# Patient Record
Sex: Male | Born: 1965 | Race: Black or African American | Hispanic: No | Smoking: Current every day smoker
Health system: Southern US, Community
[De-identification: ages and names within clinical notes are randomized; demographics above are authoritative.]

## PROBLEM LIST (undated history)

## (undated) DIAGNOSIS — R079 Chest pain, unspecified: Secondary | ICD-10-CM

## (undated) DIAGNOSIS — R51 Headache: Secondary | ICD-10-CM

## (undated) DIAGNOSIS — J449 Chronic obstructive pulmonary disease, unspecified: Secondary | ICD-10-CM

## (undated) DIAGNOSIS — E7800 Pure hypercholesterolemia, unspecified: Secondary | ICD-10-CM

## (undated) DIAGNOSIS — I10 Essential (primary) hypertension: Secondary | ICD-10-CM

## (undated) DIAGNOSIS — G43909 Migraine, unspecified, not intractable, without status migrainosus: Secondary | ICD-10-CM

## (undated) DIAGNOSIS — R519 Headache, unspecified: Secondary | ICD-10-CM

## (undated) DIAGNOSIS — F1010 Alcohol abuse, uncomplicated: Secondary | ICD-10-CM

## (undated) HISTORY — PX: NO PAST SURGERIES: SHX2092

## (undated) HISTORY — DX: Chest pain, unspecified: R07.9

---

## 2001-06-29 ENCOUNTER — Emergency Department (HOSPITAL_COMMUNITY): Admission: EM | Admit: 2001-06-29 | Discharge: 2001-06-29 | Payer: Self-pay | Admitting: *Deleted

## 2002-06-01 ENCOUNTER — Emergency Department (HOSPITAL_COMMUNITY): Admission: EM | Admit: 2002-06-01 | Discharge: 2002-06-01 | Payer: Self-pay | Admitting: Emergency Medicine

## 2002-11-25 ENCOUNTER — Emergency Department (HOSPITAL_COMMUNITY): Admission: EM | Admit: 2002-11-25 | Discharge: 2002-11-25 | Payer: Self-pay | Admitting: Emergency Medicine

## 2006-01-28 ENCOUNTER — Emergency Department (HOSPITAL_COMMUNITY): Admission: EM | Admit: 2006-01-28 | Discharge: 2006-01-29 | Payer: Self-pay | Admitting: Emergency Medicine

## 2007-01-22 ENCOUNTER — Ambulatory Visit: Payer: Self-pay | Admitting: Internal Medicine

## 2007-01-26 ENCOUNTER — Ambulatory Visit: Payer: Self-pay | Admitting: *Deleted

## 2008-09-25 ENCOUNTER — Emergency Department (HOSPITAL_COMMUNITY): Admission: EM | Admit: 2008-09-25 | Discharge: 2008-09-25 | Payer: Self-pay | Admitting: Family Medicine

## 2014-07-07 ENCOUNTER — Encounter (HOSPITAL_COMMUNITY): Payer: Self-pay | Admitting: Emergency Medicine

## 2014-07-07 ENCOUNTER — Emergency Department (HOSPITAL_COMMUNITY)
Admission: EM | Admit: 2014-07-07 | Discharge: 2014-07-07 | Disposition: A | Discharge status: Home / Self Care | Payer: Self-pay | Attending: Emergency Medicine | Admitting: Emergency Medicine

## 2014-07-07 ENCOUNTER — Emergency Department (HOSPITAL_COMMUNITY): Payer: Self-pay

## 2014-07-07 DIAGNOSIS — Z720 Tobacco use: Secondary | ICD-10-CM | POA: Insufficient documentation

## 2014-07-07 DIAGNOSIS — R0789 Other chest pain: Secondary | ICD-10-CM | POA: Insufficient documentation

## 2014-07-07 DIAGNOSIS — R6883 Chills (without fever): Secondary | ICD-10-CM | POA: Insufficient documentation

## 2014-07-07 DIAGNOSIS — K529 Noninfective gastroenteritis and colitis, unspecified: Secondary | ICD-10-CM | POA: Insufficient documentation

## 2014-07-07 LAB — URINALYSIS, ROUTINE W REFLEX MICROSCOPIC
BILIRUBIN URINE: NEGATIVE
Glucose, UA: NEGATIVE mg/dL
HGB URINE DIPSTICK: NEGATIVE
Ketones, ur: NEGATIVE mg/dL
Leukocytes, UA: NEGATIVE
Nitrite: NEGATIVE
PH: 5.5 (ref 5.0–8.0)
Protein, ur: NEGATIVE mg/dL
SPECIFIC GRAVITY, URINE: 1.011 (ref 1.005–1.030)
Urobilinogen, UA: 0.2 mg/dL (ref 0.0–1.0)

## 2014-07-07 LAB — COMPREHENSIVE METABOLIC PANEL
ALBUMIN: 3.6 g/dL (ref 3.5–5.2)
ALK PHOS: 75 U/L (ref 39–117)
ALT: 17 U/L (ref 0–53)
AST: 30 U/L (ref 0–37)
Anion gap: 11 (ref 5–15)
BILIRUBIN TOTAL: 0.3 mg/dL (ref 0.3–1.2)
BUN: 14 mg/dL (ref 6–23)
CHLORIDE: 103 meq/L (ref 96–112)
CO2: 27 mEq/L (ref 19–32)
Calcium: 9.2 mg/dL (ref 8.4–10.5)
Creatinine, Ser: 0.78 mg/dL (ref 0.50–1.35)
GFR calc Af Amer: >90 mL/min (ref >90)
GFR calc non Af Amer: >90 mL/min (ref >90)
Glucose, Bld: 95 mg/dL (ref 70–99)
POTASSIUM: 5.1 meq/L (ref 3.7–5.3)
SODIUM: 141 meq/L (ref 137–147)
Total Protein: 7 g/dL (ref 6.0–8.3)

## 2014-07-07 LAB — CBC WITH DIFFERENTIAL/PLATELET
BASOS ABS: 0.1 10*3/uL (ref 0.0–0.1)
BASOS PCT: 1 % (ref 0–1)
Eosinophils Absolute: 0.4 10*3/uL (ref 0.0–0.7)
Eosinophils Relative: 4 % (ref 0–5)
HCT: 38.9 % — ABNORMAL LOW (ref 39.0–52.0)
HEMOGLOBIN: 13.5 g/dL (ref 13.0–17.0)
Lymphocytes Relative: 29 % (ref 12–46)
Lymphs Abs: 2.6 10*3/uL (ref 0.7–4.0)
MCH: 31.9 pg (ref 26.0–34.0)
MCHC: 34.7 g/dL (ref 30.0–36.0)
MCV: 92 fL (ref 78.0–100.0)
MONOS PCT: 10 % (ref 3–12)
Monocytes Absolute: 0.9 10*3/uL (ref 0.1–1.0)
NEUTROS ABS: 5.1 10*3/uL (ref 1.7–7.7)
NEUTROS PCT: 56 % (ref 43–77)
PLATELETS: 273 10*3/uL (ref 150–400)
RBC: 4.23 MIL/uL (ref 4.22–5.81)
RDW: 13.1 % (ref 11.5–15.5)
WBC: 9 10*3/uL (ref 4.0–10.5)

## 2014-07-07 LAB — LIPASE, BLOOD: Lipase: 32 U/L (ref 11–59)

## 2014-07-07 MED ORDER — HYDROMORPHONE HCL 1 MG/ML IJ SOLN
1.0000 mg | Freq: Once | INTRAMUSCULAR | Status: AC
  Administered 2014-07-07: 1 mg via INTRAVENOUS
  Filled 2014-07-07: qty 1

## 2014-07-07 MED ORDER — HYDROCODONE-ACETAMINOPHEN 5-325 MG PO TABS: 1.0000 | ORAL_TABLET | Freq: Four times a day (QID) | ORAL | Status: DC | PRN

## 2014-07-07 MED ORDER — IOHEXOL 300 MG/ML  SOLN
25.0000 mL | Freq: Once | INTRAMUSCULAR | Status: AC | PRN
Start: 2014-07-07 — End: 2014-07-07
  Administered 2014-07-07: 25 mL via ORAL

## 2014-07-07 MED ORDER — CIPROFLOXACIN HCL 500 MG PO TABS: 500.0000 mg | ORAL_TABLET | Freq: Two times a day (BID) | ORAL | Status: DC

## 2014-07-07 MED ORDER — SODIUM CHLORIDE 0.9 % IV BOLUS (SEPSIS)
1000.0000 mL | Freq: Once | INTRAVENOUS | Status: AC
  Administered 2014-07-07: 1000 mL via INTRAVENOUS

## 2014-07-07 MED ORDER — ONDANSETRON HCL 4 MG/2ML IJ SOLN
4.0000 mg | Freq: Once | INTRAMUSCULAR | Status: AC
  Administered 2014-07-07: 4 mg via INTRAVENOUS
  Filled 2014-07-07: qty 2

## 2014-07-07 MED ORDER — METRONIDAZOLE 500 MG PO TABS: 500.0000 mg | ORAL_TABLET | Freq: Two times a day (BID) | ORAL | Status: DC

## 2014-07-07 MED ORDER — IOHEXOL 300 MG/ML  SOLN
80.0000 mL | Freq: Once | INTRAMUSCULAR | Status: AC | PRN
  Administered 2014-07-07: 80 mL via INTRAVENOUS

## 2014-07-07 MED ORDER — METRONIDAZOLE 500 MG PO TABS
500.0000 mg | ORAL_TABLET | Freq: Once | ORAL | Status: AC
  Administered 2014-07-07: 500 mg via ORAL
  Filled 2014-07-07: qty 1

## 2014-07-07 MED ORDER — CIPROFLOXACIN HCL 500 MG PO TABS
500.0000 mg | ORAL_TABLET | Freq: Once | ORAL | Status: AC
  Administered 2014-07-07: 500 mg via ORAL
  Filled 2014-07-07: qty 1

## 2014-07-07 NOTE — Discharge Planning (Signed)
[place] Specialist  Spoke to patient regarding primary care resources and establishing care with a provider. Resource guide and my contact information provided for any future questions or concerns. No other [place] Specialist needs identified at this time.

## 2014-07-07 NOTE — Discharge Instructions (Signed)

## 2014-07-07 NOTE — ED Notes (Signed)
Pt. reports mid/low abdominal pain with emesis and diarrhea onset Wednesday this week , denies fever or chills. No dysuria .

## 2014-07-07 NOTE — ED Provider Notes (Signed)
CSN: [id number]     Arrival date & time 07/07/14  0606 History   First MD Initiated Contact with Patient 07/07/14 [phone]     Chief Complaint  Patient presents with  . Abdominal Pain     (Consider location/radiation/quality/duration/timing/severity/associated sxs/prior Treatment) HPI Patient presents with new abdominal pain.  Onset was 2 days ago, without clear precipitant. Since onset patient has a severe pain, worse with exacerbations.  The pain is focally in the lower abdomen, though it radiates diffusely. No relief with Pepto-Bismol or other OTC medication. There is associated chills, one episode of emesis on the day of symptoms onset, though none since. There is associated diarrhea, yellow. No new dyspnea. Occasionally patient also generalized discomfort, including 2 chest discomfort, though not consistently, nor with exertion or deep inspiration.  Patient works as a Training and development officer in [employer].  He smokes, drinks.  I discussed smoking cessation with the patient.   History reviewed. No pertinent past medical history. History reviewed. No pertinent past surgical history. No family history on file. History  Substance Use Topics  . Smoking status: Current Every Day Smoker  . Smokeless tobacco: Not on file  . Alcohol Use: Yes    Review of Systems  Constitutional:       Per HPI, otherwise negative  HENT:       Per HPI, otherwise negative  Respiratory:       Per HPI, otherwise negative  Cardiovascular:       Per HPI, otherwise negative  Gastrointestinal: Positive for nausea, vomiting, abdominal pain and diarrhea. Negative for blood in stool.  Endocrine:       Negative aside from HPI  Genitourinary:       Neg aside from HPI   Musculoskeletal:       Per HPI, otherwise negative  Skin: Negative.   Neurological: Negative for syncope.      Allergies  Review of patient's allergies indicates no known allergies.  Home Medications   Prior to Admission medications   Not on  File   BP 182/87  Pulse 71  Temp(Src) 97.7 F (36.5 C) (Oral)  Resp 14  Ht 5\' 9"  (1.753 m)  Wt 160 lb (72.576 kg)  BMI 23.62 kg/m2  SpO2 100% Physical Exam  Nursing note and vitals reviewed. Constitutional: He is oriented to person, place, and time. He appears well-developed. No distress.  HENT:  Head: Normocephalic and atraumatic.  Eyes: Conjunctivae and EOM are normal.  Cardiovascular: Normal rate and regular rhythm.   Pulmonary/Chest: Effort normal. No stridor. No respiratory distress.  Abdominal: He exhibits no distension. There is generalized tenderness. There is guarding. There is no rigidity and no rebound.  Musculoskeletal: He exhibits no edema.  Neurological: He is alert and oriented to person, place, and time.  Skin: Skin is warm and dry.  Psychiatric: He has a normal mood and affect.    ED Course  Procedures (including critical care time) Labs Review Labs Reviewed  CBC WITH DIFFERENTIAL - Abnormal; Notable for the following:    HCT 38.9 (*)    All other components within normal limits  COMPREHENSIVE METABOLIC PANEL  LIPASE, BLOOD  URINALYSIS, ROUTINE W REFLEX MICROSCOPIC    Imaging Review Ct Abdomen Pelvis W Contrast  07/07/2014   CLINICAL DATA:  Two day history of bilateral lower abdominal pain and diarrhea  EXAM: CT ABDOMEN AND PELVIS WITH CONTRAST  TECHNIQUE: Multidetector CT imaging of the abdomen and pelvis was performed using the standard protocol following bolus administration of intravenous  contrast. Oral contrast was also administered.  CONTRAST:  63mL OMNIPAQUE IOHEXOL 300 MG/ML  SOLN  COMPARISON:  None.  FINDINGS: Lung bases are clear.  There is a small hiatal hernia.  Liver is prominent measuring 17.8 cm in length. There is a 4 mm presumed cyst in the anterior segment right lobe of the liver. No other focal liver lesion is identified. Gallbladder wall is not thickened. There is no biliary duct dilatation.  Spleen, pancreas, and adrenals appear normal.   Kidneys bilaterally show no mass, calculus, or hydronephrosis on either side. There is no ureteral calculus or ureterectasis on either side.  The urinary bladder is distended. The urinary bladder wall thickness is normal. There is a small amount of free fluid in the dependent portion of the pelvis. There is no pelvic mass. The appendix appears normal.  There is no bowel obstruction.  No free air or portal venous air.  There is mild fold thickening at several sites in the proximal transverse colon and proximal descending colon consistent with nonspecific colitis. There is no appreciable small bowel thickening.  There is no adenopathy or abscess in the abdomen or pelvis. There is atherosclerotic change in the aorta and iliac arteries. There is no appreciable abdominal aortic aneurysm. There are no blastic or lytic bone lesions.  IMPRESSION: Areas of relatively mild colonic wall thickening, probably representing nonspecific colitis. There is no surrounding mesenteric thickening or abscess. No fistulae are seen. No small bowel dilatation. No bowel obstruction. Appendix appears normal.  Small amount of ascites in the dependent portion of the pelvis. No other ascites.  Liver prominent. No focal liver lesions beyond a tiny presumed cyst in the right lobe.  Small hiatal hernia.  Urinary bladder is distended. Clinical significance of this finding is uncertain. Prostate does not appear enlarged on this study.   Electronically Signed   By: Lowella Grip M.D.   On: 07/07/2014 10:43     EKG Interpretation   Date/Time:  Thursday July 07 2014 18:84:16 EDT Ventricular Rate:  52 PR Interval:  149 QRS Duration: 94 QT Interval:  467 QTC Calculation: 434 R Axis:   79 Text Interpretation:  Sinus rhythm Left ventricular hypertrophy Inferior  infarct, acute (LCx) Lateral leads are also involved Sinus rhythm Left  ventricular hypertrophy diffuse ST changes consistent with prior study  Abnormal ekg Confirmed by  Carmin Muskrat  MD ([id number]) on 07/07/2014 7:31:06  AM     12:07 PM Patient appears calm on repeat exam.  He is aware of all results. MDM   Patient presents with new lower abdominal pain, diarrhea.  The patient has an abnormal ECG, but consistent with prior studies.  No CP throughout the patient's ED course. Pain improved. CT c/w colitis, which is consistent with the patient's Sx. Absent f/c, distress, the patient was d/c w ABX, analgesics, GI f/u.    Carmin Muskrat, MD 07/07/14 1209

## 2014-07-07 NOTE — ED Notes (Signed)
No symptoms of diarrhea since pt.s arrival

## 2014-07-25 ENCOUNTER — Emergency Department (HOSPITAL_COMMUNITY)
Admission: EM | Admit: 2014-07-25 | Discharge: 2014-07-25 | Disposition: A | Discharge status: Home / Self Care | Payer: Self-pay | Attending: Emergency Medicine | Admitting: Emergency Medicine

## 2014-07-25 ENCOUNTER — Emergency Department (HOSPITAL_COMMUNITY): Payer: Self-pay

## 2014-07-25 ENCOUNTER — Encounter (HOSPITAL_COMMUNITY): Payer: Self-pay | Admitting: Emergency Medicine

## 2014-07-25 DIAGNOSIS — Z720 Tobacco use: Secondary | ICD-10-CM | POA: Insufficient documentation

## 2014-07-25 DIAGNOSIS — Z8719 Personal history of other diseases of the digestive system: Secondary | ICD-10-CM | POA: Insufficient documentation

## 2014-07-25 DIAGNOSIS — R61 Generalized hyperhidrosis: Secondary | ICD-10-CM | POA: Insufficient documentation

## 2014-07-25 DIAGNOSIS — R112 Nausea with vomiting, unspecified: Secondary | ICD-10-CM | POA: Insufficient documentation

## 2014-07-25 DIAGNOSIS — M79605 Pain in left leg: Secondary | ICD-10-CM | POA: Insufficient documentation

## 2014-07-25 DIAGNOSIS — Z7982 Long term (current) use of aspirin: Secondary | ICD-10-CM | POA: Insufficient documentation

## 2014-07-25 DIAGNOSIS — R0789 Other chest pain: Secondary | ICD-10-CM | POA: Insufficient documentation

## 2014-07-25 DIAGNOSIS — R1012 Left upper quadrant pain: Secondary | ICD-10-CM | POA: Insufficient documentation

## 2014-07-25 LAB — CBC
HCT: 37.8 % — ABNORMAL LOW (ref 39.0–52.0)
HEMOGLOBIN: 13.2 g/dL (ref 13.0–17.0)
MCH: 32 pg (ref 26.0–34.0)
MCHC: 34.9 g/dL (ref 30.0–36.0)
MCV: 91.7 fL (ref 78.0–100.0)
Platelets: 259 10*3/uL (ref 150–400)
RBC: 4.12 MIL/uL — AB (ref 4.22–5.81)
RDW: 12.7 % (ref 11.5–15.5)
WBC: 6.7 10*3/uL (ref 4.0–10.5)

## 2014-07-25 LAB — LIPASE, BLOOD: Lipase: 42 U/L (ref 11–59)

## 2014-07-25 LAB — HEPATIC FUNCTION PANEL
ALT: 21 U/L (ref 0–53)
AST: 41 U/L — ABNORMAL HIGH (ref 0–37)
Albumin: 3.8 g/dL (ref 3.5–5.2)
Alkaline Phosphatase: 67 U/L (ref 39–117)
Bilirubin, Direct: <0.2 mg/dL (ref 0.0–0.3)
Total Bilirubin: 0.4 mg/dL (ref 0.3–1.2)
Total Protein: 7.2 g/dL (ref 6.0–8.3)

## 2014-07-25 LAB — I-STAT TROPONIN, ED: Troponin i, poc: 0.01 ng/mL (ref 0.00–0.08)

## 2014-07-25 LAB — POC OCCULT BLOOD, ED: FECAL OCCULT BLD: NEGATIVE

## 2014-07-25 LAB — PRO B NATRIURETIC PEPTIDE: PRO B NATRI PEPTIDE: 43.1 pg/mL (ref 0–125)

## 2014-07-25 LAB — BASIC METABOLIC PANEL
ANION GAP: 14 (ref 5–15)
BUN: 13 mg/dL (ref 6–23)
CALCIUM: 9.3 mg/dL (ref 8.4–10.5)
CHLORIDE: 97 meq/L (ref 96–112)
CO2: 25 meq/L (ref 19–32)
CREATININE: 0.87 mg/dL (ref 0.50–1.35)
GFR calc Af Amer: >90 mL/min (ref >90)
GFR calc non Af Amer: >90 mL/min (ref >90)
GLUCOSE: 102 mg/dL — AB (ref 70–99)
Potassium: 4.2 mEq/L (ref 3.7–5.3)
SODIUM: 136 meq/L — AB (ref 137–147)

## 2014-07-25 LAB — TROPONIN I

## 2014-07-25 MED ORDER — GI COCKTAIL ~~LOC~~
30.0000 mL | Freq: Once | ORAL | Status: AC
  Administered 2014-07-25: 30 mL via ORAL
  Filled 2014-07-25: qty 30

## 2014-07-25 MED ORDER — LANSOPRAZOLE 30 MG PO CPDR: 30.0000 mg | DELAYED_RELEASE_CAPSULE | Freq: Every day | ORAL | Status: DC

## 2014-07-25 NOTE — ED Notes (Signed)
Pending lab results, NAD, calm, resting, denies change, "feels the same".

## 2014-07-25 NOTE — ED Notes (Signed)
C/o L axilla pain, radiates across L chest and down L side down to foot, also states, "L hand and foot/toes cramping". Admits to some sob (denies: nvd, fever, cough, congestion, cold sx or dizziness).  Alert, NAD, calm, interactive, no dyspnea noted.

## 2014-07-25 NOTE — ED Provider Notes (Signed)
CSN: [id number]     Arrival date & time 07/25/14  0217 History   First MD Initiated Contact with Patient 07/25/14 [phone]     Chief Complaint  Patient presents with  . Chest Pain  . Abdominal Pain  . Leg Pain     (Consider location/radiation/quality/duration/timing/severity/associated sxs/prior Treatment) HPI Patient was seen and treated for colitis roughly 3 weeks ago. He states that all medication as prescribed. Roughly one week of diagnosis began having left-sided abdominal pain that radiated up into the left chest and down the left leg. He's had mild nausea and episodic vomiting. Vomiting is nonbloody and nonbilious. Patient does admit to dark stools with occasional gross blood. He denies any fevers or chills. Patient with a urinary symptoms including hematuria, dysuria, frequency or urgency. Patient admits to heavy alcohol consumption as well as Ibuprofen and Goody powders. No lower extremity swelling or pain. No recent extended travel or surgeries. History reviewed. No pertinent past medical history. History reviewed. No pertinent past surgical history. No family history on file. History  Substance Use Topics  . Smoking status: Current Every Day Smoker  . Smokeless tobacco: Not on file  . Alcohol Use: Yes    Review of Systems  Constitutional: Positive for diaphoresis. Negative for fever and chills.  Respiratory: Negative for cough and shortness of breath.   Cardiovascular: Positive for chest pain. Negative for palpitations and leg swelling.  Gastrointestinal: Positive for nausea, vomiting, abdominal pain and anal bleeding. Negative for diarrhea, constipation and blood in stool.  Genitourinary: Negative for dysuria and frequency.  Musculoskeletal: Negative for back pain, myalgias, neck pain and neck stiffness.  Skin: Negative for rash and wound.  Neurological: Negative for dizziness, seizures, weakness, numbness and headaches.  All other systems reviewed and are  negative.     Allergies  Review of patient's allergies indicates no known allergies.  Home Medications   Prior to Admission medications   Medication Sig Start Date End Date Taking? Authorizing Provider  aspirin EC 81 MG tablet Take 162 mg by mouth every 6 (six) hours as needed for mild pain.   Yes Historical Provider, MD  lansoprazole (PREVACID) 30 MG capsule Take 1 capsule (30 mg total) by mouth daily at 12 noon. 07/25/14   Julianne Rice, MD   BP 105/80  Pulse 64  Temp(Src) 99 F (37.2 C) (Oral)  Resp 22  Ht 5\' 9"  (1.753 m)  Wt 145 lb (65.772 kg)  BMI 21.40 kg/m2  SpO2 90% Physical Exam  Nursing note and vitals reviewed. Constitutional: He is oriented to person, place, and time. He appears well-developed and well-nourished. No distress.  HENT:  Head: Normocephalic and atraumatic.  Mouth/Throat: Oropharynx is clear and moist. No oropharyngeal exudate.  Eyes: EOM are normal. Pupils are equal, round, and reactive to light.  Neck: Normal range of motion. Neck supple.  Cardiovascular: Normal rate and regular rhythm.   Pulmonary/Chest: Effort normal and breath sounds normal. No respiratory distress. He has no wheezes. He has no rales. He exhibits tenderness (chest tenderness is completely reproduced with palpation over the left chest wall. There is no crepitance or deformity.).  Abdominal: Soft. Bowel sounds are normal. There is tenderness (mild left upper abdomen tenderness with palpation. No rebound or guarding.).  Musculoskeletal: Normal range of motion. He exhibits no edema and no tenderness.  Neurological: He is alert and oriented to person, place, and time.  Skin: Skin is warm and dry. No rash noted. No erythema.  Psychiatric: He has a normal mood  and affect. His behavior is normal.    ED Course  Procedures (including critical care time) Labs Review Labs Reviewed  CBC - Abnormal; Notable for the following:    RBC 4.12 (*)    HCT 37.8 (*)    All other components  within normal limits  BASIC METABOLIC PANEL - Abnormal; Notable for the following:    Sodium 136 (*)    Glucose, Bld 102 (*)    All other components within normal limits  HEPATIC FUNCTION PANEL - Abnormal; Notable for the following:    AST 41 (*)    All other components within normal limits  PRO B NATRIURETIC PEPTIDE  LIPASE, BLOOD  TROPONIN I  I-STAT TROPOININ, ED  POC OCCULT BLOOD, ED    Imaging Review Dg Chest 2 View  07/25/2014   CLINICAL DATA:  LEFT lateral chest pain, and painful swallowing beginning July 18, 2014.  EXAM: CHEST  2 VIEW  COMPARISON:  Chest radiograph September 25, 2008  FINDINGS: Cardiomediastinal silhouette is unremarkable. The lungs are clear without pleural effusions or focal consolidations. Trachea projects midline and there is no pneumothorax. Soft tissue planes and included osseous structures are non-suspicious.  IMPRESSION: No acute cardiopulmonary process ; normal chest radiograph.   Electronically Signed   By: Elon Alas   On: 07/25/2014 02:56     EKG Interpretation None      MDM   Final diagnoses:  Chest wall pain  Left upper quadrant pain    Patient presents with atypical left-sided chest pain and left abdominal pain. States he's had a drinker and uses multiple NSAIDs. Suspect this is gastritis related. Chest pain is reproduced with palpation. EKG without acute changes. Troponin x2 is normal. Patient's workup is otherwise normal. Guaiac-negative. Advised to avoid NSAIDs and decrease alcohol consumption. Will also start on PPI. Patient will need to followup with gastroenterology. He can give her return precautions and is voiced understanding.    Julianne Rice, MD 07/26/14 [place]

## 2014-07-25 NOTE — Discharge Instructions (Signed)
Abdominal Pain Many things can cause abdominal pain. Usually, abdominal pain is not caused by a disease and will improve without treatment. It can often be observed and treated at home. Your health care provider will do a physical exam and possibly order blood tests and X-rays to help determine the seriousness of your pain. However, in many cases, more time must pass before a clear cause of the pain can be found. Before that point, your health care provider may not know if you need more testing or further treatment. HOME CARE INSTRUCTIONS  Monitor your abdominal pain for any changes. The following actions may help to alleviate any discomfort you are experiencing:  Only take over-the-counter or prescription medicines as directed by your health care provider.  Do not take laxatives unless directed to do so by your health care provider.  Try a clear liquid diet (broth, tea, or water) as directed by your health care provider. Slowly move to a bland diet as tolerated. SEEK MEDICAL CARE IF:  You have unexplained abdominal pain.  You have abdominal pain associated with nausea or diarrhea.  You have pain when you urinate or have a bowel movement.  You experience abdominal pain that wakes you in the night.  You have abdominal pain that is worsened or improved by eating food.  You have abdominal pain that is worsened with eating fatty foods.  You have a fever. SEEK IMMEDIATE MEDICAL CARE IF:   Your pain does not go away within 2 hours.  You keep throwing up (vomiting).  Your pain is felt only in portions of the abdomen, such as the right side or the left lower portion of the abdomen.  You pass bloody or black tarry stools. MAKE SURE YOU:  Understand these instructions.   Will watch your condition.   Will get help right away if you are not doing well or get worse.  Document Released: 07/03/2005 Document Revised: 09/28/2013 Document Reviewed: 06/02/2013 [facility] Patient Information  2015 [place], [place]. This information is not intended to replace advice given to you by your health care provider. Make sure you discuss any questions you have with your health care provider.  Chest Wall Pain Chest wall pain is pain in or around the bones and muscles of your chest. It may take up to 6 weeks to get better. It may take longer if you must stay physically active in your work and activities.  CAUSES  Chest wall pain may happen on its own. However, it may be caused by:  A viral illness like the flu.  Injury.  Coughing.  Exercise.  Arthritis.  Fibromyalgia.  Shingles. HOME CARE INSTRUCTIONS   Avoid overtiring physical activity. Try not to strain or perform activities that cause pain. This includes any activities using your chest or your abdominal and side muscles, especially if heavy weights are used.  Put ice on the sore area.  Put ice in a plastic bag.  Place a towel between your skin and the bag.  Leave the ice on for 15-20 minutes per hour while awake for the first 2 days.  Only take over-the-counter or prescription medicines for pain, discomfort, or fever as directed by your caregiver. SEEK IMMEDIATE MEDICAL CARE IF:   Your pain increases, or you are very uncomfortable.  You have a fever.  Your chest pain becomes worse.  You have new, unexplained symptoms.  You have nausea or vomiting.  You feel sweaty or lightheaded.  You have a cough with phlegm (sputum), or you  cough up blood. MAKE SURE YOU:   Understand these instructions.  Will watch your condition.  Will get help right away if you are not doing well or get worse. Document Released: 09/23/2005 Document Revised: 12/16/2011 Document Reviewed: 05/20/2011 [facility] Patient Information 2015 [place], [place]. This information is not intended to replace advice given to you by your health care provider. Make sure you discuss any questions you have with your health care provider.

## 2014-07-25 NOTE — ED Notes (Signed)
Dr. Yelverton at [facility] 

## 2014-07-25 NOTE — ED Notes (Signed)
Reports pain that radiates from L shoulder to L toes.  States pain radiates into L side of chest, L side of abd, and down L leg.  Reports nausea, vomiting (in the mornings), sob, diaphoresis, night sweats, and dark tarry stools since 10/12.  Also reports feeling bloated after eating a small amount.

## 2014-07-25 NOTE — ED Notes (Signed)
pt alert, NAD, calm, interactive, resps e/u, speaking in clear complete sentences, steady gait, no dyspnea noted. Back to room from w/r.

## 2014-08-25 ENCOUNTER — Inpatient Hospital Stay: Payer: Self-pay | Admitting: Internal Medicine

## 2015-11-09 ENCOUNTER — Emergency Department (HOSPITAL_COMMUNITY)
Admission: EM | Admit: 2015-11-09 | Discharge: 2015-11-09 | Disposition: A | Discharge status: Home / Self Care | Payer: Self-pay | Attending: Emergency Medicine | Admitting: Emergency Medicine

## 2015-11-09 ENCOUNTER — Encounter (HOSPITAL_COMMUNITY): Payer: Self-pay

## 2015-11-09 ENCOUNTER — Emergency Department (HOSPITAL_COMMUNITY): Payer: Self-pay

## 2015-11-09 DIAGNOSIS — F17200 Nicotine dependence, unspecified, uncomplicated: Secondary | ICD-10-CM | POA: Insufficient documentation

## 2015-11-09 DIAGNOSIS — K529 Noninfective gastroenteritis and colitis, unspecified: Secondary | ICD-10-CM | POA: Insufficient documentation

## 2015-11-09 DIAGNOSIS — R1084 Generalized abdominal pain: Secondary | ICD-10-CM | POA: Insufficient documentation

## 2015-11-09 DIAGNOSIS — Z7982 Long term (current) use of aspirin: Secondary | ICD-10-CM | POA: Insufficient documentation

## 2015-11-09 DIAGNOSIS — Z79899 Other long term (current) drug therapy: Secondary | ICD-10-CM | POA: Insufficient documentation

## 2015-11-09 LAB — COMPREHENSIVE METABOLIC PANEL
ALT: 110 U/L — ABNORMAL HIGH (ref 17–63)
ANION GAP: 15 (ref 5–15)
AST: 166 U/L — ABNORMAL HIGH (ref 15–41)
Albumin: 3.7 g/dL (ref 3.5–5.0)
Alkaline Phosphatase: 85 U/L (ref 38–126)
BUN: 16 mg/dL (ref 6–20)
CO2: 25 mmol/L (ref 22–32)
Calcium: 9.4 mg/dL (ref 8.9–10.3)
Chloride: 99 mmol/L — ABNORMAL LOW (ref 101–111)
Creatinine, Ser: 1.46 mg/dL — ABNORMAL HIGH (ref 0.61–1.24)
GFR calc non Af Amer: 54 mL/min — ABNORMAL LOW (ref >60)
Glucose, Bld: 118 mg/dL — ABNORMAL HIGH (ref 65–99)
Potassium: 4.4 mmol/L (ref 3.5–5.1)
SODIUM: 139 mmol/L (ref 135–145)
TOTAL PROTEIN: 6.6 g/dL (ref 6.5–8.1)
Total Bilirubin: 1.6 mg/dL — ABNORMAL HIGH (ref 0.3–1.2)

## 2015-11-09 LAB — URINALYSIS, ROUTINE W REFLEX MICROSCOPIC
Bilirubin Urine: NEGATIVE
Glucose, UA: NEGATIVE mg/dL
HGB URINE DIPSTICK: NEGATIVE
Ketones, ur: NEGATIVE mg/dL
LEUKOCYTES UA: NEGATIVE
NITRITE: NEGATIVE
PROTEIN: 30 mg/dL — AB
SPECIFIC GRAVITY, URINE: 1.013 (ref 1.005–1.030)
pH: 6 (ref 5.0–8.0)

## 2015-11-09 LAB — URINE MICROSCOPIC-ADD ON

## 2015-11-09 LAB — CBC
HCT: 39.6 % (ref 39.0–52.0)
HEMOGLOBIN: 14 g/dL (ref 13.0–17.0)
MCH: 32.8 pg (ref 26.0–34.0)
MCHC: 35.4 g/dL (ref 30.0–36.0)
MCV: 92.7 fL (ref 78.0–100.0)
Platelets: 208 10*3/uL (ref 150–400)
RBC: 4.27 MIL/uL (ref 4.22–5.81)
RDW: 12.7 % (ref 11.5–15.5)
WBC: 7.9 10*3/uL (ref 4.0–10.5)

## 2015-11-09 LAB — LIPASE, BLOOD: Lipase: 25 U/L (ref 11–51)

## 2015-11-09 MED ORDER — SODIUM CHLORIDE 0.9 % IV BOLUS (SEPSIS)
2000.0000 mL | Freq: Once | INTRAVENOUS | Status: AC
  Administered 2015-11-09: 2000 mL via INTRAVENOUS

## 2015-11-09 MED ORDER — FENTANYL CITRATE (PF) 100 MCG/2ML IJ SOLN
100.0000 ug | Freq: Once | INTRAMUSCULAR | Status: AC
  Administered 2015-11-09: 100 ug via INTRAVENOUS
  Filled 2015-11-09: qty 2

## 2015-11-09 MED ORDER — PROMETHAZINE HCL 25 MG/ML IJ SOLN
25.0000 mg | Freq: Once | INTRAMUSCULAR | Status: AC
  Administered 2015-11-09: 25 mg via INTRAVENOUS
  Filled 2015-11-09: qty 1

## 2015-11-09 MED ORDER — PROMETHAZINE HCL 25 MG PO TABS: 25.0000 mg | ORAL_TABLET | Freq: Three times a day (TID) | ORAL | Status: DC | PRN

## 2015-11-09 MED ORDER — IOHEXOL 300 MG/ML  SOLN
100.0000 mL | Freq: Once | INTRAMUSCULAR | Status: AC | PRN
  Administered 2015-11-09: 100 mL via INTRAVENOUS

## 2015-11-09 NOTE — ED Provider Notes (Signed)
CSN: [id number]     Arrival date & time 11/09/15  1334 History   First MD Initiated Contact with Patient 11/09/15 1548     Chief Complaint  Patient presents with  . Emesis     (Consider location/radiation/quality/duration/timing/severity/associated sxs/prior Treatment) HPI Patient presents to the emergency department with nausea, vomiting, diarrhea and abdominal pain that started yesterday morning.  The patient states that he is a 24 hours worth of nausea, vomiting, diarrhea, also with abdominal pain this morning.  Patient states that he did not take any medications prior to arrival.  Patient states that palpation makes the abdominal pain worse.  The patient denies chest pain, shortness of breath, fever, weakness, dizziness, dysuria, incontinence, bloody stool, hematemesis, back pain, neck pain, edema, rash, or syncope.  The patient states that he is a fairly heavy drinker History reviewed. No pertinent past medical history. History reviewed. No pertinent past surgical history. No family history on file. Social History  Substance Use Topics  . Smoking status: Current Every Day Smoker  . Smokeless tobacco: None  . Alcohol Use: Yes    Review of Systems All other systems negative except as documented in the HPI. All pertinent positives and negatives as reviewed in the HPI.   Allergies  Review of patient's allergies indicates no known allergies.  Home Medications   Prior to Admission medications   Medication Sig Start Date End Date Taking? Authorizing Provider  aspirin EC 81 MG tablet Take 162 mg by mouth every 6 (six) hours as needed for mild pain.    Historical Provider, MD  lansoprazole (PREVACID) 30 MG capsule Take 1 capsule (30 mg total) by mouth daily at 12 noon. 07/25/14   Julianne Rice, MD   BP 177/91 mmHg  Pulse 48  Temp(Src) 97.8 F (36.6 C) (Oral)  Resp 16  Ht 5\' 8"  (1.727 m)  Wt 58.968 kg  BMI 19.77 kg/m2  SpO2 100% Physical Exam  Constitutional: He is oriented  to person, place, and time. He appears well-developed and well-nourished. No distress.  HENT:  Head: Normocephalic and atraumatic.  Mouth/Throat: Oropharynx is clear and moist.  Eyes: Pupils are equal, round, and reactive to light.  Neck: Normal range of motion. Neck supple.  Cardiovascular: Normal rate, regular rhythm and normal heart sounds.  Exam reveals no gallop and no friction rub.   No murmur heard. Pulmonary/Chest: Effort normal and breath sounds normal. No respiratory distress. He has no wheezes.  Abdominal: Soft. Normal appearance and bowel sounds are normal. He exhibits no distension. There is generalized tenderness. There is no rebound and no guarding.  Neurological: He is alert and oriented to person, place, and time. He exhibits normal muscle tone. Coordination normal.  Skin: Skin is warm and dry. No rash noted. No erythema.  Psychiatric: He has a normal mood and affect. His behavior is normal.  Nursing note and vitals reviewed.   ED Course  Procedures (including critical care time) Labs Review Labs Reviewed  COMPREHENSIVE METABOLIC PANEL - Abnormal; Notable for the following:    Chloride 99 (*)    Glucose, Bld 118 (*)    Creatinine, Ser 1.46 (*)    AST 166 (*)    ALT 110 (*)    Total Bilirubin 1.6 (*)    GFR calc non Af Amer 54 (*)    All other components within normal limits  URINALYSIS, ROUTINE W REFLEX MICROSCOPIC (NOT AT [facility]) - Abnormal; Notable for the following:    Color, Urine AMBER (*)  APPearance CLOUDY (*)    Protein, ur 30 (*)    All other components within normal limits  URINE MICROSCOPIC-ADD ON - Abnormal; Notable for the following:    Squamous Epithelial / LPF 0-5 (*)    Bacteria, UA RARE (*)    Casts HYALINE CASTS (*)    All other components within normal limits  LIPASE, BLOOD  CBC    Imaging Review Ct Abdomen Pelvis W Contrast  11/09/2015  CLINICAL DATA:  Nausea and vomiting for 1 day.  Initial encounter. EXAM: CT ABDOMEN AND PELVIS WITH  CONTRAST TECHNIQUE: Multidetector CT imaging of the abdomen and pelvis was performed using the standard protocol following bolus administration of intravenous contrast. CONTRAST:  100 mL OMNIPAQUE IOHEXOL 300 MG/ML  SOLN COMPARISON:  CT abdomen and pelvis 07/07/2014. FINDINGS: The lung bases are clear.  No pleural or pericardial effusion. The liver is diffusely low attenuating consistent with fatty infiltration. No focal liver lesion is identified. Small left renal cyst is noted. The right kidney is unremarkable. The spleen, adrenal glands and pancreas appear normal. The biliary tree is unremarkable. There is incomplete visualization of air in the area of the perineum. A small fluid collection is seen in the axial plane only to the right of midline and measures 1.6 x 0.9 cm on image 87. There is advanced for age aortoiliac atherosclerosis. No lymphadenopathy is identified. The stomach, small and large bowel and appendix appear normal. No bony abnormality is identified. IMPRESSION: Possible soft tissue gas in the perineum with a small fluid collection just to the right of midline. This area is incompletely imaged. Recommend clinical correlation with physical examination. No acute abnormality within the abdomen or pelvis. Fatty infiltration of the liver. Advanced for age atherosclerosis. Electronically Signed   By: Inge Rise M.D.   On: 11/09/2015 18:51   I have personally reviewed and evaluated these images and lab results as part of my medical decision-making.  Patient will be treated for gastroenteritis.  Patient is advised to slowly increase his fluid intake, rest as much possible.  Patient agrees the plan and all questions were answered.  He is feeling considerably better following IV fluids and medications.  Told to return here as needed  Dalia Heading, PA-C 11/09/15 [place] Liu, MD 11/10/15 [phone]

## 2015-11-09 NOTE — ED Notes (Signed)
Patient here with general aching and lower abdominal pain x 1 day, vomiting and diarrhea with same

## 2015-11-09 NOTE — Discharge Instructions (Signed)
Return here as needed.  Slowly increase her fluid intake, rest as much as possible    Food Choices to Help Relieve Diarrhea, Adult When you have diarrhea, the foods you eat and your eating habits are very important. Choosing the right foods and drinks can help relieve diarrhea. Also, because diarrhea can last up to 7 days, you need to replace lost fluids and electrolytes (such as sodium, potassium, and chloride) in order to help prevent dehydration.  WHAT GENERAL GUIDELINES DO I NEED TO FOLLOW?  Slowly drink 1 cup (8 oz) of fluid for each episode of diarrhea. If you are getting enough fluid, your urine will be clear or pale yellow.  Eat starchy foods. Some good choices include white rice, white toast, pasta, low-fiber cereal, baked potatoes (without the skin), saltine crackers, and bagels.  Avoid large servings of any cooked vegetables.  Limit fruit to two servings per day. A serving is  cup or 1 small piece.  Choose foods with less than 2 g of fiber per serving.  Limit fats to less than 8 tsp (38 g) per day.  Avoid fried foods.  Eat foods that have probiotics in them. Probiotics can be found in certain dairy products.  Avoid foods and beverages that may increase the speed at which food moves through the stomach and intestines (gastrointestinal tract). Things to avoid include:  High-fiber foods, such as dried fruit, raw fruits and vegetables, nuts, seeds, and whole grain foods.  Spicy foods and high-fat foods.  Foods and beverages sweetened with high-fructose corn syrup, honey, or sugar alcohols such as xylitol, sorbitol, and mannitol. WHAT FOODS ARE RECOMMENDED? Grains White rice. White, [place], or pita breads (fresh or toasted), including plain rolls, buns, or bagels. White pasta. Saltine, soda, or graham crackers. Pretzels. Low-fiber cereal. Cooked cereals made with water (such as cornmeal, farina, or cream cereals). Plain muffins. Matzo. Melba toast. Zwieback.   Vegetables Potatoes (without the skin). Strained tomato and vegetable juices. Most well-cooked and canned vegetables without seeds. Tender lettuce. Fruits Cooked or canned applesauce, apricots, cherries, fruit cocktail, grapefruit, peaches, pears, or plums. Fresh bananas, apples without skin, cherries, grapes, cantaloupe, grapefruit, peaches, oranges, or plums.  Meat and Other Protein Products Baked or boiled chicken. Eggs. Tofu. Fish. Seafood. Smooth peanut butter. Ground or well-cooked tender beef, ham, veal, lamb, pork, or poultry.  Dairy Plain yogurt, kefir, and unsweetened liquid yogurt. Lactose-free milk, buttermilk, or soy milk. Plain hard cheese. Beverages Sport drinks. Clear broths. Diluted fruit juices (except prune). Regular, caffeine-free sodas such as ginger ale. Water. Decaffeinated teas. Oral rehydration solutions. Sugar-free beverages not sweetened with sugar alcohols. Other Bouillon, broth, or soups made from recommended foods.  The items listed above may not be a complete list of recommended foods or beverages. Contact your dietitian for more options. WHAT FOODS ARE NOT RECOMMENDED? Grains Whole grain, whole wheat, bran, or rye breads, rolls, pastas, crackers, and cereals. Wild or brown rice. Cereals that contain more than 2 g of fiber per serving. Corn tortillas or taco shells. Cooked or dry oatmeal. Granola. Popcorn. Vegetables Raw vegetables. Cabbage, broccoli, Brussels sprouts, artichokes, baked beans, beet greens, corn, kale, legumes, peas, sweet potatoes, and yams. Potato skins. Cooked spinach and cabbage. Fruits Dried fruit, including raisins and dates. Raw fruits. Stewed or dried prunes. Fresh apples with skin, apricots, mangoes, pears, raspberries, and strawberries.  Meat and Other Protein Products Chunky peanut butter. Nuts and seeds. Beans and lentils. Berniece Salines.  Dairy High-fat cheeses. Milk, chocolate milk, and beverages made  with milk, such as milk shakes. Cream.  Ice cream. Sweets and Desserts Sweet rolls, doughnuts, and sweet breads. Pancakes and waffles. Fats and Oils Butter. Cream sauces. Margarine. Salad oils. Plain salad dressings. Olives. Avocados.  Beverages Caffeinated beverages (such as coffee, tea, soda, or energy drinks). Alcoholic beverages. Fruit juices with pulp. Prune juice. Soft drinks sweetened with high-fructose corn syrup or sugar alcohols. Other Coconut. Hot sauce. Chili powder. Mayonnaise. Gravy. Cream-based or milk-based soups.  The items listed above may not be a complete list of foods and beverages to avoid. Contact your dietitian for more information. WHAT SHOULD I DO IF I BECOME DEHYDRATED? Diarrhea can sometimes lead to dehydration. Signs of dehydration include dark urine and dry mouth and skin. If you think you are dehydrated, you should rehydrate with an oral rehydration solution. These solutions can be purchased at pharmacies, retail stores, or online.  Drink -1 cup (120-240 mL) of oral rehydration solution each time you have an episode of diarrhea. If drinking this amount makes your diarrhea worse, try drinking smaller amounts more often. For example, drink 1-3 tsp (5-15 mL) every 5-10 minutes.  A general rule for staying hydrated is to drink 1-2 L of fluid per day. Talk to your health care provider about the specific amount you should be drinking each day. Drink enough fluids to keep your urine clear or pale yellow.   This information is not intended to replace advice given to you by your health care provider. Make sure you discuss any questions you have with your health care provider.   Document Released: 12/14/2003 Document Revised: 10/14/2014 Document Reviewed: 08/16/2013 Elsevier Interactive Patient Education [employer].

## 2015-11-09 NOTE — ED Notes (Signed)
Pt left with all his belongings and ambulated out of the treatment area.  

## 2016-07-02 ENCOUNTER — Encounter (HOSPITAL_COMMUNITY): Payer: Self-pay

## 2016-07-02 ENCOUNTER — Emergency Department (HOSPITAL_COMMUNITY)
Admission: EM | Admit: 2016-07-02 | Discharge: 2016-07-02 | Disposition: A | Discharge status: Home / Self Care | Payer: BLUE CROSS/BLUE SHIELD | Attending: Emergency Medicine | Admitting: Emergency Medicine

## 2016-07-02 ENCOUNTER — Emergency Department (HOSPITAL_COMMUNITY): Payer: BLUE CROSS/BLUE SHIELD

## 2016-07-02 DIAGNOSIS — F17210 Nicotine dependence, cigarettes, uncomplicated: Secondary | ICD-10-CM | POA: Insufficient documentation

## 2016-07-02 DIAGNOSIS — Z7982 Long term (current) use of aspirin: Secondary | ICD-10-CM | POA: Insufficient documentation

## 2016-07-02 DIAGNOSIS — R6889 Other general symptoms and signs: Secondary | ICD-10-CM

## 2016-07-02 DIAGNOSIS — R0981 Nasal congestion: Secondary | ICD-10-CM | POA: Diagnosis not present

## 2016-07-02 DIAGNOSIS — R52 Pain, unspecified: Secondary | ICD-10-CM | POA: Insufficient documentation

## 2016-07-02 DIAGNOSIS — R05 Cough: Secondary | ICD-10-CM | POA: Diagnosis present

## 2016-07-02 LAB — URINE MICROSCOPIC-ADD ON

## 2016-07-02 LAB — COMPREHENSIVE METABOLIC PANEL
ALK PHOS: 85 U/L (ref 38–126)
ALT: 62 U/L (ref 17–63)
AST: 102 U/L — ABNORMAL HIGH (ref 15–41)
Albumin: 4 g/dL (ref 3.5–5.0)
Anion gap: 15 (ref 5–15)
BUN: 14 mg/dL (ref 6–20)
CALCIUM: 9.5 mg/dL (ref 8.9–10.3)
CO2: 24 mmol/L (ref 22–32)
Chloride: 98 mmol/L — ABNORMAL LOW (ref 101–111)
Creatinine, Ser: 0.92 mg/dL (ref 0.61–1.24)
Glucose, Bld: 124 mg/dL — ABNORMAL HIGH (ref 65–99)
Potassium: 5.5 mmol/L — ABNORMAL HIGH (ref 3.5–5.1)
SODIUM: 137 mmol/L (ref 135–145)
Total Bilirubin: 0.9 mg/dL (ref 0.3–1.2)
Total Protein: 7 g/dL (ref 6.5–8.1)

## 2016-07-02 LAB — CBC
HCT: 38.5 % — ABNORMAL LOW (ref 39.0–52.0)
HEMOGLOBIN: 13.2 g/dL (ref 13.0–17.0)
MCH: 32 pg (ref 26.0–34.0)
MCHC: 34.3 g/dL (ref 30.0–36.0)
MCV: 93.4 fL (ref 78.0–100.0)
PLATELETS: 246 10*3/uL (ref 150–400)
RBC: 4.12 MIL/uL — AB (ref 4.22–5.81)
RDW: 12.8 % (ref 11.5–15.5)
WBC: 5.8 10*3/uL (ref 4.0–10.5)

## 2016-07-02 LAB — URINALYSIS, ROUTINE W REFLEX MICROSCOPIC
Bilirubin Urine: NEGATIVE
Glucose, UA: NEGATIVE mg/dL
HGB URINE DIPSTICK: NEGATIVE
KETONES UR: NEGATIVE mg/dL
Leukocytes, UA: NEGATIVE
Nitrite: NEGATIVE
PROTEIN: 30 mg/dL — AB
Specific Gravity, Urine: 1.02 (ref 1.005–1.030)
pH: 5 (ref 5.0–8.0)

## 2016-07-02 LAB — INFLUENZA PANEL BY PCR (TYPE A & B)
H1N1FLUPCR: NOT DETECTED
INFLBPCR: NEGATIVE
Influenza A By PCR: NEGATIVE

## 2016-07-02 LAB — LIPASE, BLOOD: Lipase: 26 U/L (ref 11–51)

## 2016-07-02 MED ORDER — ALBUTEROL SULFATE HFA 108 (90 BASE) MCG/ACT IN AERS: 2.0000 | INHALATION_SPRAY | Freq: Once | RESPIRATORY_TRACT | Status: DC

## 2016-07-02 MED ORDER — ONDANSETRON HCL 4 MG/2ML IJ SOLN
4.0000 mg | Freq: Once | INTRAMUSCULAR | Status: AC
  Administered 2016-07-02: 4 mg via INTRAVENOUS
  Filled 2016-07-02: qty 2

## 2016-07-02 MED ORDER — IBUPROFEN 400 MG PO TABS: 400.0000 mg | ORAL_TABLET | Freq: Three times a day (TID) | ORAL | 0 refills | Status: AC

## 2016-07-02 MED ORDER — IPRATROPIUM-ALBUTEROL 0.5-2.5 (3) MG/3ML IN SOLN
3.0000 mL | RESPIRATORY_TRACT | Status: DC
  Administered 2016-07-02: 3 mL via RESPIRATORY_TRACT
  Filled 2016-07-02: qty 3

## 2016-07-02 MED ORDER — SODIUM CHLORIDE 0.9 % IV BOLUS (SEPSIS)
1000.0000 mL | Freq: Once | INTRAVENOUS | Status: AC
  Administered 2016-07-02: 1000 mL via INTRAVENOUS

## 2016-07-02 NOTE — ED Provider Notes (Signed)
[facility] DEPT Provider Note   CSN: [id number] Arrival date & time: 07/02/16  0846     History   Chief Complaint Chief Complaint  Patient presents with  . Generalized Body Aches  . Emesis    HPI Steven Li is a 50 y.o. male.   Influenza  Presenting symptoms: cough, fatigue, myalgias, nausea, rhinorrhea and sore throat   Severity:  Mild Onset quality:  Gradual Duration:  3 days Progression:  Worsening Chronicity:  New Relieved by:  None tried Worsened by:  Nothing Ineffective treatments:  None tried Associated symptoms: chills, decreased appetite, decreased physical activity and nasal congestion     History reviewed. No pertinent past medical history.  There are no active problems to display for this patient.   History reviewed. No pertinent surgical history.     Home Medications    Prior to Admission medications   Medication Sig Start Date End Date Taking? Authorizing Provider  aspirin EC 81 MG tablet Take 162 mg by mouth every 6 (six) hours as needed for mild pain.    Historical Provider, MD  ibuprofen (ADVIL,MOTRIN) 400 MG tablet Take 1 tablet (400 mg total) by mouth 3 (three) times daily. 07/02/16 07/12/16  Merrily Pew, MD  lansoprazole (PREVACID) 30 MG capsule Take 1 capsule (30 mg total) by mouth daily at 12 noon. 07/25/14   Julianne Rice, MD  promethazine (PHENERGAN) 25 MG tablet Take 1 tablet (25 mg total) by mouth every 8 (eight) hours as needed for nausea or vomiting. 11/09/15   Dalia Heading, PA-C    Family History History reviewed. No pertinent family history.  Social History Social History  Substance Use Topics  . Smoking status: Current Every Day Smoker    Packs/day: 1.00    Types: Cigarettes  . Smokeless tobacco: Never Used  . Alcohol use 3.0 oz/week    5 Cans of beer per week     Allergies   Review of patient's allergies indicates no known allergies.   Review of Systems Review of Systems  Constitutional: Positive for  chills, decreased appetite and fatigue.  HENT: Positive for congestion, rhinorrhea and sore throat.   Respiratory: Positive for cough.   Gastrointestinal: Positive for nausea.  Musculoskeletal: Positive for myalgias.  All other systems reviewed and are negative.    Physical Exam Updated Vital Signs BP 138/75 (BP Location: Right Arm)   Pulse 75   Temp 98.8 F (37.1 C) (Oral)   Resp 18   Ht 5\' 8"  (1.727 m)   Wt 140 lb (63.5 kg)   SpO2 99%   BMI 21.29 kg/m   Physical Exam  Constitutional: He appears well-developed and well-nourished.  HENT:  Head: Normocephalic and atraumatic.  Eyes: Conjunctivae are normal.  Neck: Neck supple.  Cardiovascular: Normal rate and regular rhythm.   No murmur heard. Pulmonary/Chest: Effort normal. No respiratory distress. He has rhonchi. He has rales.  Abdominal: Soft. There is no tenderness.  Musculoskeletal: He exhibits deformity. He exhibits no edema.  Neurological: He is alert.  Skin: Skin is warm and dry.  Psychiatric: He has a normal mood and affect.  Nursing note and vitals reviewed.    ED Treatments / Results  Labs (all labs ordered are listed, but only abnormal results are displayed) Labs Reviewed  COMPREHENSIVE METABOLIC PANEL - Abnormal; Notable for the following:       Result Value   Potassium 5.5 (*)    Chloride 98 (*)    Glucose, Bld 124 (*)  AST 102 (*)    All other components within normal limits  CBC - Abnormal; Notable for the following:    RBC 4.12 (*)    HCT 38.5 (*)    All other components within normal limits  URINALYSIS, ROUTINE W REFLEX MICROSCOPIC (NOT AT [facility]) - Abnormal; Notable for the following:    Protein, ur 30 (*)    All other components within normal limits  URINE MICROSCOPIC-ADD ON - Abnormal; Notable for the following:    Squamous Epithelial / LPF 0-5 (*)    Bacteria, UA RARE (*)    Casts HYALINE CASTS (*)    All other components within normal limits  LIPASE, BLOOD  INFLUENZA PANEL BY PCR  (TYPE A & B, H1N1)    EKG  EKG Interpretation  Date/Time:  Tuesday July 02 2016 14:05:01 EDT Ventricular Rate:  77 PR Interval:    QRS Duration: 94 QT Interval:  426 QTC Calculation: 483 R Axis:   78 Text Interpretation:  Sinus arrhythmia Consider left ventricular hypertrophy Borderline prolonged QT interval diffuse ST elevation similar to 07/25/2014 Confirmed by [facility] MD, Corene Cornea [phone]) on 07/02/2016 2:11:46 PM       Radiology Dg Chest 2 View  Result Date: 07/02/2016 CLINICAL DATA:  Cough, congestion and body aches. EXAM: CHEST  2 VIEW COMPARISON:  07/25/2014 FINDINGS: The cardiac silhouette, mediastinal and hilar contours are normal and stable. The lungs are clear. No pleural effusion. No pulmonary lesions. The bony thorax is intact. IMPRESSION: Normal chest x-ray. Electronically Signed   By: Marijo Sanes M.D.   On: 07/02/2016 11:49    Procedures Procedures (including critical care time)  Medications Ordered in ED Medications  ondansetron (ZOFRAN) injection 4 mg (4 mg Intravenous Given 07/02/16 1204)  sodium chloride 0.9 % bolus 1,000 mL (0 mLs Intravenous Stopped 07/02/16 1357)     Initial Impression / Assessment and Plan / ED Course  I have reviewed the triage vital signs and the nursing notes.  Pertinent labs & imaging results that were available during my care of the patient were reviewed by me and considered in my medical decision making (see chart for details).  Clinical Course    Likely viral illness. Influenza negative. Will do supportive care otherwise. No e/o pneumonia or other indciation for antibiotics.   Final Clinical Impressions(s) / ED Diagnoses   Final diagnoses:  Flu-like symptoms    New Prescriptions Discharge Medication List as of 07/02/2016  2:33 PM    START taking these medications   Details  ibuprofen (ADVIL,MOTRIN) 400 MG tablet Take 1 tablet (400 mg total) by mouth 3 (three) times daily., Starting Tue 07/02/2016, Until Fri 07/12/2016,  Print         Merrily Pew, MD 07/03/16 [id number]

## 2016-07-02 NOTE — ED Triage Notes (Signed)
Pt reports he has generalized body aches, n/v/d X2 days.

## 2016-09-30 ENCOUNTER — Emergency Department (HOSPITAL_COMMUNITY): Payer: BLUE CROSS/BLUE SHIELD

## 2016-09-30 ENCOUNTER — Inpatient Hospital Stay (HOSPITAL_COMMUNITY)
Admission: EM | Admit: 2016-09-30 | Discharge: 2016-10-01 | DRG: 433 | Discharge status: Against Medical Advice | Payer: BLUE CROSS/BLUE SHIELD | Attending: Internal Medicine | Admitting: Internal Medicine

## 2016-09-30 ENCOUNTER — Encounter (HOSPITAL_COMMUNITY): Payer: Self-pay | Admitting: Emergency Medicine

## 2016-09-30 DIAGNOSIS — K922 Gastrointestinal hemorrhage, unspecified: Secondary | ICD-10-CM | POA: Diagnosis not present

## 2016-09-30 DIAGNOSIS — F1010 Alcohol abuse, uncomplicated: Secondary | ICD-10-CM | POA: Diagnosis not present

## 2016-09-30 DIAGNOSIS — D62 Acute posthemorrhagic anemia: Secondary | ICD-10-CM | POA: Diagnosis not present

## 2016-09-30 DIAGNOSIS — K7290 Hepatic failure, unspecified without coma: Secondary | ICD-10-CM | POA: Diagnosis not present

## 2016-09-30 DIAGNOSIS — R109 Unspecified abdominal pain: Secondary | ICD-10-CM | POA: Diagnosis not present

## 2016-09-30 DIAGNOSIS — R740 Nonspecific elevation of levels of transaminase and lactic acid dehydrogenase [LDH]: Secondary | ICD-10-CM | POA: Diagnosis present

## 2016-09-30 DIAGNOSIS — F1020 Alcohol dependence, uncomplicated: Secondary | ICD-10-CM

## 2016-09-30 DIAGNOSIS — R000 Tachycardia, unspecified: Secondary | ICD-10-CM | POA: Diagnosis present

## 2016-09-30 DIAGNOSIS — B199 Unspecified viral hepatitis without hepatic coma: Secondary | ICD-10-CM | POA: Diagnosis not present

## 2016-09-30 DIAGNOSIS — R42 Dizziness and giddiness: Secondary | ICD-10-CM | POA: Diagnosis present

## 2016-09-30 DIAGNOSIS — F17210 Nicotine dependence, cigarettes, uncomplicated: Secondary | ICD-10-CM | POA: Diagnosis present

## 2016-09-30 DIAGNOSIS — F1210 Cannabis abuse, uncomplicated: Secondary | ICD-10-CM | POA: Diagnosis present

## 2016-09-30 DIAGNOSIS — Z7982 Long term (current) use of aspirin: Secondary | ICD-10-CM

## 2016-09-30 DIAGNOSIS — I700 Atherosclerosis of aorta: Secondary | ICD-10-CM | POA: Diagnosis present

## 2016-09-30 DIAGNOSIS — R7401 Elevation of levels of liver transaminase levels: Secondary | ICD-10-CM | POA: Diagnosis present

## 2016-09-30 DIAGNOSIS — Z681 Body mass index (BMI) 19 or less, adult: Secondary | ICD-10-CM | POA: Diagnosis not present

## 2016-09-30 DIAGNOSIS — K7010 Alcoholic hepatitis without ascites: Principal | ICD-10-CM | POA: Diagnosis present

## 2016-09-30 DIAGNOSIS — R64 Cachexia: Secondary | ICD-10-CM | POA: Diagnosis present

## 2016-09-30 DIAGNOSIS — R1110 Vomiting, unspecified: Secondary | ICD-10-CM

## 2016-09-30 DIAGNOSIS — D649 Anemia, unspecified: Secondary | ICD-10-CM | POA: Diagnosis not present

## 2016-09-30 DIAGNOSIS — K759 Inflammatory liver disease, unspecified: Secondary | ICD-10-CM | POA: Diagnosis not present

## 2016-09-30 DIAGNOSIS — R748 Abnormal levels of other serum enzymes: Secondary | ICD-10-CM | POA: Diagnosis present

## 2016-09-30 DIAGNOSIS — Z8379 Family history of other diseases of the digestive system: Secondary | ICD-10-CM

## 2016-09-30 DIAGNOSIS — D689 Coagulation defect, unspecified: Secondary | ICD-10-CM | POA: Diagnosis present

## 2016-09-30 DIAGNOSIS — E875 Hyperkalemia: Secondary | ICD-10-CM | POA: Diagnosis present

## 2016-09-30 DIAGNOSIS — R7989 Other specified abnormal findings of blood chemistry: Secondary | ICD-10-CM | POA: Diagnosis not present

## 2016-09-30 DIAGNOSIS — K219 Gastro-esophageal reflux disease without esophagitis: Secondary | ICD-10-CM | POA: Diagnosis present

## 2016-09-30 DIAGNOSIS — R531 Weakness: Secondary | ICD-10-CM | POA: Diagnosis present

## 2016-09-30 DIAGNOSIS — K529 Noninfective gastroenteritis and colitis, unspecified: Secondary | ICD-10-CM | POA: Diagnosis present

## 2016-09-30 HISTORY — DX: Alcohol abuse, uncomplicated: F10.10

## 2016-09-30 LAB — CBC
HEMATOCRIT: 37.7 % — AB (ref 39.0–52.0)
HEMOGLOBIN: 13.7 g/dL (ref 13.0–17.0)
MCH: 32.6 pg (ref 26.0–34.0)
MCHC: 36.3 g/dL — AB (ref 30.0–36.0)
MCV: 89.8 fL (ref 78.0–100.0)
Platelets: 218 10*3/uL (ref 150–400)
RBC: 4.2 MIL/uL — AB (ref 4.22–5.81)
RDW: 13.6 % (ref 11.5–15.5)
WBC: 6.3 10*3/uL (ref 4.0–10.5)

## 2016-09-30 LAB — COMPREHENSIVE METABOLIC PANEL
ALBUMIN: 3.6 g/dL (ref 3.5–5.0)
ALT: 728 U/L — ABNORMAL HIGH (ref 17–63)
ANION GAP: 12 (ref 5–15)
AST: 1657 U/L — ABNORMAL HIGH (ref 15–41)
Alkaline Phosphatase: 124 U/L (ref 38–126)
BUN: 6 mg/dL (ref 6–20)
CHLORIDE: 97 mmol/L — AB (ref 101–111)
CO2: 26 mmol/L (ref 22–32)
Calcium: 9 mg/dL (ref 8.9–10.3)
Creatinine, Ser: 1.05 mg/dL (ref 0.61–1.24)
GFR calc non Af Amer: >60 mL/min (ref >60)
GLUCOSE: 158 mg/dL — AB (ref 65–99)
POTASSIUM: 4.5 mmol/L (ref 3.5–5.1)
SODIUM: 135 mmol/L (ref 135–145)
Total Bilirubin: 3.9 mg/dL — ABNORMAL HIGH (ref 0.3–1.2)
Total Protein: 6.6 g/dL (ref 6.5–8.1)

## 2016-09-30 LAB — LIPASE, BLOOD: LIPASE: 30 U/L (ref 11–51)

## 2016-09-30 LAB — URINALYSIS, ROUTINE W REFLEX MICROSCOPIC
BILIRUBIN URINE: NEGATIVE
Glucose, UA: NEGATIVE mg/dL
Hgb urine dipstick: NEGATIVE
KETONES UR: NEGATIVE mg/dL
Leukocytes, UA: NEGATIVE
Nitrite: NEGATIVE
PH: 5 (ref 5.0–8.0)
PROTEIN: 30 mg/dL — AB
Specific Gravity, Urine: 1.016 (ref 1.005–1.030)

## 2016-09-30 LAB — PROTIME-INR
INR: 1.43
PROTHROMBIN TIME: 17.6 s — AB (ref 11.4–15.2)

## 2016-09-30 LAB — ACETAMINOPHEN LEVEL: Acetaminophen (Tylenol), Serum: <10 ug/mL — ABNORMAL LOW (ref 10–30)

## 2016-09-30 LAB — POC OCCULT BLOOD, ED: Fecal Occult Bld: NEGATIVE

## 2016-09-30 MED ORDER — MORPHINE SULFATE (PF) 4 MG/ML IV SOLN
4.0000 mg | Freq: Once | INTRAVENOUS | Status: AC
  Administered 2016-09-30: 4 mg via INTRAVENOUS
  Filled 2016-09-30: qty 1

## 2016-09-30 MED ORDER — ADULT MULTIVITAMIN W/MINERALS CH
1.0000 | ORAL_TABLET | Freq: Every day | ORAL | Status: DC
  Administered 2016-09-30 – 2016-10-01 (×2): 1 via ORAL
  Filled 2016-09-30: qty 1

## 2016-09-30 MED ORDER — LORAZEPAM 2 MG/ML IJ SOLN
0.0000 mg | Freq: Four times a day (QID) | INTRAMUSCULAR | Status: DC
  Administered 2016-09-30 – 2016-10-01 (×2): 2 mg via INTRAVENOUS
  Filled 2016-09-30 (×2): qty 1

## 2016-09-30 MED ORDER — DEXTROSE 5 % IV SOLN
15.0000 mg/kg/h | INTRAVENOUS | Status: DC
  Filled 2016-09-30: qty 150

## 2016-09-30 MED ORDER — SODIUM CHLORIDE 0.9 % IV SOLN: INTRAVENOUS | Status: DC

## 2016-09-30 MED ORDER — FOLIC ACID 1 MG PO TABS
1.0000 mg | ORAL_TABLET | Freq: Every day | ORAL | Status: DC
  Administered 2016-09-30 – 2016-10-01 (×2): 1 mg via ORAL
  Filled 2016-09-30 (×2): qty 1

## 2016-09-30 MED ORDER — IBUPROFEN 400 MG PO TABS
400.0000 mg | ORAL_TABLET | Freq: Three times a day (TID) | ORAL | Status: DC | PRN
  Administered 2016-09-30 – 2016-10-01 (×2): 400 mg via ORAL
  Filled 2016-09-30 (×2): qty 1

## 2016-09-30 MED ORDER — LORAZEPAM 1 MG PO TABS: 1.0000 mg | ORAL_TABLET | Freq: Four times a day (QID) | ORAL | Status: DC | PRN

## 2016-09-30 MED ORDER — DEXTROSE-NACL 5-0.45 % IV SOLN
INTRAVENOUS | Status: DC
  Administered 2016-09-30 (×2): via INTRAVENOUS

## 2016-09-30 MED ORDER — ENOXAPARIN SODIUM 40 MG/0.4ML ~~LOC~~ SOLN
40.0000 mg | SUBCUTANEOUS | Status: DC
  Filled 2016-09-30: qty 0.4

## 2016-09-30 MED ORDER — SODIUM CHLORIDE 0.9 % IV BOLUS (SEPSIS)
1000.0000 mL | Freq: Once | INTRAVENOUS | Status: AC
Start: 2016-09-30 — End: 2016-09-30
  Administered 2016-09-30: 1000 mL via INTRAVENOUS

## 2016-09-30 MED ORDER — ONDANSETRON HCL 4 MG/2ML IJ SOLN
4.0000 mg | Freq: Four times a day (QID) | INTRAMUSCULAR | Status: DC | PRN
  Administered 2016-09-30: 4 mg via INTRAVENOUS
  Filled 2016-09-30: qty 2

## 2016-09-30 MED ORDER — ONDANSETRON HCL 4 MG/2ML IJ SOLN
4.0000 mg | Freq: Once | INTRAMUSCULAR | Status: AC
  Administered 2016-09-30: 4 mg via INTRAVENOUS
  Filled 2016-09-30: qty 2

## 2016-09-30 MED ORDER — CIPROFLOXACIN IN D5W 400 MG/200ML IV SOLN
400.0000 mg | Freq: Once | INTRAVENOUS | Status: AC
  Administered 2016-09-30: 400 mg via INTRAVENOUS
  Filled 2016-09-30: qty 200

## 2016-09-30 MED ORDER — LORAZEPAM 2 MG/ML IJ SOLN: 0.0000 mg | Freq: Two times a day (BID) | INTRAMUSCULAR | Status: DC

## 2016-09-30 MED ORDER — LORAZEPAM 2 MG/ML IJ SOLN
1.0000 mg | Freq: Four times a day (QID) | INTRAMUSCULAR | Status: DC | PRN
  Administered 2016-10-01 (×2): 1 mg via INTRAVENOUS
  Filled 2016-09-30 (×2): qty 1

## 2016-09-30 MED ORDER — NICOTINE 21 MG/24HR TD PT24
21.0000 mg | MEDICATED_PATCH | TRANSDERMAL | Status: DC
  Administered 2016-09-30: 21 mg via TRANSDERMAL
  Filled 2016-09-30 (×2): qty 1

## 2016-09-30 MED ORDER — IOPAMIDOL (ISOVUE-300) INJECTION 61%
INTRAVENOUS | Status: AC
  Administered 2016-09-30: 100 mL
  Filled 2016-09-30: qty 100

## 2016-09-30 MED ORDER — SODIUM CHLORIDE 0.9% FLUSH
3.0000 mL | Freq: Two times a day (BID) | INTRAVENOUS | Status: DC
  Administered 2016-10-01: 3 mL via INTRAVENOUS

## 2016-09-30 MED ORDER — ACETYLCYSTEINE LOAD VIA INFUSION
150.0000 mg/kg | Freq: Once | INTRAVENOUS | Status: DC
  Administered 2016-09-30: 8850 mg via INTRAVENOUS
  Filled 2016-09-30: qty 222

## 2016-09-30 MED ORDER — VITAMIN B-1 100 MG PO TABS
100.0000 mg | ORAL_TABLET | Freq: Every day | ORAL | Status: DC
  Administered 2016-09-30 – 2016-10-01 (×2): 100 mg via ORAL
  Filled 2016-09-30 (×2): qty 1

## 2016-09-30 MED ORDER — PROMETHAZINE HCL 25 MG/ML IJ SOLN
25.0000 mg | Freq: Four times a day (QID) | INTRAMUSCULAR | Status: DC | PRN
  Administered 2016-10-01: 25 mg via INTRAVENOUS
  Filled 2016-09-30: qty 1

## 2016-09-30 MED ORDER — GI COCKTAIL ~~LOC~~
30.0000 mL | Freq: Once | ORAL | Status: AC
  Administered 2016-09-30: 30 mL via ORAL
  Filled 2016-09-30: qty 30

## 2016-09-30 MED ORDER — METRONIDAZOLE 500 MG PO TABS
500.0000 mg | ORAL_TABLET | Freq: Once | ORAL | Status: AC
  Administered 2016-09-30: 500 mg via ORAL
  Filled 2016-09-30: qty 1

## 2016-09-30 MED ORDER — MORPHINE SULFATE (PF) 2 MG/ML IV SOLN: 1.0000 mg | INTRAVENOUS | Status: DC | PRN

## 2016-09-30 MED ORDER — THIAMINE HCL 100 MG/ML IJ SOLN: 100.0000 mg | Freq: Every day | INTRAMUSCULAR | Status: DC

## 2016-09-30 NOTE — ED Triage Notes (Signed)
Pt c/o abdominal pain onset Friday with N/V/D. Pt reports black colored stools. Pt reports co-workers recently sick with flu symptoms. Pt unable to tolerate PO food or fluids.

## 2016-09-30 NOTE — Progress Notes (Signed)
Paged MD about Lorazepam order scheduled for 2000 not having parameters. Order only says 0-4mg . MD stated it needs to be based on CIWA score. PT is currently at a 0, so MD stated not to give Ativan for 2000.   Eleanora Neighbor, RN

## 2016-09-30 NOTE — ED Notes (Signed)
Benson Norway, MD aware of CIWA score and does not want Ativan administered at this time

## 2016-09-30 NOTE — ED Provider Notes (Signed)
Complains of diffuse abdominal pain onset 3 days ago accompanied by 5 episodes of vomiting. He reports slight amount of blood mixed with emesis. Last bowel movement this morning was black. Unknown if he's had a fever. He is not nauseated at present. He's been unable to eat for the past 2 days. He drinks a sixpack of beer each day. Last drink alcohol yesterday. On exam appears in no distress. Lungs clear auscultation heart regular rate and rhythm abdomen normal active bowel sounds, nondistended, diffuse tenderness, no guarding rigidity or rebound. Genitalia normal male   Steven Dakin, MD 09/30/16 [phone]

## 2016-09-30 NOTE — H&P (Signed)
Date: 09/30/2016               Patient Name:  Steven Li MRN: [id number]  DOB: [birth year] Age / Sex: 50 y.o., male   PCP: No Pcp Per Patient         Medical Service: Internal Medicine Teaching Service         Attending Physician: Dr. Lucious Groves, DO    First Contact: Dr. Reesa Chew Pager: [phone]  Second Contact: Dr. Marlowe Sax Pager: [phone]       After Hours (After 5p/  First Contact Pager: [phone]  weekends / holidays): Second Contact Pager: [phone]   Chief Complaint: Abdominal pain, emesis, and diarrhea.  History of Present Illness: Steven Li, 50 year old AA man with past medical history significant for alcohol abuse came to ED with worsening abdominal pain associated with nausea and vomiting since Friday. He also had 2 episodes of black colored diarrhea this morning. He was treating his abdominal pain with Pepto-Bismol with minimum relief. He states that he noticed small amount of blood in his emesis since yesterday. He also complain of generalized aches  and pains. Do endorse some chills but denies fever. He was taking some ibuprofen for aches and pains. He denies any dysuria, burning micturition, urinary urgency or hesitancy. He states that his urine pain dark in color over last few days.  He has an history of alcohol abuse, states that he drinks at least 6 packs of 16 ounce beer every day.  Meds:  Current Meds  Medication Sig  . bismuth subsalicylate (PEPTO BISMOL) 262 MG/15ML suspension Take 30 mLs by mouth every 6 (six) hours as needed for indigestion.  Marland Kitchen ibuprofen (ADVIL,MOTRIN) 200 MG tablet Take 200 mg by mouth every 6 (six) hours as needed for headache or moderate pain.  . Tetrahydrozoline HCl (EYE DROPS OP) Place 2 drops into both eyes as needed (for red eyes).     Allergies: Allergies as of 09/30/2016  . (No Known Allergies)   Past Medical History:  Diagnosis Date  . ETOH abuse     Family History: Mother died of alcoholic cirrhosis.  Social  History: Smoker since the age of 18, 1 pack per day. Drink at least 6 packs of beer daily. Last intake was  night before. Uses marijuana on a regular basis, last use 2 days ago.  Review of Systems: A complete ROS was negative except as per HPI.   Physical Exam: Blood pressure 152/99, pulse 64, temperature 98 F (36.7 C), temperature source Oral, resp. rate 18, height 5\' 9"  (1.753 m), weight 59 kg (130 lb), SpO2 100 %.  Vitals:   09/30/16 0903 09/30/16 0930 09/30/16 1054 09/30/16 1254  BP: 142/90 152/99  (!) 156/103  Pulse:  64  (!) 109  Resp:    18  Temp:   98.8 F (37.1 C) 98 F (36.7 C)  TempSrc:   Oral Oral  SpO2:  100%  98%  Weight:      Height:       General: Vital signs reviewed. Little emaciated patient, in no acute distress and cooperative with exam.  Head: Normocephalic and atraumatic. Eyes: EOMI, conjunctivae normal,  scleral icterus.  Neck: Supple, trachea midline, normal ROM, no JVD, masses, thyromegaly, or carotid bruit present.  Cardiovascular: Tachycardia, S1 normal, S2 normal, no murmurs, gallops, or rubs. Pulmonary/Chest: Clear to auscultation bilaterally, no wheezes, rales, or rhonchi. Abdominal: Soft, diffusely tender, non-distended, BS +, no masses, organomegaly, or guarding present.  Musculoskeletal:  No joint deformities, erythema, or stiffness, ROM full and nontender. Extremities: No lower extremity edema bilaterally,  pulses symmetric and intact bilaterally. No cyanosis or clubbing. Neurological: A&O x3, Strength is normal and symmetric bilaterally, cranial nerve II-XII are grossly intact, no focal motor deficit, sensory intact to light touch bilaterally.  Skin: Warm, dry and intact. No rashes or erythema.  Labs. CBC    Component Value Date/Time   WBC 6.3 09/30/2016 0556   RBC 4.20 (L) 09/30/2016 0556   HGB 13.7 09/30/2016 0556   HCT 37.7 (L) 09/30/2016 0556   PLT 218 09/30/2016 0556   MCV 89.8 09/30/2016 0556   MCH 32.6 09/30/2016 0556   MCHC  36.3 (H) 09/30/2016 0556   RDW 13.6 09/30/2016 0556   LYMPHSABS 2.6 07/07/2014 0618   MONOABS 0.9 07/07/2014 0618   EOSABS 0.4 07/07/2014 0618   BASOSABS 0.1 07/07/2014 0618   CMP     Component Value Date/Time   NA 135 09/30/2016 0556   K 4.5 09/30/2016 0556   CL 97 (L) 09/30/2016 0556   CO2 26 09/30/2016 0556   GLUCOSE 158 (H) 09/30/2016 0556   BUN 6 09/30/2016 0556   CREATININE 1.05 09/30/2016 0556   CALCIUM 9.0 09/30/2016 0556   PROT 6.6 09/30/2016 0556   ALBUMIN 3.6 09/30/2016 0556   AST 1,657 (H) 09/30/2016 0556   ALT 728 (H) 09/30/2016 0556   ALKPHOS 124 09/30/2016 0556   BILITOT 3.9 (H) 09/30/2016 0556   GFRNONAA >60 09/30/2016 0556   GFRAA >60 09/30/2016 0556   Urinalysis    Component Value Date/Time   COLORURINE AMBER (A) 09/30/2016 0555   APPEARANCEUR HAZY (A) 09/30/2016 0555   LABSPEC 1.016 09/30/2016 0555   PHURINE 5.0 09/30/2016 0555   GLUCOSEU NEGATIVE 09/30/2016 0555   HGBUR NEGATIVE 09/30/2016 0555   BILIRUBINUR NEGATIVE 09/30/2016 0555   KETONESUR NEGATIVE 09/30/2016 0555   PROTEINUR 30 (A) 09/30/2016 0555   UROBILINOGEN 0.2 07/07/2014 0755   NITRITE NEGATIVE 09/30/2016 0555   LEUKOCYTESUR NEGATIVE 09/30/2016 0555    Acetaminophen level. <10  PT.  17.6 INR. 1.43  FOBT. Negative Lipase. 30  DG Abdomen. FINDINGS: Lungs are adequately inflated and otherwise clear. Cardiomediastinal silhouette and remainder of the chest is within normal.  Abdominopelvic images demonstrate a nonobstructive bowel gas pattern. No evidence of free peritoneal air. No mass or mass effect. Remaining bones and soft tissues are notable for minimal degenerate change of the hips.  IMPRESSION: Nonobstructive bowel gas pattern.  No acute cardiopulmonary disease.  CT abdomen and pelvis. COMPARISON:  11/09/2015 and 07/07/2014  FINDINGS: Lower chest: Within normal.  Hepatobiliary: The suggestive a subcentimeter cyst over the dome of the liver. Liver and biliary  tree are otherwise within normal. Gallbladder is normal.  Pancreas: Within normal.  Spleen: Within normal.  Adrenals/Urinary Tract: Adrenal glands are normal. Kidneys are normal size without hydronephrosis or nephrolithiasis. There is a 1.3 cm hypodensity over the mid pole left kidney without significant change likely a slightly hyperdense cyst. Ureters and bladder are normal.  Stomach/Bowel: Eccentric wall thickening at the gastroesophageal junction. Stomach and small bowel are otherwise within normal. Appendix is normal. Subtle wall thickening of the transverse colon without significant adjacent inflammatory change or free fluid.  Vascular/Lymphatic: Calcified plaque over the abdominal aorta and iliac arteries. Remaining vascular structures are within normal. No adenopathy.  Reproductive: Within normal.  Other: None.  Musculoskeletal: Minimal degenerative change of the hips.  IMPRESSION: Minimal wall thickening of the transverse colon which may be  due to mild acute colitis.  The mild eccentric wall thickening of the gastroesophageal junction.Consider further evaluation with barium swallow or endoscopic correlation.  1.3 cm hypodensity over the mid pole left kidney unchanged likely a slightly hyperdense cyst. Recommend followup CT 1 year.  Subcentimeter hypodensity over the dome of the liver unchanged likely a cyst.  Aortic atherosclerosis.   Assessment & Plan by Problem:   Mr. Gongwer, 50 year old AA man with past medical history significant for alcohol abuse came to ED with worsening abdominal pain associated with nausea and vomiting since Friday. He also had 2 episodes of black colored diarrhea this morning.   Transaminitis. He has mildly elevated AST and ALT for the last 2 years. bilirubin was normal at baseline. Now he has markedly elevated AST at 1657 and ALT at 728 with hyperbilirubinemia at 3.9. His differential can be  Liver toxin. According to ED note  he took 2 Tylenol for his aches and pain, when we asked him he said he took ibuprofen. His acetaminophen  Levels is below 10. There was some concern in ED as smaller amount of Tylenol in the setting of heavy alcohol abuse can cause acute liver damage. N Acetylcystine was started in ED, which was stopped after acetaminophen level results.  Viral hepatitis. HAV, HBV, HCV, and HSV panels were ordered.  Ischemic damage. Less likely, a his viral panels came back normal and hepatic enzymes keep worsening, he might need Doppler ultrasound.  -Zofran and Phenergan when necessary for vomiting. -Dextrose, half saline until he started tolerating by mouth intake.  Alcohol abuse. -CIWA protocol. -Daily thiamine -Folic acid  Dispo: Admit patient to Inpatient with expected length of stay greater than 2 midnights.  Signed: Lorella Nimrod, MD 09/30/2016, 10:47 AM  Pager: [phone]

## 2016-09-30 NOTE — ED Notes (Signed)
Admitting MD at bedside.

## 2016-09-30 NOTE — Consult Note (Signed)
Consult for [facility]  Reason for Consult: Elevated liver enzymes Referring Physician: [facility]  [name] HPI: This is a 50 year old male with a PMH of ETOH abuse and marijuana abuse with complaints of diffuse abdominal pain associated with nausea and vomiting for the past 4 days, however, this has been a chronic issue for the past 5 years.  He tried to self-medicate with Peptobismol, Tylenol, and ibuprofen without any benefit.  In the ER he was identified to have an AST 1657 and ALT 728.  His TB was at 3.9 and his prior AST/ALT were normal to mildly elevated.  A CT scan of the abdomen was negative for any hepatic abnormalities.  He states that he only took a total of 4 Tylenol and that he does not use that medication on a routine basis.  His INR was at 1.4.  Past Medical History:  Diagnosis Date  . ETOH abuse     History reviewed. No pertinent surgical history.  No family history on file.  Social History:  reports that he has been smoking Cigarettes.  He has been smoking about 1.00 pack per day. He has never used smokeless tobacco. He reports that he drinks about 3.0 oz of alcohol per week . He reports that he uses drugs, including Marijuana.  Allergies: No Known Allergies  Medications:  Scheduled: . acetylcysteine  150 mg/kg Intravenous Once   Continuous: . acetylcysteine    . ciprofloxacin 400 mg (09/30/16 1006)    Results for orders placed or performed during the hospital encounter of 09/30/16 (from the past 24 hour(s))  Urinalysis, Routine w reflex microscopic     Status: Abnormal   Collection Time: 09/30/16  5:55 AM  Result Value Ref Range   Color, Urine AMBER (A) YELLOW   APPearance HAZY (A) CLEAR   Specific Gravity, Urine 1.016 1.005 - 1.030   pH 5.0 5.0 - 8.0   Glucose, UA NEGATIVE NEGATIVE mg/dL   Hgb urine dipstick NEGATIVE NEGATIVE   Bilirubin Urine NEGATIVE NEGATIVE   Ketones, ur NEGATIVE NEGATIVE mg/dL   Protein, ur 30 (A) NEGATIVE mg/dL   Nitrite NEGATIVE NEGATIVE   Leukocytes, UA NEGATIVE NEGATIVE   RBC / HPF 0-5 0 - 5 RBC/hpf   WBC, UA 0-5 0 - 5 WBC/hpf   Bacteria, UA RARE (A) NONE SEEN   Squamous Epithelial / LPF 0-5 (A) NONE SEEN   Mucous PRESENT    Hyaline Casts, UA PRESENT   Lipase, blood     Status: None   Collection Time: 09/30/16  5:56 AM  Result Value Ref Range   Lipase 30 11 - 51 U/L  Comprehensive metabolic panel     Status: Abnormal   Collection Time: 09/30/16  5:56 AM  Result Value Ref Range   Sodium 135 135 - 145 mmol/L   Potassium 4.5 3.5 - 5.1 mmol/L   Chloride 97 (L) 101 - 111 mmol/L   CO2 26 22 - 32 mmol/L   Glucose, Bld 158 (H) 65 - 99 mg/dL   BUN 6 6 - 20 mg/dL   Creatinine, Ser 1.05 0.61 - 1.24 mg/dL   Calcium 9.0 8.9 - 10.3 mg/dL   Total Protein 6.6 6.5 - 8.1 g/dL   Albumin 3.6 3.5 - 5.0 g/dL   AST 1,657 (H) 15 - 41 U/L   ALT 728 (H) 17 - 63 U/L   Alkaline Phosphatase 124 38 - 126 U/L   Total Bilirubin 3.9 (H) 0.3 - 1.2 mg/dL  GFR calc non Af Amer >60 >60 mL/min   GFR calc Af Amer >60 >60 mL/min   Anion gap 12 5 - 15  CBC     Status: Abnormal   Collection Time: 09/30/16  5:56 AM  Result Value Ref Range   WBC 6.3 4.0 - 10.5 K/uL   RBC 4.20 (L) 4.22 - 5.81 MIL/uL   Hemoglobin 13.7 13.0 - 17.0 g/dL   HCT 37.7 (L) 39.0 - 52.0 %   MCV 89.8 78.0 - 100.0 fL   MCH 32.6 26.0 - 34.0 pg   MCHC 36.3 (H) 30.0 - 36.0 g/dL   RDW 13.6 11.5 - 15.5 %   Platelets 218 150 - 400 K/uL  POC occult blood, ED Provider will collect     Status: None   Collection Time: 09/30/16  7:34 AM  Result Value Ref Range   Fecal Occult Bld NEGATIVE NEGATIVE  Protime-INR     Status: Abnormal   Collection Time: 09/30/16  8:49 AM  Result Value Ref Range   Prothrombin Time 17.6 (H) 11.4 - 15.2 seconds   INR 1.43      Ct Abdomen Pelvis W Contrast  Result Date: 09/30/2016 CLINICAL DATA:  Abdominal pain and transaminitis and alcohol abuse. EXAM: CT ABDOMEN AND PELVIS WITH CONTRAST TECHNIQUE: Multidetector CT  imaging of the abdomen and pelvis was performed using the standard protocol following bolus administration of intravenous contrast. CONTRAST:  157mL ISOVUE-300 IOPAMIDOL (ISOVUE-300) INJECTION 61% COMPARISON:  11/09/2015 and 07/07/2014 FINDINGS: Lower chest: Within normal. Hepatobiliary: The suggestive a subcentimeter cyst over the dome of the liver. Liver and biliary tree are otherwise within normal. Gallbladder is normal. Pancreas: Within normal. Spleen: Within normal. Adrenals/Urinary Tract: Adrenal glands are normal. Kidneys are normal size without hydronephrosis or nephrolithiasis. There is a 1.3 cm hypodensity over the mid pole left kidney without significant change likely a slightly hyperdense cyst. Ureters and bladder are normal. Stomach/Bowel: Eccentric wall thickening at the gastroesophageal junction. Stomach and small bowel are otherwise within normal. Appendix is normal. Subtle wall thickening of the transverse colon without significant adjacent inflammatory change or free fluid. Vascular/Lymphatic: Calcified plaque over the abdominal aorta and iliac arteries. Remaining vascular structures are within normal. No adenopathy. Reproductive: Within normal. Other: None. Musculoskeletal: Minimal degenerative change of the hips. IMPRESSION: Minimal wall thickening of the transverse colon which may be due to mild acute colitis. The mild eccentric wall thickening of the gastroesophageal junction. Consider further evaluation with barium swallow or endoscopic correlation. 1.3 cm hypodensity over the mid pole left kidney unchanged likely a slightly hyperdense cyst. Recommend followup CT 1 year. Subcentimeter hypodensity over the dome of the liver unchanged likely a cyst. Aortic atherosclerosis. Electronically Signed   By: Marin Olp M.D.   On: 09/30/2016 08:38   Dg Abdomen Acute W/chest  Result Date: 09/30/2016 CLINICAL DATA:  Mid abdominal pain 3 days with nausea, vomiting and diarrhea. Black colored stools.  EXAM: DG ABDOMEN ACUTE W/ 1V CHEST COMPARISON:  Chest x-ray 07/02/2016 an abdominopelvic CT 07/07/2014 FINDINGS: Lungs are adequately inflated and otherwise clear. Cardiomediastinal silhouette and remainder of the chest is within normal. Abdominopelvic images demonstrate a nonobstructive bowel gas pattern. No evidence of free peritoneal air. No mass or mass effect. Remaining bones and soft tissues are notable for minimal degenerate change of the hips. IMPRESSION: Nonobstructive bowel gas pattern. No acute cardiopulmonary disease. Electronically Signed   By: Marin Olp M.D.   On: 09/30/2016 07:24    ROS:  As stated above  in the HPI otherwise negative.  Blood pressure 152/99, pulse 64, temperature 98 F (36.7 C), temperature source Oral, resp. rate 18, height 5\' 9"  (1.753 m), weight 59 kg (130 lb), SpO2 100 %.    PE: Gen: NAD, Alert and Oriented HEENT:  [place]/AT, EOMI Neck: Supple, no LAD Lungs: CTA Bilaterally CV: RRR without M/G/R ABM: Soft, muscle soreness from his vomiting, +BS Ext: No C/C/E  Assessment/Plan: 1) Abnormal liver enzymes. 2) Mild coagulopathy. 3) ETOH abuse. 4) Tylenol use.   Very few etiologies cause liver enzymes to increase above 1,000.  Toxins, ischemia, and viral infections are the top considerations for liver enzymes >1000.  In the setting of chronic ETOH abuse there are studies to suggest that normal amounts of acetaminophen can result in liver dysfunction.  He may have inadvertently taken more than he realized or taken the medication in a different form.  I do not feel that ischemia is an issue, but if his liver enzymes continue to be markedly elevated a doppler ultrasound is necessary.  Acute viral etiologies such as HBV, HCV, and HAV are common culprits.  HSV-1, typically in the setting of HSV encephalitis, can cause these elevations.  He needs to be screened for these viruses.  AIH can cause this type of pattern, but I do not feel that he has an autoimmune issue.  As  for his ABM pain, nausea, and vomiting, it can be from his hepatitis, but he has a significant history of marijuana use, 40 years.  Cannabinoid Hyperemesis Syndrome needs to be considered.  Plan: 1) Check acetaminophen level. 2) Emperically start NAC. 3) Check viral titers.  Calyn Sivils D 09/30/2016, 10:37 AM

## 2016-09-30 NOTE — Progress Notes (Signed)
Admission Note   Pt arrived via stretcher. Report was given at bedside. Pt is alert and orientated x4.    Pt was orientated to room, call light and phone within reach.  2 peripheral Ivs see IV assessment. Pt stated he was not feeling well upon arrival and presented with nausea.   PRN Zofran given see MAR.   Will continue to monitor.   Paulla Fore, RN

## 2016-09-30 NOTE — ED Notes (Signed)
Patient transported to X-ray 

## 2016-09-30 NOTE — ED Provider Notes (Signed)
[facility] DEPT Provider Note   CSN: [id number] Arrival date & time: 09/30/16  [id number]     History   Chief Complaint Chief Complaint  Patient presents with  . Abdominal Pain  . Emesis  . Blood In Stools    HPI Steven Li is a 50 y.o. male.  HPI   50 year old male with history of alcohol abuse presenting complaining of abdominal pain.Patient report for the past 4 days he has had recurring midabdominal pain. He described pain as a sharp sensation radiates across his abdomen, waxing and waning, currently rated as 10 out of 10. He has been feeling nauseous and had occasional vomitus with unknown content. He also noticed that his stools are dark and tarry. Report having 2-3 bouts of bowel movement per day. Endorse feeling weak, and lightheadedness for the past few days. Denies having fever, chills, chest pain, difficulty breathing, productive cough, back pain, dysuria or rash. He has been taking Pepto-Bismol with minimal relief. He also took 2 tylenol yesterday as well as some ibuprofen for pain. He admits to drinking alcohol on a daily basis usually a 6 packs per day, and also smoke cigarette a pack a day. Patient mention he has similar abdominal discomfort like this in the past but not severe. He denies smoking help for his alcohol abuse and does not want any help today for alcohol cessation.  Past Medical History:  Diagnosis Date  . ETOH abuse     There are no active problems to display for this patient.   History reviewed. No pertinent surgical history.     Home Medications    Prior to Admission medications   Medication Sig Start Date End Date Taking? Authorizing Provider  aspirin EC 81 MG tablet Take 162 mg by mouth every 6 (six) hours as needed for mild pain.    Historical Provider, MD  lansoprazole (PREVACID) 30 MG capsule Take 1 capsule (30 mg total) by mouth daily at 12 noon. 07/25/14   Julianne Rice, MD  promethazine (PHENERGAN) 25 MG tablet Take 1 tablet (25 mg  total) by mouth every 8 (eight) hours as needed for nausea or vomiting. 11/09/15   Dalia Heading, PA-C    Family History No family history on file.  Social History Social History  Substance Use Topics  . Smoking status: Current Every Day Smoker    Packs/day: 1.00    Types: Cigarettes  . Smokeless tobacco: Never Used  . Alcohol use 3.0 oz/week    5 Cans of beer per week     Allergies   Patient has no known allergies.   Review of Systems Review of Systems  All other systems reviewed and are negative.    Physical Exam Updated Vital Signs BP (!) 153/123 (BP Location: Left Arm)   Pulse 97   Temp 97.7 F (36.5 C) (Oral)   Resp 18   Ht 5\' 9"  (1.753 m)   Wt 59 kg   SpO2 97%   BMI 19.20 kg/m   Physical Exam  Constitutional: No distress.  Cachectic appearing male laying in bed in mild discomfort.  HENT:  Head: Atraumatic.  Mouth is dry  Eyes: Conjunctivae are normal. Scleral icterus is present.  Neck: Neck supple.  Cardiovascular: Normal rate and regular rhythm.   Pulmonary/Chest: Effort normal and breath sounds normal.  Abdominal: Soft. He exhibits no distension. There is tenderness (Mild diffuse abdominal tenderness without guarding or rebound tenderness. Abdomen is nondistended).  Genitourinary:  Genitourinary Comments: Chaperone present during exam. Normal  rectal tone, no thrombosed external hemorrhoid, no obvious mass, minimal stool in rectal vault, no obvious black tarry stool or blood noted on glove.  Neurological: He is alert. GCS eye subscore is 4. GCS verbal subscore is 5. GCS motor subscore is 6.  Skin: No rash noted.  Psychiatric: He has a normal mood and affect.  Nursing note and vitals reviewed.    ED Treatments / Results  Labs (all labs ordered are listed, but only abnormal results are displayed) Labs Reviewed  COMPREHENSIVE METABOLIC PANEL - Abnormal; Notable for the following:       Result Value   Chloride 97 (*)    Glucose, Bld 158 (*)      AST 1,657 (*)    ALT 728 (*)    Total Bilirubin 3.9 (*)    All other components within normal limits  CBC - Abnormal; Notable for the following:    RBC 4.20 (*)    HCT 37.7 (*)    MCHC 36.3 (*)    All other components within normal limits  URINALYSIS, ROUTINE W REFLEX MICROSCOPIC - Abnormal; Notable for the following:    Color, Urine AMBER (*)    APPearance HAZY (*)    Protein, ur 30 (*)    Bacteria, UA RARE (*)    Squamous Epithelial / LPF 0-5 (*)    All other components within normal limits  PROTIME-INR - Abnormal; Notable for the following:    Prothrombin Time 17.6 (*)    All other components within normal limits  ACETAMINOPHEN LEVEL - Abnormal; Notable for the following:    Acetaminophen (Tylenol), Serum <10 (*)    All other components within normal limits  LIPASE, BLOOD  HEPATITIS B SURFACE ANTIBODY  HEPATITIS B SURFACE ANTIGEN  HEPATITIS C ANTIBODY  HEPATITIS A ANTIBODY, IGM  POC OCCULT BLOOD, ED    EKG  EKG Interpretation None       Radiology Ct Abdomen Pelvis W Contrast  Result Date: 09/30/2016 CLINICAL DATA:  Abdominal pain and transaminitis and alcohol abuse. EXAM: CT ABDOMEN AND PELVIS WITH CONTRAST TECHNIQUE: Multidetector CT imaging of the abdomen and pelvis was performed using the standard protocol following bolus administration of intravenous contrast. CONTRAST:  111mL ISOVUE-300 IOPAMIDOL (ISOVUE-300) INJECTION 61% COMPARISON:  11/09/2015 and 07/07/2014 FINDINGS: Lower chest: Within normal. Hepatobiliary: The suggestive a subcentimeter cyst over the dome of the liver. Liver and biliary tree are otherwise within normal. Gallbladder is normal. Pancreas: Within normal. Spleen: Within normal. Adrenals/Urinary Tract: Adrenal glands are normal. Kidneys are normal size without hydronephrosis or nephrolithiasis. There is a 1.3 cm hypodensity over the mid pole left kidney without significant change likely a slightly hyperdense cyst. Ureters and bladder are normal.  Stomach/Bowel: Eccentric wall thickening at the gastroesophageal junction. Stomach and small bowel are otherwise within normal. Appendix is normal. Subtle wall thickening of the transverse colon without significant adjacent inflammatory change or free fluid. Vascular/Lymphatic: Calcified plaque over the abdominal aorta and iliac arteries. Remaining vascular structures are within normal. No adenopathy. Reproductive: Within normal. Other: None. Musculoskeletal: Minimal degenerative change of the hips. IMPRESSION: Minimal wall thickening of the transverse colon which may be due to mild acute colitis. The mild eccentric wall thickening of the gastroesophageal junction. Consider further evaluation with barium swallow or endoscopic correlation. 1.3 cm hypodensity over the mid pole left kidney unchanged likely a slightly hyperdense cyst. Recommend followup CT 1 year. Subcentimeter hypodensity over the dome of the liver unchanged likely a cyst. Aortic atherosclerosis. Electronically Signed  By: Marin Olp M.D.   On: 09/30/2016 08:38   Dg Abdomen Acute W/chest  Result Date: 09/30/2016 CLINICAL DATA:  Mid abdominal pain 3 days with nausea, vomiting and diarrhea. Black colored stools. EXAM: DG ABDOMEN ACUTE W/ 1V CHEST COMPARISON:  Chest x-ray 07/02/2016 an abdominopelvic CT 07/07/2014 FINDINGS: Lungs are adequately inflated and otherwise clear. Cardiomediastinal silhouette and remainder of the chest is within normal. Abdominopelvic images demonstrate a nonobstructive bowel gas pattern. No evidence of free peritoneal air. No mass or mass effect. Remaining bones and soft tissues are notable for minimal degenerate change of the hips. IMPRESSION: Nonobstructive bowel gas pattern. No acute cardiopulmonary disease. Electronically Signed   By: Marin Olp M.D.   On: 09/30/2016 07:24    Procedures Procedures (including critical care time)  Medications Ordered in ED Medications  ondansetron (ZOFRAN) injection 4 mg  (not administered)  sodium chloride 0.9 % bolus 1,000 mL (0 mLs Intravenous Stopped 09/30/16 1006)  gi cocktail (Maalox,Lidocaine,Donnatal) (30 mLs Oral Given 09/30/16 0723)  morphine 4 MG/ML injection 4 mg (4 mg Intravenous Given 09/30/16 0824)  ondansetron (ZOFRAN) injection 4 mg (4 mg Intravenous Given 09/30/16 0724)  iopamidol (ISOVUE-300) 61 % injection (100 mLs  Contrast Given 09/30/16 0756)  ciprofloxacin (CIPRO) IVPB 400 mg (400 mg Intravenous Transfusing/Transfer 09/30/16 1126)  metroNIDAZOLE (FLAGYL) tablet 500 mg (500 mg Oral Given 09/30/16 1009)     Initial Impression / Assessment and Plan / ED Course  I have reviewed the triage vital signs and the nursing notes.  Pertinent labs & imaging results that were available during my care of the patient were reviewed by me and considered in my medical decision making (see chart for details).  Clinical Course     BP 152/99   Pulse 64   Temp 98.8 F (37.1 C) (Oral)   Resp 18   Ht 5\' 9"  (1.753 m)   Wt 59 kg   SpO2 100%   BMI 19.20 kg/m    Final Clinical Impressions(s) / ED Diagnoses   Final diagnoses:  Transaminitis  Colitis  Alcohol abuse    New Prescriptions New Prescriptions   No medications on file   6:54 AM This gentleman has a significant history of alcohol abuse, presenting with diffuse abdominal pain and black tarry stool. Suspect potential upper GI bleed given his history of recurrent alcohol use. He did take Pepto-Bismol which can also give stool a black appearance. He does have some mild diffuse abdominal tenderness without distention or guarding. However, an acute abdominal series will be obtained to rule out free air. Pain medication, IV fluid, and antinausea medication given. GI cocktail provided. His vital signs stable.  7:45 AM Evidence of transaminitis with an AST of 1657, AST 728, and total bili of 3.9. This is concerning for alcoholic hepatitis. Will obtain abdominal and pelvis CT scan for further  evaluation. Patient is resting comfortably At this time.  8:26 AM Hemoccult negative.  Suspect dark tarry stool may be 2/2 Pepto Bismol.  Normal lipase.  Normal WBC.    9:49 AM Abd/pelvis CT demonstrates minimal wall thickening of the transverse colon which may be due to mild acute colitis.  Mild eccendtric wall thickening of the GE junction, which may need barium swallow or endoscopic correlation.  Given these findings, I will treat for colitis with cipro/flagyl.  I will also consult GI for further management.  Pt will need to be admitted as well.  Care discussed with Dr. Winfred Leeds.    10:05 AM Appreciate  consultation from GI specialist Dr. Benson Norway, who recommend a tylenol level and also to treat pt with mucomyst for suspected tylenol induce hepatitis.  He will be available for consultation.  Will consult medicine for admission.    10:38 AM Appreciate consultation from Internal Medicine resident who agrees to see pt in the ER and will admit to medical surgical floor, under attending Dr. Heber Brule.  Pt will need to be on CIWA protocol due to alcohol abuse.  Last alcohol consumption was 9pm last night.    Domenic Moras, PA-C 09/30/16 [place], MD 10/02/16 Joen Laura

## 2016-10-01 DIAGNOSIS — K759 Inflammatory liver disease, unspecified: Secondary | ICD-10-CM | POA: Diagnosis present

## 2016-10-01 DIAGNOSIS — F1010 Alcohol abuse, uncomplicated: Secondary | ICD-10-CM | POA: Diagnosis present

## 2016-10-01 DIAGNOSIS — I700 Atherosclerosis of aorta: Secondary | ICD-10-CM | POA: Diagnosis present

## 2016-10-01 DIAGNOSIS — K7010 Alcoholic hepatitis without ascites: Principal | ICD-10-CM

## 2016-10-01 DIAGNOSIS — K219 Gastro-esophageal reflux disease without esophagitis: Secondary | ICD-10-CM | POA: Diagnosis present

## 2016-10-01 LAB — COMPREHENSIVE METABOLIC PANEL
ALBUMIN: 3.1 g/dL — AB (ref 3.5–5.0)
ALT: 486 U/L — ABNORMAL HIGH (ref 17–63)
ANION GAP: 11 (ref 5–15)
AST: 795 U/L — AB (ref 15–41)
Alkaline Phosphatase: 107 U/L (ref 38–126)
CO2: 25 mmol/L (ref 22–32)
Calcium: 8.7 mg/dL — ABNORMAL LOW (ref 8.9–10.3)
Chloride: 97 mmol/L — ABNORMAL LOW (ref 101–111)
Creatinine, Ser: 0.71 mg/dL (ref 0.61–1.24)
GFR calc Af Amer: >60 mL/min (ref >60)
GFR calc non Af Amer: >60 mL/min (ref >60)
GLUCOSE: 136 mg/dL — AB (ref 65–99)
POTASSIUM: 5.2 mmol/L — AB (ref 3.5–5.1)
SODIUM: 133 mmol/L — AB (ref 135–145)
Total Bilirubin: 11.7 mg/dL — ABNORMAL HIGH (ref 0.3–1.2)
Total Protein: 5.6 g/dL — ABNORMAL LOW (ref 6.5–8.1)

## 2016-10-01 LAB — CBC
HEMATOCRIT: 37.1 % — AB (ref 39.0–52.0)
HEMOGLOBIN: 12.9 g/dL — AB (ref 13.0–17.0)
MCH: 32.1 pg (ref 26.0–34.0)
MCHC: 34.8 g/dL (ref 30.0–36.0)
MCV: 92.3 fL (ref 78.0–100.0)
Platelets: 168 10*3/uL (ref 150–400)
RBC: 4.02 MIL/uL — ABNORMAL LOW (ref 4.22–5.81)
RDW: 14 % (ref 11.5–15.5)
WBC: 9.4 10*3/uL (ref 4.0–10.5)

## 2016-10-01 MED ORDER — PREDNISONE 20 MG PO TABS
40.0000 mg | ORAL_TABLET | Freq: Every day | ORAL | Status: DC
  Administered 2016-10-01: 40 mg via ORAL
  Filled 2016-10-01: qty 2

## 2016-10-01 MED ORDER — SODIUM POLYSTYRENE SULFONATE 15 GM/60ML PO SUSP
15.0000 g | Freq: Once | ORAL | Status: AC
Start: 2016-10-01 — End: 2016-10-01
  Administered 2016-10-01: 15 g via ORAL
  Filled 2016-10-01: qty 60

## 2016-10-01 MED ORDER — CIPROFLOXACIN HCL 500 MG PO TABS
500.0000 mg | ORAL_TABLET | Freq: Two times a day (BID) | ORAL | Status: DC
  Administered 2016-10-01: 500 mg via ORAL
  Filled 2016-10-01: qty 1

## 2016-10-01 MED ORDER — METRONIDAZOLE 500 MG PO TABS
500.0000 mg | ORAL_TABLET | Freq: Three times a day (TID) | ORAL | Status: DC
  Administered 2016-10-01: 500 mg via ORAL
  Filled 2016-10-01: qty 1

## 2016-10-01 NOTE — Progress Notes (Signed)
Pt having tachycardia after getting up to the bathroom. Ativan was admin and was effective. Pt CIWA at a 0.   Eleanora Neighbor, RN

## 2016-10-01 NOTE — Progress Notes (Signed)
Pt stated he wanted to leave the hospital AMA because worked called him and he "needs to be there tomorrow."  Paged and spoke to Dr. Reesa Chew about the situation.   Paged the on call team.    Administered Ativan PRN see MAR.    Pt is calm sitting on the side of the bed, dressed and ready to leave.   Awaiting response of the on call team.   Paulla Fore, RN

## 2016-10-01 NOTE — Progress Notes (Signed)
Pt stated that he needed to leave due to work.   Pt signed AMA form. Dr. Heber [place] was paged and attending talked to the pt.   Peripheral IV was taken out and pt was escorted off the unit via wheelchair.   Called the sister, Belenda Cruise and she picked the pt up.   Paulla Fore, RN

## 2016-10-01 NOTE — Progress Notes (Signed)
   Subjective: Patient states that he feels better as compared to yesterday. Still having abdominal pain. Denies any more nausea or vomiting. He was complaining of dark colored urine.  Objective:  Vital signs in last 24 hours: Vitals:   09/30/16 2136 09/30/16 2200 10/01/16 0608 10/01/16 0928  BP: (!) 138/104 (!) 138/98 119/78 109/77  Pulse: 83  79 (!) 120  Resp: 16  16 19   Temp: 98.2 F (36.8 C)  97.6 F (36.4 C) 98.7 F (37.1 C)  TempSrc: Oral  Oral Oral  SpO2: 100%  100% 100%  Weight:      Height:       Gen. well-built, malnourished man, in no acute distress. Eyes. Worsening scleral icterus. Abdomen. Generalized tenderness, no rebound or guarding, nondistended, bowel sounds positive. Chest. Clear bilaterally. CV. Regular rate and rhythm. Extremities. No edema, no cyanosis, pulses 2+ bilaterally.  Labs. CBC Latest Ref Rng & Units 10/01/2016 09/30/2016 07/02/2016  WBC 4.0 - 10.5 K/uL 9.4 6.3 5.8  Hemoglobin 13.0 - 17.0 g/dL 12.9(L) 13.7 13.2  Hematocrit 39.0 - 52.0 % 37.1(L) 37.7(L) 38.5(L)  Platelets 150 - 400 K/uL 168 218 246   CMP Latest Ref Rng & Units 10/01/2016 09/30/2016 07/02/2016  Glucose 65 - 99 mg/dL 136(H) 158(H) 124(H)  BUN 6 - 20 mg/dL <5(L) 6 14  Creatinine 0.61 - 1.24 mg/dL 0.71 1.05 0.92  Sodium 135 - 145 mmol/L 133(L) 135 137  Potassium 3.5 - 5.1 mmol/L 5.2(H) 4.5 5.5(H)  Chloride 101 - 111 mmol/L 97(L) 97(L) 98(L)  CO2 22 - 32 mmol/L 25 26 24   Calcium 8.9 - 10.3 mg/dL 8.7(L) 9.0 9.5  Total Protein 6.5 - 8.1 g/dL 5.6(L) 6.6 7.0  Total Bilirubin 0.3 - 1.2 mg/dL 11.7(H) 3.9(H) 0.9  Alkaline Phos 38 - 126 U/L 107 124 85  AST 15 - 41 U/L 795(H) 1,657(H) 102(H)  ALT 17 - 63 U/L 486(H) 728(H) 62    Assessment/Plan:  Mr. Punzalan, 50 year old AA man with past medical history significant for alcohol abuse came to ED with worsening abdominal pain associated with nausea and vomiting since Friday. He also had 2 episodes of black colored diarrhea this morning.    Transaminitis. His AST and ALT are improving. Total bilirubin has markedly increased to 11.7. Most probable cause and his history of alcohol abuse will be alcoholic hepatitis. Viral hepatitis panel is still pending. His alcoholic hepatitis severity score was 37. This score more than 32, and there was a recommendation to add prednisone to decrease severity and prevent further damage. -Glasgow alcoholic hepatitis score was 7. -MELD score was 20. -Prednisone  40 mg daily was added.  Colitis. Her CT abdomen shows possible colitis at transverse colon. He is still complaining of abdominal pain. -Cipro. 500 mg twice a day for 7 days. -Flagyl 500 mg every 8 hourly for 7 days.  Alcohol abuse. CIWA score was 0. -Continue CIWA protocol. -Continue Daily thiamine -Continue Folic acid.  Dispo: Anticipated discharge in approximately 2-3 day(s).   Lorella Nimrod, MD 10/01/2016, 11:52 AM Pager: [phone]

## 2016-10-01 NOTE — Progress Notes (Signed)
Daily Rounding Note  10/01/2016, 3:02 PM  LOS: 1 day   SUBJECTIVE:   Chief complaint: abdominal pain.  Pain has resolved.  Had large, soft, formed brown stool this AM.  Tolerating regular diet.  Has constipation, black stools PTA but was using Bismuth.      No pruritus.   uirine is dark.    OBJECTIVE:         Vital signs in last 24 hours:    Temp:  [97.6 F (36.4 C)-98.7 F (37.1 C)] 98.7 F (37.1 C) (12/26 0928) Pulse Rate:  [64-120] 120 (12/26 0928) Resp:  [16-19] 19 (12/26 0928) BP: (109-160)/(77-104) 109/77 (12/26 0928) SpO2:  [99 %-100 %] 100 % (12/26 0928) Last BM Date: 09/30/16 Filed Weights   09/30/16 0552  Weight: 59 kg (130 lb)   General: pleasant, comfortable, thin.  Does not look ill   Heart: RRR Chest: clear bil.   Abdomen: soft, NT, ND.  Active BS.  No HSM  Extremities: no CCE Neuro/Psych:  Pleasant, cooperative.  Oriented x 3.    Intake/Output from previous day: 12/25 0701 - 12/26 0700 In: 3130 [P.O.:180; I.V.:2950] Out: 0   Intake/Output this shift: Total I/O In: 665 [P.O.:662; I.V.:3] Out: 400 [Urine:400]  Lab Results:  Recent Labs  09/30/16 0556 10/01/16 0542  WBC 6.3 9.4  HGB 13.7 12.9*  HCT 37.7* 37.1*  PLT 218 168   BMET  Recent Labs  09/30/16 0556 10/01/16 0542  NA 135 133*  K 4.5 5.2*  CL 97* 97*  CO2 26 25  GLUCOSE 158* 136*  BUN 6 <5*  CREATININE 1.05 0.71  CALCIUM 9.0 8.7*   LFT  Recent Labs  09/30/16 0556 10/01/16 0542  PROT 6.6 5.6*  ALBUMIN 3.6 3.1*  AST 1,657* 795*  ALT 728* 486*  ALKPHOS 124 107  BILITOT 3.9* 11.7*   PT/INR  Recent Labs  09/30/16 0849  LABPROT 17.6*  INR 1.43   Hepatitis Panel No results for input(s): HEPBSAG, HCVAB, HEPAIGM, HEPBIGM in the last 72 hours.  Studies/Results: Ct Abdomen Pelvis W Contrast  Result Date: 09/30/2016 CLINICAL DATA:  Abdominal pain and transaminitis and alcohol abuse. EXAM: CT ABDOMEN AND  PELVIS WITH CONTRAST TECHNIQUE: Multidetector CT imaging of the abdomen and pelvis was performed using the standard protocol following bolus administration of intravenous contrast. CONTRAST:  172mL ISOVUE-300 IOPAMIDOL (ISOVUE-300) INJECTION 61% COMPARISON:  11/09/2015 and 07/07/2014 FINDINGS: Lower chest: Within normal. Hepatobiliary: The suggestive a subcentimeter cyst over the dome of the liver. Liver and biliary tree are otherwise within normal. Gallbladder is normal. Pancreas: Within normal. Spleen: Within normal. Adrenals/Urinary Tract: Adrenal glands are normal. Kidneys are normal size without hydronephrosis or nephrolithiasis. There is a 1.3 cm hypodensity over the mid pole left kidney without significant change likely a slightly hyperdense cyst. Ureters and bladder are normal. Stomach/Bowel: Eccentric wall thickening at the gastroesophageal junction. Stomach and small bowel are otherwise within normal. Appendix is normal. Subtle wall thickening of the transverse colon without significant adjacent inflammatory change or free fluid. Vascular/Lymphatic: Calcified plaque over the abdominal aorta and iliac arteries. Remaining vascular structures are within normal. No adenopathy. Reproductive: Within normal. Other: None. Musculoskeletal: Minimal degenerative change of the hips. IMPRESSION: Minimal wall thickening of the transverse colon which may be due to mild acute colitis. The mild eccentric wall thickening of the gastroesophageal junction. Consider further evaluation with barium swallow or endoscopic correlation. 1.3 cm hypodensity over the mid pole left kidney  unchanged likely a slightly hyperdense cyst. Recommend followup CT 1 year. Subcentimeter hypodensity over the dome of the liver unchanged likely a cyst. Aortic atherosclerosis. Electronically Signed   By: Marin Olp M.D.   On: 09/30/2016 08:38   Dg Abdomen Acute W/chest  Result Date: 09/30/2016 CLINICAL DATA:  Mid abdominal pain 3 days with  nausea, vomiting and diarrhea. Black colored stools. EXAM: DG ABDOMEN ACUTE W/ 1V CHEST COMPARISON:  Chest x-ray 07/02/2016 an abdominopelvic CT 07/07/2014 FINDINGS: Lungs are adequately inflated and otherwise clear. Cardiomediastinal silhouette and remainder of the chest is within normal. Abdominopelvic images demonstrate a nonobstructive bowel gas pattern. No evidence of free peritoneal air. No mass or mass effect. Remaining bones and soft tissues are notable for minimal degenerate change of the hips. IMPRESSION: Nonobstructive bowel gas pattern. No acute cardiopulmonary disease. Electronically Signed   By: Marin Olp M.D.   On: 09/30/2016 07:24    ASSESMENT:   *  Acute hepatitis, recurrent.   Received single dose of Mucomyst 12/25 for suspicion of acetaminophen toxicity (takes 2 per day for MS pain) not using it excessively.  It was discontinued for APAP level <10.  He abuses alcohol but transaminases much higher then is commonly seen in alcoholic hepatitis. MELD was 20.  Started on prednisone for ? etoh hepatitis and a alcoholic hepatitis severity score of 37, Discriminant function score was 30 yesterday. ? Ischemic liver injury/shock liver.   Transaminitis improved. Total bilirubin climbing. PT mildly elevated, INR normal. Acute hepatitis and HSV1 serologies in process.    *  Acute on chronic abdominal pain with N/V, black-colored stool.   CT abdomen shows possible mild transverse colitis. Day 2 empiric Cipro, Flagyl.  Clinically the pain, black stools and n/v are resolved.  ?could this be ischemic colitis.  *  Wall thickening of GE junction on CT. Incidental finding.  PLAN   *  Check PT/INR, CMET tomorrow.       Azucena Freed  10/01/2016, 3:02 PM   I have discussed the case with the PA, and that is the plan I formulated. I personally interviewed and examined the patient.  [id number] LFTs  Patient denies cocaine.  Admits to etoh and marijuana.  This looks like multifactorial  acute hepatitis from Etoh and something such as tylenol, viral hepatitis, drugs, less likely ischemia.  No INR today.  Not surprising that bilirubin rising with transaminases falling.  Typical for this sort of process.  I had hope to do a tox screen and see what his labs were looking like in AM, but patient has decided to leave AMA so he does not lose his job.  Dr Heber [place] from internal medicine is up now to see patient.I told him that steroids are not indicated at this point, given that this does not look like pure EtOH hepatitis.  We will see him if he returns and the need arises.   Nelida Meuse III Pager [phone]  Mon-Fri 8a-5p [phone] after 5p, weekends, holidays Pager: [phone]

## 2016-10-02 ENCOUNTER — Encounter (HOSPITAL_COMMUNITY): Payer: Self-pay | Admitting: Emergency Medicine

## 2016-10-02 ENCOUNTER — Emergency Department (HOSPITAL_COMMUNITY): Payer: BLUE CROSS/BLUE SHIELD

## 2016-10-02 ENCOUNTER — Inpatient Hospital Stay (HOSPITAL_COMMUNITY)
Admission: EM | Admit: 2016-10-02 | Discharge: 2016-10-06 | DRG: 442 | Disposition: A | Discharge status: Home / Self Care | Payer: BLUE CROSS/BLUE SHIELD | Attending: Internal Medicine | Admitting: Internal Medicine

## 2016-10-02 DIAGNOSIS — F17210 Nicotine dependence, cigarettes, uncomplicated: Secondary | ICD-10-CM | POA: Diagnosis present

## 2016-10-02 DIAGNOSIS — R945 Abnormal results of liver function studies: Secondary | ICD-10-CM | POA: Diagnosis present

## 2016-10-02 DIAGNOSIS — F1020 Alcohol dependence, uncomplicated: Secondary | ICD-10-CM | POA: Diagnosis present

## 2016-10-02 DIAGNOSIS — Z681 Body mass index (BMI) 19 or less, adult: Secondary | ICD-10-CM | POA: Diagnosis not present

## 2016-10-02 DIAGNOSIS — D62 Acute posthemorrhagic anemia: Secondary | ICD-10-CM | POA: Diagnosis not present

## 2016-10-02 DIAGNOSIS — R7989 Other specified abnormal findings of blood chemistry: Secondary | ICD-10-CM | POA: Diagnosis present

## 2016-10-02 DIAGNOSIS — K219 Gastro-esophageal reflux disease without esophagitis: Secondary | ICD-10-CM | POA: Diagnosis present

## 2016-10-02 DIAGNOSIS — B179 Acute viral hepatitis, unspecified: Principal | ICD-10-CM | POA: Diagnosis present

## 2016-10-02 DIAGNOSIS — R6510 Systemic inflammatory response syndrome (SIRS) of non-infectious origin without acute organ dysfunction: Secondary | ICD-10-CM | POA: Diagnosis present

## 2016-10-02 DIAGNOSIS — D649 Anemia, unspecified: Secondary | ICD-10-CM | POA: Diagnosis not present

## 2016-10-02 DIAGNOSIS — R7889 Finding of other specified substances, not normally found in blood: Secondary | ICD-10-CM | POA: Diagnosis not present

## 2016-10-02 DIAGNOSIS — K760 Fatty (change of) liver, not elsewhere classified: Secondary | ICD-10-CM | POA: Diagnosis present

## 2016-10-02 DIAGNOSIS — I10 Essential (primary) hypertension: Secondary | ICD-10-CM | POA: Diagnosis present

## 2016-10-02 DIAGNOSIS — K649 Unspecified hemorrhoids: Secondary | ICD-10-CM | POA: Diagnosis present

## 2016-10-02 DIAGNOSIS — R748 Abnormal levels of other serum enzymes: Secondary | ICD-10-CM | POA: Diagnosis not present

## 2016-10-02 DIAGNOSIS — K759 Inflammatory liver disease, unspecified: Secondary | ICD-10-CM | POA: Diagnosis not present

## 2016-10-02 DIAGNOSIS — K7290 Hepatic failure, unspecified without coma: Secondary | ICD-10-CM

## 2016-10-02 DIAGNOSIS — R931 Abnormal findings on diagnostic imaging of heart and coronary circulation: Secondary | ICD-10-CM | POA: Diagnosis not present

## 2016-10-02 DIAGNOSIS — K529 Noninfective gastroenteritis and colitis, unspecified: Secondary | ICD-10-CM | POA: Diagnosis present

## 2016-10-02 DIAGNOSIS — K7682 Hepatic encephalopathy: Secondary | ICD-10-CM | POA: Insufficient documentation

## 2016-10-02 DIAGNOSIS — R112 Nausea with vomiting, unspecified: Secondary | ICD-10-CM | POA: Diagnosis present

## 2016-10-02 DIAGNOSIS — R310 Gross hematuria: Secondary | ICD-10-CM | POA: Diagnosis present

## 2016-10-02 DIAGNOSIS — E876 Hypokalemia: Secondary | ICD-10-CM | POA: Diagnosis present

## 2016-10-02 DIAGNOSIS — K922 Gastrointestinal hemorrhage, unspecified: Secondary | ICD-10-CM | POA: Diagnosis not present

## 2016-10-02 DIAGNOSIS — I700 Atherosclerosis of aorta: Secondary | ICD-10-CM | POA: Diagnosis present

## 2016-10-02 DIAGNOSIS — R778 Other specified abnormalities of plasma proteins: Secondary | ICD-10-CM

## 2016-10-02 HISTORY — DX: Essential (primary) hypertension: I10

## 2016-10-02 LAB — I-STAT TROPONIN, ED
Troponin i, poc: 0.67 ng/mL (ref 0.00–0.08)
Troponin i, poc: 0.73 ng/mL (ref 0.00–0.08)

## 2016-10-02 LAB — CBC WITH DIFFERENTIAL/PLATELET
BASOS ABS: 0 10*3/uL (ref 0.0–0.1)
Basophils Relative: 0 %
EOS ABS: 0 10*3/uL (ref 0.0–0.7)
Eosinophils Relative: 0 %
HCT: 30.5 % — ABNORMAL LOW (ref 39.0–52.0)
Hemoglobin: 10.2 g/dL — ABNORMAL LOW (ref 13.0–17.0)
LYMPHS ABS: 1.8 10*3/uL (ref 0.7–4.0)
LYMPHS PCT: 9 %
MCH: 33.3 pg (ref 26.0–34.0)
MCHC: 33.4 g/dL (ref 30.0–36.0)
MCV: 99.7 fL (ref 78.0–100.0)
MONO ABS: 1 10*3/uL (ref 0.1–1.0)
Monocytes Relative: 5 %
NEUTROS PCT: 86 %
NRBC: 1 /100{WBCs} — AB
Neutro Abs: 17.1 10*3/uL — ABNORMAL HIGH (ref 1.7–7.7)
PLATELETS: 83 10*3/uL — AB (ref 150–400)
RBC: 3.06 MIL/uL — ABNORMAL LOW (ref 4.22–5.81)
RDW: 14.9 % (ref 11.5–15.5)
WBC: 19.9 10*3/uL — ABNORMAL HIGH (ref 4.0–10.5)

## 2016-10-02 LAB — URINALYSIS, ROUTINE W REFLEX MICROSCOPIC
Glucose, UA: NEGATIVE mg/dL
KETONES UR: NEGATIVE mg/dL
LEUKOCYTES UA: NEGATIVE
Nitrite: NEGATIVE
PROTEIN: 100 mg/dL — AB
Specific Gravity, Urine: 1.015 (ref 1.005–1.030)
pH: 5 (ref 5.0–8.0)

## 2016-10-02 LAB — COMPREHENSIVE METABOLIC PANEL
ALT: 338 U/L — ABNORMAL HIGH (ref 17–63)
ANION GAP: 12 (ref 5–15)
AST: 492 U/L — AB (ref 15–41)
Albumin: 3.4 g/dL — ABNORMAL LOW (ref 3.5–5.0)
Alkaline Phosphatase: 139 U/L — ABNORMAL HIGH (ref 38–126)
BILIRUBIN TOTAL: 28.9 mg/dL — AB (ref 0.3–1.2)
BUN: 21 mg/dL — AB (ref 6–20)
CHLORIDE: 96 mmol/L — AB (ref 101–111)
CO2: 26 mmol/L (ref 22–32)
Calcium: 9.3 mg/dL (ref 8.9–10.3)
Creatinine, Ser: <0.3 mg/dL — ABNORMAL LOW (ref 0.61–1.24)
Glucose, Bld: 107 mg/dL — ABNORMAL HIGH (ref 65–99)
POTASSIUM: 4.4 mmol/L (ref 3.5–5.1)
Sodium: 134 mmol/L — ABNORMAL LOW (ref 135–145)
TOTAL PROTEIN: 6.2 g/dL — AB (ref 6.5–8.1)

## 2016-10-02 LAB — RAPID URINE DRUG SCREEN, HOSP PERFORMED
AMPHETAMINES: NOT DETECTED
BENZODIAZEPINES: POSITIVE — AB
Barbiturates: NOT DETECTED
Cocaine: NOT DETECTED
OPIATES: NOT DETECTED
TETRAHYDROCANNABINOL: POSITIVE — AB

## 2016-10-02 LAB — I-STAT CG4 LACTIC ACID, ED
Lactic Acid, Venous: 4.45 mmol/L (ref 0.5–1.9)
Lactic Acid, Venous: 7.16 mmol/L (ref 0.5–1.9)

## 2016-10-02 LAB — HEPATITIS B CORE ANTIBODY, IGM: HEP B C IGM: NEGATIVE

## 2016-10-02 LAB — APTT: aPTT: 25 seconds (ref 24–36)

## 2016-10-02 LAB — HSV(HERPES SMPLX)ABS-I+II(IGG+IGM)-BLD
HSV 1 Glycoprotein G Ab, IgG: >62.2 index — ABNORMAL HIGH (ref 0.00–0.90)
HSV 2 Glycoprotein G Ab, IgG: <0.91 index (ref 0.00–0.90)
HSVI/II Comb IgM: <0.91 Ratio (ref 0.00–0.90)

## 2016-10-02 LAB — HEPATITIS B SURFACE ANTIBODY, QUANTITATIVE

## 2016-10-02 LAB — HEPATITIS A ANTIBODY, IGM: Hep A IgM: NEGATIVE

## 2016-10-02 LAB — HEPATITIS C ANTIBODY
HCV Ab: <0.1 s/co ratio (ref 0.0–0.9)
HCV Ab: <0.1 s/co ratio (ref 0.0–0.9)

## 2016-10-02 LAB — PROTIME-INR
INR: 1.15
PROTHROMBIN TIME: 14.8 s (ref 11.4–15.2)

## 2016-10-02 LAB — HEPATITIS B CORE ANTIBODY, TOTAL: HEP B C TOTAL AB: NEGATIVE

## 2016-10-02 LAB — HEPATITIS B SURFACE ANTIGEN: HEP B S AG: NEGATIVE

## 2016-10-02 LAB — ABO/RH: ABO/RH(D): A POS

## 2016-10-02 LAB — POC OCCULT BLOOD, ED: FECAL OCCULT BLD: POSITIVE — AB

## 2016-10-02 LAB — CK: Total CK: 219 U/L (ref 49–397)

## 2016-10-02 LAB — HEPATITIS B SURFACE ANTIBODY,QUALITATIVE: Hep B S Ab: NONREACTIVE

## 2016-10-02 LAB — ETHANOL

## 2016-10-02 MED ORDER — PIPERACILLIN-TAZOBACTAM 3.375 G IVPB
3.3750 g | Freq: Once | INTRAVENOUS | Status: AC
  Administered 2016-10-02: 3.375 g via INTRAVENOUS
  Filled 2016-10-02: qty 50

## 2016-10-02 MED ORDER — SODIUM CHLORIDE 0.9 % IV SOLN
80.0000 mg | Freq: Once | INTRAVENOUS | Status: AC
  Administered 2016-10-02: 80 mg via INTRAVENOUS
  Filled 2016-10-02: qty 80

## 2016-10-02 MED ORDER — SODIUM CHLORIDE 0.9 % IV BOLUS (SEPSIS)
1000.0000 mL | Freq: Once | INTRAVENOUS | Status: AC
  Administered 2016-10-02: 1000 mL via INTRAVENOUS

## 2016-10-02 MED ORDER — VANCOMYCIN HCL IN DEXTROSE 1-5 GM/200ML-% IV SOLN
1000.0000 mg | Freq: Once | INTRAVENOUS | Status: AC
  Administered 2016-10-02: 1000 mg via INTRAVENOUS
  Filled 2016-10-02: qty 200

## 2016-10-02 MED ORDER — IOPAMIDOL (ISOVUE-300) INJECTION 61%
INTRAVENOUS | Status: AC
  Filled 2016-10-02: qty 100

## 2016-10-02 MED ORDER — ONDANSETRON HCL 4 MG/2ML IJ SOLN
4.0000 mg | Freq: Once | INTRAMUSCULAR | Status: AC
  Administered 2016-10-02: 4 mg via INTRAVENOUS
  Filled 2016-10-02: qty 2

## 2016-10-02 MED ORDER — HYDROCORTISONE 2.5 % RE CREA
TOPICAL_CREAM | Freq: Two times a day (BID) | RECTAL | Status: DC
Start: 2016-10-02 — End: 2016-10-06
  Administered 2016-10-03 – 2016-10-05 (×5): via RECTAL
  Administered 2016-10-05: 1 via RECTAL
  Administered 2016-10-06: 08:00:00 via RECTAL
  Filled 2016-10-02: qty 28.35

## 2016-10-02 MED ORDER — IOPAMIDOL (ISOVUE-300) INJECTION 61%
100.0000 mL | Freq: Once | INTRAVENOUS | Status: AC | PRN
  Administered 2016-10-02: 100 mL via INTRAVENOUS

## 2016-10-02 MED ORDER — METRONIDAZOLE IN NACL 5-0.79 MG/ML-% IV SOLN
500.0000 mg | Freq: Once | INTRAVENOUS | Status: AC
  Administered 2016-10-02: 500 mg via INTRAVENOUS
  Filled 2016-10-02: qty 100

## 2016-10-02 MED ORDER — LACTULOSE 10 GM/15ML PO SOLN
20.0000 g | Freq: Three times a day (TID) | ORAL | Status: DC
  Administered 2016-10-02 – 2016-10-05 (×8): 20 g via ORAL
  Filled 2016-10-02 (×10): qty 30

## 2016-10-02 MED ORDER — MORPHINE SULFATE (PF) 4 MG/ML IV SOLN
4.0000 mg | Freq: Once | INTRAVENOUS | Status: AC
Start: 2016-10-02 — End: 2016-10-02
  Administered 2016-10-02: 4 mg via INTRAVENOUS
  Filled 2016-10-02: qty 1

## 2016-10-02 NOTE — ED Triage Notes (Signed)
Patient has increased weakness related to liver failure.  Patient checked himself out of [facility] 10-01-16, AMA.  Patient is reporting blood in urine.   BP: 104/66 HR:56 R:16  93% on 3 liters

## 2016-10-02 NOTE — Discharge Summary (Signed)
Patient left AMA.   Name: Steven Li MRN: [id number] DOB: 1966/04/17 50 y.o. PCP: No Pcp Per Patient  Date of Admission: 09/30/2016  5:38 AM Date of Discharge: 10/02/2016 Attending Physician: No att. providers found  Discharge Diagnosis: 1. Alcoholic hepatitis Principal Problem:   Hepatitis Active Problems:   Transaminitis   Colitis   Alcohol abuse   Aortic atherosclerosis (HCC)   GERD (gastroesophageal reflux disease)   Discharge Medications: Allergies as of 10/01/2016   No Known Allergies     Medication List    ASK your doctor about these medications   bismuth subsalicylate [id number] suspension Commonly known as:  PEPTO BISMOL Take 30 mLs by mouth every 6 (six) hours as needed for indigestion.   EYE DROPS OP Place 2 drops into both eyes as needed (for red eyes).   ibuprofen 200 MG tablet Commonly known as:  ADVIL,MOTRIN Take 200 mg by mouth every 6 (six) hours as needed for headache or moderate pain.   lansoprazole 30 MG capsule Commonly known as:  PREVACID Take 1 capsule (30 mg total) by mouth daily at 12 noon.       Disposition and follow-up:   StevenSteven Li was discharged from [facility] in Critical condition.  At the hospital follow up visit please address:  1.    2.  Labs / imaging needed at time of follow-up:   3.  Pending labs/ test needing follow-up:   Follow-up Appointments:   Hospital Course by problem list:  Steven Li, 50 year old AA man with past medical history significant for alcohol abuse came to ED with worsening abdominal pain associated with nausea and vomiting since Friday. He also had 2 episodes of black colored diarrhea this morning.   1. Transaminitis. On admission he was having severely elevated AST and ALT, with a ratio of 2:1, more consistent with alcoholic hepatitis. Initially he was treated with Mucomyst with suspicion of acetaminophen toxicity, patient reported taking 2 Tylenol with ED  physician, he told us that he took 2 Advil. His acetaminophen level were negative. His transaminase  Levels started falling next day but bilirubin level Rising. There were 11.7 on the day he left AMA. Mildly elevated PTT with normal INR. Patient was seen by Dr. Heber [place], who advised him against leaving hospital as he Developed liver failure. Patient understands the danger but insist on leaving AMA.  2.Colitis. Her CT abdomen shows possible colitis at transverse colon. He was given Cipro and Flagyl for one day in the hospital.  3.Alcohol abuse. His CIWA score remains 0. He was given daily thiamine and folic acid.  Discharge Vitals:   BP 120/70   Pulse 96   Temp 98.7 F (37.1 C) (Oral)   Resp 19   Ht 5\' 9"  (1.753 m)   Wt 59 kg (130 lb)   SpO2 100%   BMI 19.20 kg/m   Gen. well-built, malnourished man, in no acute distress. Eyes. Worsening scleral icterus. Abdomen. Generalized tenderness, no rebound or guarding, nondistended, bowel sounds positive. Chest. Clear bilaterally. CV. Regular rate and rhythm. Extremities. No edema, no cyanosis, pulses 2+ bilaterally.  Pertinent Labs, Studies, and Procedures:  CMP Latest Ref Rng & Units 10/02/2016 10/01/2016 09/30/2016  Glucose 65 - 99 mg/dL 107(H) 136(H) 158(H)  BUN 6 - 20 mg/dL 21(H) <5(L) 6  Creatinine 0.61 - 1.24 mg/dL <0.30(L) 0.71 1.05  Sodium 135 - 145 mmol/L 134(L) 133(L) 135  Potassium 3.5 - 5.1  mmol/L 4.4 5.2(H) 4.5  Chloride 101 - 111 mmol/L 96(L) 97(L) 97(L)  CO2 22 - 32 mmol/L 26 25 26   Calcium 8.9 - 10.3 mg/dL 9.3 8.7(L) 9.0  Total Protein 6.5 - 8.1 g/dL 6.2(L) 5.6(L) 6.6  Total Bilirubin 0.3 - 1.2 mg/dL 28.9(HH) 11.7(H) 3.9(H)  Alkaline Phos 38 - 126 U/L 139(H) 107 124  AST 15 - 41 U/L 492(H) 795(H) 1,657(H)  ALT 17 - 63 U/L 338(H) 486(H) 728(H)    Discharge Instructions:   Signed: Lorella Nimrod, MD 10/02/2016, 1:20 PM   Pager: [phone]

## 2016-10-02 NOTE — ED Notes (Signed)
Dr. Ashok Cordia came to bedside to speak with patient. Provided ice water with permission from provider.

## 2016-10-02 NOTE — ED Notes (Signed)
Provided patient a phone to call his sister.

## 2016-10-02 NOTE — ED Notes (Signed)
MD made aware of critical lab results  

## 2016-10-02 NOTE — ED Provider Notes (Signed)
[facility] DEPT Provider Note   CSN: [id number] Arrival date & time: 10/02/16  1005     History   Chief Complaint Chief Complaint  Patient presents with  . Hematuria    HPI Steven Li is a 50 y.o. male.  HPI Patient was admitted on 12/25 for abdominal pain and vomiting. Found to have significantly elevated liver enzymes and colitis. Patient left AMA yesterday. States he's had episodes of vomiting this morning with some red blood in the vomit. He's also had some dark stools and then gross red blood in the urine. States he has ongoing abdominal pain that is unchanged. States he's had the pain for 40 years. Admits to drinking a sixpack of beer daily. Has not had any alcohol since prior to admission. Denies taking any Tylenol or ibuprofen. Has some generalized weakness and lightheadedness especially with standing. Denies chest pain. Past Medical History:  Diagnosis Date  . ETOH abuse   . Hypertension     Patient Active Problem List   Diagnosis Date Noted  . Hepatitis 10/01/2016  . Alcohol abuse 10/01/2016  . Aortic atherosclerosis ([place]) 10/01/2016  . GERD (gastroesophageal reflux disease) 10/01/2016  . Transaminitis 09/30/2016  . Colitis     History reviewed. No pertinent surgical history.     Home Medications    Prior to Admission medications   Medication Sig Start Date End Date Taking? Authorizing Provider  bismuth subsalicylate (PEPTO BISMOL) 262 MG/15ML suspension Take 30 mLs by mouth every 6 (six) hours as needed for indigestion.   Yes Historical Provider, MD  ibuprofen (ADVIL,MOTRIN) 200 MG tablet Take 200 mg by mouth every 6 (six) hours as needed for headache or moderate pain.   Yes Historical Provider, MD  lansoprazole (PREVACID) 30 MG capsule Take 1 capsule (30 mg total) by mouth daily at 12 noon. Patient taking differently: Take 30 mg by mouth daily as needed (heartburn).  07/25/14  Yes Julianne Rice, MD  Tetrahydrozoline HCl (EYE DROPS OP) Place 2  drops into both eyes as needed (for red eyes).   Yes Historical Provider, MD    Family History No family history on file.  Social History Social History  Substance Use Topics  . Smoking status: Current Every Day Smoker    Packs/day: 1.00    Types: Cigarettes  . Smokeless tobacco: Never Used  . Alcohol use 3.0 oz/week    5 Cans of beer per week     Allergies   Patient has no known allergies.   Review of Systems Review of Systems  Constitutional: Positive for fatigue. Negative for chills and fever.  Respiratory: Negative for shortness of breath.   Cardiovascular: Negative for chest pain.  Gastrointestinal: Positive for abdominal distention, abdominal pain, blood in stool, diarrhea, nausea and vomiting. Negative for constipation.  Genitourinary: Positive for hematuria. Negative for dysuria, flank pain and frequency.  Musculoskeletal: Positive for back pain. Negative for myalgias and neck pain.  Skin: Negative for rash and wound.  Neurological: Positive for dizziness, weakness (generalized) and light-headedness. Negative for syncope, numbness and headaches.  All other systems reviewed and are negative.    Physical Exam Updated Vital Signs BP 124/84 (BP Location: Left Arm)   Pulse 86   Temp 97.7 F (36.5 C) (Oral)   Resp 16   Ht 5\' 9"  (1.753 m)   Wt 130 lb (59 kg)   SpO2 93%   BMI 19.20 kg/m   Physical Exam  Constitutional: He is oriented to person, place, and time. He appears  well-developed and well-nourished.  HENT:  Head: Normocephalic and atraumatic.  Mouth/Throat: Oropharynx is clear and moist. No oropharyngeal exudate.  Eyes: EOM are normal. Pupils are equal, round, and reactive to light. Scleral icterus is present.  Neck: Normal range of motion. Neck supple.  Cardiovascular: Normal rate and regular rhythm.  Exam reveals no gallop and no friction rub.   No murmur heard. Pulmonary/Chest: Effort normal. He has wheezes.  Few scattered expiratory wheezes    Abdominal: Soft. Bowel sounds are normal. There is tenderness (patient with mild upper abdominal distention and tenderness to palpation.). There is no rebound and no guarding.  Musculoskeletal: Normal range of motion. He exhibits no edema or tenderness.  No CVA tenderness. Mild lower lumbar midline tenderness to palpation.  Neurological: He is alert and oriented to person, place, and time.  5/5 motor in all extremities. Sensation intact. No tremor noted.  Skin: Skin is warm and dry. Capillary refill takes less than 2 seconds. No rash noted. No erythema.  Psychiatric: He has a normal mood and affect. His behavior is normal.  Nursing note and vitals reviewed.    ED Treatments / Results  Labs (all labs ordered are listed, but only abnormal results are displayed) Labs Reviewed  URINALYSIS, ROUTINE W REFLEX MICROSCOPIC - Abnormal; Notable for the following:       Result Value   Color, Urine RED (*)    APPearance CLOUDY (*)    Hgb urine dipstick LARGE (*)    Bilirubin Urine SMALL (*)    Protein, ur 100 (*)    Bacteria, UA FEW (*)    Squamous Epithelial / LPF 0-5 (*)    All other components within normal limits  CBC WITH DIFFERENTIAL/PLATELET - Abnormal; Notable for the following:    WBC 19.9 (*)    RBC 3.06 (*)    Hemoglobin 10.2 (*)    HCT 30.5 (*)    Platelets 83 (*)    nRBC 1 (*)    Neutro Abs 17.1 (*)    All other components within normal limits  COMPREHENSIVE METABOLIC PANEL - Abnormal; Notable for the following:    Sodium 134 (*)    Chloride 96 (*)    Glucose, Bld 107 (*)    BUN 21 (*)    Creatinine, Ser <0.30 (*)    Total Protein 6.2 (*)    Albumin 3.4 (*)    AST 492 (*)    ALT 338 (*)    Alkaline Phosphatase 139 (*)    Total Bilirubin 28.9 (*)    All other components within normal limits  RAPID URINE DRUG SCREEN, HOSP PERFORMED - Abnormal; Notable for the following:    Benzodiazepines POSITIVE (*)    Tetrahydrocannabinol POSITIVE (*)    All other components  within normal limits  I-STAT CG4 LACTIC ACID, ED - Abnormal; Notable for the following:    Lactic Acid, Venous 4.45 (*)    All other components within normal limits  I-STAT TROPOININ, ED - Abnormal; Notable for the following:    Troponin i, poc 0.67 (*)    All other components within normal limits  POC OCCULT BLOOD, ED - Abnormal; Notable for the following:    Fecal Occult Bld POSITIVE (*)    All other components within normal limits  I-STAT CG4 LACTIC ACID, ED - Abnormal; Notable for the following:    Lactic Acid, Venous 7.16 (*)    All other components within normal limits  I-STAT TROPOININ, ED - Abnormal; Notable for the  following:    Troponin i, poc 0.73 (*)    All other components within normal limits  PROTIME-INR  ETHANOL  APTT  CK  OCCULT BLOOD X 1 CARD TO LAB, STOOL  TYPE AND SCREEN  ABO/RH    EKG  EKG Interpretation  Date/Time:  Wednesday October 02 2016 11:47:15 EST Ventricular Rate:  103 PR Interval:    QRS Duration: 87 QT Interval:  385 QTC Calculation: 504 R Axis:   73 Text Interpretation:  Sinus tachycardia Probable left atrial enlargement Left ventricular hypertrophy Prolonged QT interval Confirmed by Lita Mains  MD, Roselina Burgueno ([id number]) on 10/02/2016 12:28:22 PM       Radiology Dg Chest 2 View  Result Date: 10/02/2016 CLINICAL DATA:  Cough. EXAM: CHEST  2 VIEW COMPARISON:  09/30/2016. FINDINGS: Mediastinum and hilar structures are normal. Lungs are clear. No pleural effusion or pneumothorax. Heart size normal. No acute bony abnormality . IMPRESSION: No acute cardiopulmonary disease. Electronically Signed   By: Marcello Moores  Register   On: 10/02/2016 12:23   Ct Abdomen Pelvis W Contrast  Result Date: 10/02/2016 CLINICAL DATA:  Pelvic and low back pain for 5 days. EXAM: CT ABDOMEN AND PELVIS WITH CONTRAST TECHNIQUE: Multidetector CT imaging of the abdomen and pelvis was performed using the standard protocol following bolus administration of intravenous contrast.  CONTRAST:  135mL ISOVUE-300 IOPAMIDOL (ISOVUE-300) INJECTION 61% COMPARISON:  09/30/2016 FINDINGS: Lower chest: Lung bases are clear. No effusions. Heart is normal size. Hepatobiliary: Diffuse fatty infiltration of the liver. No focal hepatic abnormality or biliary ductal dilatation. Gallbladder unremarkable. Pancreas: No focal abnormality or ductal dilatation. Spleen: No focal abnormality.  Normal size. Adrenals/Urinary Tract: Small low-density area in the midpole of the left kidney, likely small cysts. No hydronephrosis, renal for renal stones. Adrenal glands and urinary bladder are unremarkable. Stomach/Bowel: Appendix is normal. Stomach, large and small bowel grossly unremarkable. Previously seen apparent wall thickening in the transverse colon no longer visualized. Vascular/Lymphatic: Dense aortic and iliac calcifications. No aneurysm or adenopathy. Reproductive: No visible focal abnormality. Other: No free fluid or free air. Musculoskeletal: No acute bony abnormality or focal bone lesion. IMPRESSION: Diffuse fatty infiltration of the liver. No acute findings. Electronically Signed   By: Rolm Baptise M.D.   On: 10/02/2016 14:29    Procedures Procedures (including critical care time)  Medications Ordered in ED Medications  vancomycin (VANCOCIN) IVPB 1000 mg/200 mL premix (1,000 mg Intravenous New Bag/Given 10/02/16 1455)  metroNIDAZOLE (FLAGYL) IVPB 500 mg (500 mg Intravenous New Bag/Given 10/02/16 1455)  iopamidol (ISOVUE-300) 61 % injection (not administered)  sodium chloride 0.9 % bolus 1,000 mL (0 mLs Intravenous Stopped 10/02/16 1253)  pantoprazole (PROTONIX) 80 mg in sodium chloride 0.9 % 100 mL IVPB (0 mg Intravenous Stopped 10/02/16 1228)  ondansetron (ZOFRAN) injection 4 mg (4 mg Intravenous Given 10/02/16 1208)  sodium chloride 0.9 % bolus 1,000 mL (0 mLs Intravenous Stopped 10/02/16 1448)  sodium chloride 0.9 % bolus 1,000 mL (0 mLs Intravenous Stopped 10/02/16 1505)   piperacillin-tazobactam (ZOSYN) IVPB 3.375 g (0 g Intravenous Stopped 10/02/16 1453)  iopamidol (ISOVUE-300) 61 % injection 100 mL (100 mLs Intravenous Contrast Given 10/02/16 1413)  morphine 4 MG/ML injection 4 mg (4 mg Intravenous Given 10/02/16 1454)   CRITICAL CARE Performed by: Lita Mains, Chariti Havel Total critical care time:40 minutes Critical care time was exclusive of separately billable procedures and treating other patients. Critical care was necessary to treat or prevent imminent or life-threatening deterioration. Critical care was time spent personally by me on the  following activities: development of treatment plan with patient and/or surrogate as well as nursing, discussions with consultants, evaluation of patient's response to treatment, examination of patient, obtaining history from patient or surrogate, ordering and performing treatments and interventions, ordering and review of laboratory studies, ordering and review of radiographic studies, pulse oximetry and re-evaluation of patient's condition.  Initial Impression / Assessment and Plan / ED Course  I have reviewed the triage vital signs and the nursing notes.  Pertinent labs & imaging results that were available during my care of the patient were reviewed by me and considered in my medical decision making (see chart for details).  Clinical Course    Patient's vital signs remained stable in the emergency department. No active bleeding. Has significant elevation in white blood cell count and lactic acid. Repeat abdominal CT scan without any acute findings. Patient also has mild elevation of troponin with uncertain significance. No change in EKG. Patient denies chest pain. States his abdominal pain is same pain he's had for the past 40 years. Discussed with gastroenterology and will consult on the patient. Also spoken with internal medicine resident, Dr Lovena Le. Will accept in transfer to stepdown bed on behalf of Dr. Beryle Beams.     Final Clinical Impressions(s) / ED Diagnoses   Final diagnoses:  Acute upper GI bleed  Abnormal LFTs  Elevated troponin    New Prescriptions New Prescriptions   No medications on file     Julianne Rice, MD 10/02/16 1512

## 2016-10-02 NOTE — ED Notes (Signed)
Assisted patient to the restroom and [place], NT assisted patient back to stretcher.

## 2016-10-02 NOTE — ED Notes (Signed)
Lab verbalizes adding on CK.

## 2016-10-02 NOTE — ED Notes (Signed)
Went into patients room, he demanded having pain medication and refused to take his LACTULOSE. He has threaten to leave if he does not get his medication. Paged Dr. Dr. Fuller Plan and spoke with physician. He reports to document patient refused and attempt to speak with [facility] for pain management.

## 2016-10-02 NOTE — ED Notes (Signed)
Provided patient two warm blankets.  

## 2016-10-02 NOTE — ED Notes (Signed)
MD aware of I-STAT results 

## 2016-10-02 NOTE — ED Notes (Signed)
Spoke with bed placement and at this moment is no stepdown beds. Also, bed placement could not give me an estimated time for a bed. Informed Dr. Ashok Cordia.

## 2016-10-02 NOTE — ED Notes (Signed)
Nurse is getting an IV and collecting labs

## 2016-10-02 NOTE — ED Notes (Signed)
Patient transported to CT 

## 2016-10-02 NOTE — Consult Note (Addendum)
Referring Provider: [facility]   Primary Care Physician:  No PCP Per Patient Primary Gastroenterologist:  Althia Forts Reason for Consultation:  Hematemesis, elevated LFTs  HPI: Steven Li is a 50 y.o. male who was seen in consultation at [facility] on 09/30/16 by Dr. Benson Norway who was covering for our practice. Patient has a history of ETOH abuse, was seen by GI for abnormal LFTs.  Acute hepatitis studies were negative. UDS + for benzos and marijuana. Denied significant tylenol use and level was < 10, No liver abnormalities were seen on CTscan of abdomen but there was suggestion of possible mild transverse colitis. Patient was started empirically on cipro and flagyl. He reported black stools but had taken bismuth.  We saw patient yesterday in follow up rounds but patient decided to leave AMA shortly afterwards.   Patient represented to ED today, this time to [facility]. He came with hematemesis, dark stools and gross hematuria. Patient left AMA yesterday because he needed to go to work but woke up this am with worsening symptoms. He had blood in vomit this am. He describes dark balls of stool with some red blood. Patient has chronic abdominal pain but states it is worse at present. He has constant pain across lower abdomen. He denies weight loss. Patient has always been thin, started drinking heavy at age 81. Patient didn't drink when he left the hospital yesterday.   Past Medical History:  Diagnosis Date  . ETOH abuse   . Hypertension     History reviewed. No pertinent surgical history.  Prior to Admission medications   Medication Sig Start Date End Date Taking? Authorizing Provider  bismuth subsalicylate (PEPTO BISMOL) 262 MG/15ML suspension Take 30 mLs by mouth every 6 (six) hours as needed for indigestion.   Yes Historical Provider, MD  ibuprofen (ADVIL,MOTRIN) 200 MG tablet Take 200 mg by mouth every 6 (six) hours as needed for headache or moderate pain.   Yes Historical Provider, MD    lansoprazole (PREVACID) 30 MG capsule Take 1 capsule (30 mg total) by mouth daily at 12 noon. Patient taking differently: Take 30 mg by mouth daily as needed (heartburn).  07/25/14  Yes Julianne Rice, MD  Tetrahydrozoline HCl (EYE DROPS OP) Place 2 drops into both eyes as needed (for red eyes).   Yes Historical Provider, MD    Current Facility-Administered Medications  Medication Dose Route Frequency Provider Last Rate Last Dose  . iopamidol (ISOVUE-300) 61 % injection           . metroNIDAZOLE (FLAGYL) IVPB 500 mg  500 mg Intravenous Once Julianne Rice, MD 100 mL/hr at 10/02/16 1455 500 mg at 10/02/16 1455  . vancomycin (VANCOCIN) IVPB 1000 mg/200 mL premix  1,000 mg Intravenous Once Julianne Rice, MD 200 mL/hr at 10/02/16 1455 1,000 mg at 10/02/16 1455   Current Outpatient Prescriptions  Medication Sig Dispense Refill  . bismuth subsalicylate (PEPTO BISMOL) 262 MG/15ML suspension Take 30 mLs by mouth every 6 (six) hours as needed for indigestion.    Marland Kitchen ibuprofen (ADVIL,MOTRIN) 200 MG tablet Take 200 mg by mouth every 6 (six) hours as needed for headache or moderate pain.    Marland Kitchen lansoprazole (PREVACID) 30 MG capsule Take 1 capsule (30 mg total) by mouth daily at 12 noon. (Patient taking differently: Take 30 mg by mouth daily as needed (heartburn). ) 30 capsule 0  . Tetrahydrozoline HCl (EYE DROPS OP) Place 2 drops into both eyes as needed (for red eyes).  Allergies as of 10/02/2016  . (No Known Allergies)    No family history on file.  Social History   Social History  . Marital status: Single    Spouse name: N/A  . Number of children: N/A  . Years of education: N/A   Occupational History  . Not on file.   Social History Main Topics  . Smoking status: Current Every Day Smoker    Packs/day: 1.00    Types: Cigarettes  . Smokeless tobacco: Never Used  . Alcohol use 3.0 oz/week    5 Cans of beer per week  . Drug use:     Types: Marijuana  . Sexual activity: Not on  file   Other Topics Concern  . Not on file   Social History Narrative  . No narrative on file    Review of Systems: All systems reviewed and negative except where noted in HPI.  Physical Exam: Vital signs in last 24 hours: Temp:  [97.7 F (36.5 C)] 97.7 F (36.5 C) (12/27 1022) Pulse Rate:  [83-110] 86 (12/27 1505) Resp:  [13-16] 16 (12/27 1505) BP: (90-128)/(64-95) 124/84 (12/27 1505) SpO2:  [92 %-94 %] 93 % (12/27 1402) Weight:  [130 lb (59 kg)] 130 lb (59 kg) (12/27 1024)   General:   Alert,  Thin black male in NAD Head:  Normocephalic and atraumatic. Eyes:  Icteric sclera. Ears:  Normal auditory acuity. Nose:  No deformity, discharge,  or lesions. Mouth:  No deformity or lesions.   Neck:  Supple; no masses or thyromegaly. Lungs:  Clear throughout to auscultation.   No wheezes, crackles, or rhonchi. Heart:  Regular rate and rhythm; no murmurs, clicks, rubs,  or gallops. Abdomen:  Soft,nontender, BS active,nonpalp mass or hsm.   Rectal:  Large protruding hemorrhoids, possibly mild rectal prolapse. No blood in vault  Msk:  Symmetrical without gross deformities. . Pulses:  Normal pulses noted. Extremities:  Without clubbing or edema. Neurologic:  Alert and  oriented x4;  grossly normal neurologically.no asterixis.mentation sluggish and speech thick Skin:  Intact without significant lesions or rashes..   Intake/Output from previous day: No intake/output data recorded. Intake/Output this shift: No intake/output data recorded.  Lab Results:  Recent Labs  09/30/16 0556 10/01/16 0542 10/02/16 1204  WBC 6.3 9.4 19.9*  HGB 13.7 12.9* 10.2*  HCT 37.7* 37.1* 30.5*  PLT 218 168 83*   BMET  Recent Labs  09/30/16 0556 10/01/16 0542 10/02/16 1204  NA 135 133* 134*  K 4.5 5.2* 4.4  CL 97* 97* 96*  CO2 26 25 26   GLUCOSE 158* 136* 107*  BUN 6 <5* 21*  CREATININE 1.05 0.71 <0.30*  CALCIUM 9.0 8.7* 9.3   LFT  Recent Labs  10/02/16 1204  PROT 6.2*  ALBUMIN  3.4*  AST 492*  ALT 338*  ALKPHOS 139*  BILITOT 28.9*   PT/INR  Recent Labs  09/30/16 0849 10/02/16 1204  LABPROT 17.6* 14.8  INR 1.43 1.15   Hepatitis Panel  Recent Labs  09/30/16 1102 09/30/16 1351  HEPBSAG Negative  --   HCVAB <0.1 <0.1  HEPAIGM Negative  --   HEPBIGM  --  Negative     Studies/Results: Dg Chest 2 View  Result Date: 10/02/2016 CLINICAL DATA:  Cough. EXAM: CHEST  2 VIEW COMPARISON:  09/30/2016. FINDINGS: Mediastinum and hilar structures are normal. Lungs are clear. No pleural effusion or pneumothorax. Heart size normal. No acute bony abnormality . IMPRESSION: No acute cardiopulmonary disease. Electronically Signed   By: Marcello Moores  Register   On: 10/02/2016 12:23   Ct Abdomen Pelvis W Contrast  Result Date: 10/02/2016 CLINICAL DATA:  Pelvic and low back pain for 5 days. EXAM: CT ABDOMEN AND PELVIS WITH CONTRAST TECHNIQUE: Multidetector CT imaging of the abdomen and pelvis was performed using the standard protocol following bolus administration of intravenous contrast. CONTRAST:  19mL ISOVUE-300 IOPAMIDOL (ISOVUE-300) INJECTION 61% COMPARISON:  09/30/2016 FINDINGS: Lower chest: Lung bases are clear. No effusions. Heart is normal size. Hepatobiliary: Diffuse fatty infiltration of the liver. No focal hepatic abnormality or biliary ductal dilatation. Gallbladder unremarkable. Pancreas: No focal abnormality or ductal dilatation. Spleen: No focal abnormality.  Normal size. Adrenals/Urinary Tract: Small low-density area in the midpole of the left kidney, likely small cysts. No hydronephrosis, renal for renal stones. Adrenal glands and urinary bladder are unremarkable. Stomach/Bowel: Appendix is normal. Stomach, large and small bowel grossly unremarkable. Previously seen apparent wall thickening in the transverse colon no longer visualized. Vascular/Lymphatic: Dense aortic and iliac calcifications. No aneurysm or adenopathy. Reproductive: No visible focal abnormality.  Other: No free fluid or free air. Musculoskeletal: No acute bony abnormality or focal bone lesion. IMPRESSION: Diffuse fatty infiltration of the liver. No acute findings. Electronically Signed   By: Rolm Baptise M.D.   On: 10/02/2016 14:29    IMPRESSION / PLAN:   1. 50 yo male with markedly abnormal LFTs, mixed pattern. Initially more of a hepatocellular pattern of injury. Transaminases trending down but bilirubin rapidly rising over last 3 days ( 3.9--> 11.7----> 29).  No masses  / obstructive processes on CTscan. Tylenol seems unlikely. Ischemic injury seems unlikely. Acute viral hepatitis negative.   Transaminases not usually this elevated with ETOH hepatitis (AST 1650 , ALT 728 initially).  Smokes marijuana but denies illicit drug use.   2. Colitis. Mild transverse colitis seen on CTscan a couple of days ago. Repeat scan today negative.   3. Hematemesis. His hgb is down from 13.2 on 12/25 to 10.2.. Wall thickening of GE junction on CTscan. Probably incidental finding but will likely need EGD at some point.  4. Rectal bleeding. Stool black but takes pepto. Sounds like he is constipated and definitely has large hemorrhoids on exam.  -When nausea / vomiting resolve, start Miralax. -steroid cream / suppositories.    5. Elevated lactic acid 7.16, rising troponin, leukocytosis, WBC 9.4 yesterday ---> 19.9 today with thrombocytopenia. Is he septic? Getting Vanco, Zosyn and flagyl.   Tye Savoy NP  10/02/2016, 3:06 PM  Pager number [phone]   I have reviewed the entire case in detail with the above APP and discussed the plan in detail.  Therefore, I agree with the diagnoses recorded above. In addition,  I have personally interviewed and examined the patient and have personally reviewed any abdominal/pelvic CT scan images.  My additional thoughts are as follows:  Acute hepatic injury with elevated LFTs.  I still contend that this cannot be purely from alcohol, given the degree of  transaminitis.  There was likely another drug/toxin, ischemia.  Acute viral hep panel negative.   Whatever the initial insult, it occurred a few days ago, so we will not be able to determine what it was.  No tox screen done with last admission.    Fortunately his INR is normal, because if he had gone into fulminant hepatic failure he would not be a transplant candidate due to alcohol and drug use.  I think he is somewhat encephalopathic, so I will start lactulose.  He does not appear to be  having a brisk GI bleed, so holding off on EGD for now.  Twice daily PPI, clear liquid diet, serial CBC and INR in AM.  We will follow closely as he is very ill.    Nelida Meuse III Pager [phone]  Mon-Fri 8a-5p [phone] after 5p, weekends, holidays

## 2016-10-02 NOTE — ED Notes (Signed)
Provided two warm blankets.  

## 2016-10-03 ENCOUNTER — Encounter (HOSPITAL_COMMUNITY): Payer: Self-pay | Admitting: Family Medicine

## 2016-10-03 DIAGNOSIS — D649 Anemia, unspecified: Secondary | ICD-10-CM

## 2016-10-03 DIAGNOSIS — R778 Other specified abnormalities of plasma proteins: Secondary | ICD-10-CM

## 2016-10-03 DIAGNOSIS — K759 Inflammatory liver disease, unspecified: Secondary | ICD-10-CM

## 2016-10-03 DIAGNOSIS — R7989 Other specified abnormal findings of blood chemistry: Secondary | ICD-10-CM

## 2016-10-03 LAB — COMPREHENSIVE METABOLIC PANEL
ALT: 219 U/L — ABNORMAL HIGH (ref 17–63)
ANION GAP: 11 (ref 5–15)
AST: 394 U/L — ABNORMAL HIGH (ref 15–41)
Albumin: 2.8 g/dL — ABNORMAL LOW (ref 3.5–5.0)
Alkaline Phosphatase: 128 U/L — ABNORMAL HIGH (ref 38–126)
BILIRUBIN TOTAL: 32.7 mg/dL — AB (ref 0.3–1.2)
BUN: 16 mg/dL (ref 6–20)
CO2: 26 mmol/L (ref 22–32)
Calcium: 8.7 mg/dL — ABNORMAL LOW (ref 8.9–10.3)
Chloride: 100 mmol/L — ABNORMAL LOW (ref 101–111)
Creatinine, Ser: <0.3 mg/dL — ABNORMAL LOW (ref 0.61–1.24)
Glucose, Bld: 108 mg/dL — ABNORMAL HIGH (ref 65–99)
POTASSIUM: 3.1 mmol/L — AB (ref 3.5–5.1)
Sodium: 137 mmol/L (ref 135–145)
TOTAL PROTEIN: 5 g/dL — AB (ref 6.5–8.1)

## 2016-10-03 LAB — CBC WITH DIFFERENTIAL/PLATELET
Basophils Absolute: 0 10*3/uL (ref 0.0–0.1)
Basophils Relative: 0 %
EOS ABS: 0 10*3/uL (ref 0.0–0.7)
Eosinophils Relative: 0 %
HCT: 20.9 % — ABNORMAL LOW (ref 39.0–52.0)
Hemoglobin: 7 g/dL — ABNORMAL LOW (ref 13.0–17.0)
LYMPHS PCT: 7 %
Lymphs Abs: 1.1 10*3/uL (ref 0.7–4.0)
MCH: 33.5 pg (ref 26.0–34.0)
MCHC: 33.5 g/dL (ref 30.0–36.0)
MCV: 100 fL (ref 78.0–100.0)
MONOS PCT: 6 %
Monocytes Absolute: 0.9 10*3/uL (ref 0.1–1.0)
NEUTROS ABS: 13 10*3/uL — AB (ref 1.7–7.7)
Neutrophils Relative %: 87 %
PLATELETS: 62 10*3/uL — AB (ref 150–400)
RBC: 2.09 MIL/uL — AB (ref 4.22–5.81)
RDW: 15 % (ref 11.5–15.5)
WBC: 15 10*3/uL — AB (ref 4.0–10.5)
nRBC: 19 /100 WBC — ABNORMAL HIGH

## 2016-10-03 LAB — LACTIC ACID, PLASMA
LACTIC ACID, VENOUS: 3.1 mmol/L — AB (ref 0.5–1.9)
Lactic Acid, Venous: 1.9 mmol/L (ref 0.5–1.9)

## 2016-10-03 LAB — MRSA PCR SCREENING: MRSA BY PCR: NEGATIVE

## 2016-10-03 LAB — CBC
HCT: 20.5 % — ABNORMAL LOW (ref 39.0–52.0)
Hemoglobin: 7.1 g/dL — ABNORMAL LOW (ref 13.0–17.0)
MCH: 34.5 pg — AB (ref 26.0–34.0)
MCHC: 34.6 g/dL (ref 30.0–36.0)
MCV: 99.5 fL (ref 78.0–100.0)
PLATELETS: 52 10*3/uL — AB (ref 150–400)
RBC: 2.06 MIL/uL — AB (ref 4.22–5.81)
RDW: 15 % (ref 11.5–15.5)
WBC: 15.2 10*3/uL — ABNORMAL HIGH (ref 4.0–10.5)

## 2016-10-03 LAB — TROPONIN I: TROPONIN I: 0.32 ng/mL — AB (ref <0.03)

## 2016-10-03 LAB — PROTIME-INR
INR: 1.14
PROTHROMBIN TIME: 14.7 s (ref 11.4–15.2)

## 2016-10-03 LAB — OCCULT BLOOD X 1 CARD TO LAB, STOOL: Fecal Occult Bld: POSITIVE — AB

## 2016-10-03 LAB — PREPARE RBC (CROSSMATCH)

## 2016-10-03 MED ORDER — SODIUM CHLORIDE 0.9 % IV SOLN: INTRAVENOUS | Status: DC

## 2016-10-03 MED ORDER — SODIUM CHLORIDE 0.9 % IV SOLN
Freq: Once | INTRAVENOUS | Status: AC
  Administered 2016-10-03: 17:00:00 via INTRAVENOUS

## 2016-10-03 MED ORDER — SODIUM CHLORIDE 0.9 % IV SOLN
INTRAVENOUS | Status: DC
  Administered 2016-10-03 – 2016-10-05 (×5): via INTRAVENOUS

## 2016-10-03 MED ORDER — SODIUM CHLORIDE 0.9% FLUSH
3.0000 mL | Freq: Two times a day (BID) | INTRAVENOUS | Status: DC
  Administered 2016-10-03 – 2016-10-06 (×7): 3 mL via INTRAVENOUS

## 2016-10-03 MED ORDER — PIPERACILLIN-TAZOBACTAM 3.375 G IVPB 30 MIN
3.3750 g | Freq: Once | INTRAVENOUS | Status: AC
  Administered 2016-10-03: 3.375 g via INTRAVENOUS
  Filled 2016-10-03: qty 50

## 2016-10-03 MED ORDER — SODIUM CHLORIDE 0.9 % IV SOLN
Freq: Once | INTRAVENOUS | Status: AC
  Administered 2016-10-03: 14:00:00 via INTRAVENOUS

## 2016-10-03 MED ORDER — VANCOMYCIN HCL 500 MG IV SOLR
500.0000 mg | Freq: Three times a day (TID) | INTRAVENOUS | Status: DC
  Administered 2016-10-03 – 2016-10-05 (×5): 500 mg via INTRAVENOUS
  Filled 2016-10-03 (×9): qty 500

## 2016-10-03 MED ORDER — POTASSIUM CHLORIDE CRYS ER 20 MEQ PO TBCR
40.0000 meq | EXTENDED_RELEASE_TABLET | Freq: Once | ORAL | Status: AC
  Administered 2016-10-03: 40 meq via ORAL
  Filled 2016-10-03: qty 2

## 2016-10-03 MED ORDER — PROMETHAZINE HCL 25 MG PO TABS: 12.5000 mg | ORAL_TABLET | Freq: Four times a day (QID) | ORAL | Status: DC | PRN

## 2016-10-03 MED ORDER — PANTOPRAZOLE SODIUM 40 MG IV SOLR
40.0000 mg | Freq: Two times a day (BID) | INTRAVENOUS | Status: DC
  Administered 2016-10-03 – 2016-10-04 (×3): 40 mg via INTRAVENOUS
  Filled 2016-10-03 (×3): qty 40

## 2016-10-03 MED ORDER — VANCOMYCIN HCL IN DEXTROSE 1-5 GM/200ML-% IV SOLN
1000.0000 mg | Freq: Once | INTRAVENOUS | Status: AC
  Administered 2016-10-03: 1000 mg via INTRAVENOUS
  Filled 2016-10-03: qty 200

## 2016-10-03 MED ORDER — PIPERACILLIN-TAZOBACTAM 3.375 G IVPB
3.3750 g | Freq: Three times a day (TID) | INTRAVENOUS | Status: DC
  Administered 2016-10-03 – 2016-10-06 (×8): 3.375 g via INTRAVENOUS
  Filled 2016-10-03 (×8): qty 50

## 2016-10-03 MED ORDER — TRAMADOL HCL 50 MG PO TABS
50.0000 mg | ORAL_TABLET | Freq: Four times a day (QID) | ORAL | Status: DC | PRN
  Administered 2016-10-03 – 2016-10-05 (×3): 50 mg via ORAL
  Filled 2016-10-03 (×3): qty 1

## 2016-10-03 NOTE — Progress Notes (Signed)
Critical values texted to Dr. Sarajane Jews; Troponin 0.32 and Lactic Acid 3.1. Plan to transfuse 2 units PRBC.

## 2016-10-03 NOTE — ED Notes (Signed)
ADMISSION Provider at bedside. 

## 2016-10-03 NOTE — ED Notes (Signed)
Breakfast Giving

## 2016-10-03 NOTE — ED Notes (Signed)
Internal medicine called for Dr Laverta Baltimore.

## 2016-10-03 NOTE — ED Provider Notes (Signed)
Blood pressure 119/91, pulse 103, temperature 98.3 F (36.8 C), temperature source Oral, resp. rate 14, height 5\' 9"  (1.753 m), weight 130 lb (59 kg), SpO2 96 %.  Assuming care from Dr. Regenia Skeeter.  In short, Steven Li is a 50 y.o. male with a chief complaint of Hematuria .  Refer to the original H&P for additional details.  The current plan of care is to follow up with IM team and see if patient will be able to go to stepdown bed.  08:00 AM No Stepdown beds at [facility]. No clear timeline for getting one. Paged hospitalist for discussion.   Discussed case with [facility]. They will see the patient and place admission orders here with no stepdown or telemetry beds at [facility]. Appreciate assistance from [facility].  Nanda Quinton, MD   Margette Fast, MD 10/03/16 1005

## 2016-10-03 NOTE — ED Notes (Signed)
NPO

## 2016-10-03 NOTE — H&P (Addendum)
History and Physical  Steven Li [id number] DOB: 03-May-1966 DOA: 10/02/2016  PCP: No PCP Per Patient  Patient coming from: home  Chief Complaint: eyes yellow and stomach hurting  HPI:  50 year old man PMH alcoholism, presented initially Christmas day with abdominal pain, nausea or vomiting. Admitted at [facility], subsequently left [facility] 12/26. Presented December 27 to the emergency department at [facility], was accepted to a stepdown bed at [facility] that day by internal medicine. However as of the time of this dictation, there is no stepdown or telemetry bed available at [facility]. Therefore the hospitalist service at [facility] was first contacted at [phone] 12/27 and request for admission made at that time.  Hospital course summarized below. The patient reports symptoms began 12/23 with diffuse abdominal pain, vomiting of blood, hematuria, blood per rectum and precordial chest pain. Since that time his symptoms have only worsened. No specific aggravating or alleviating factors. Associated symptoms include ongoing vomiting including this morning. Bowels are moving. He volunteers that he is alcoholic, last drink [id number]. Took 1 tablet of Tylenol. No significant NSAID use. Denies unusual medicine or food ingestion. Also smokes marijuana and cigarettes. Chest pain has been constant in nature, described as severe, unable to characterize.  He continues to have epigastric and generalized abdominal pain, precordial chest pain.  Chart review summary:  Admitted [facility] 12/25, left AMA 12/26; treated for mixed elevation of LFTs of unclear etiology (multifactorial per GI), colitis of transverse colon with Cipro/Flagyl, CIWA for known alcohol abuse.  ED Course: ED visit [facility] 12/27 for abd pain, vomiting. Found to have elevated lactic acid, elevated troponin, elevated LFTs. EDP d/w [facility] internal medicine who accepted to SDU at [facility] . However no beds are available.  During his time in the emergency department for  the last 24 hours, afebrile, vital signs stable. Treated with lactulose, morphine, metronidazole, Protonix, Zosyn, vancomycin. Initially refused lactulose, demanded pain medication. At the time of this dictation, vital signs remained stable. Pertinent labs: Potassium this morning 3.1, remainder basic metabolic panel unremarkable. Anion gap is normal. Transaminases are trending down, ALT 219, AST 394, alkaline phosphatase 128. However, total bilirubin trending upwards, 32.7. Initial troponin 0.67, next troponin 0.73, no troponins drawn in the last 16 hours. Initial lactic acid was 4.45, on recheck 7.16, no further data available at time of dictation. WBC this morning improved, 15.0. Hemoglobin trending down, some 7.0. Platelets trending down 62. Hepatitis panel was negative. HSV IgG elevated, IgM within normal limits, suggestive of previous infection. Urinalysis red, large hemoglobin on urine dipstick. Urine drug screen positive for benzodiazepines and marijuana. Blood cultures no growth today. Imaging: CT abdomen and pelvis showed diffuse fatty infiltration of the liver. No acute findings per radiology. Pancreas was described as no focal abnormality.  Chest x-ray independently reviewed, hyperinflation. No acute disease.   Review of Systems:  Negative for sore throat, rash,   Positive for subjective fever, blurred vision, generalized muscle aches, shortness of breath, dysuria, ongoing abdominal pain and nausea  Past Medical History:  Diagnosis Date  . ETOH abuse   . Hypertension     Past Surgical History:  Procedure Laterality Date  . none       reports that he has been smoking Cigarettes.  He has been smoking about 1.00 pack per day. He has never used smokeless tobacco. He reports that he drinks about 3.0 oz of alcohol per week . He reports that he uses drugs, including Marijuana. Mobility: walks  No Known Allergies  Family History  Problem Relation Age of Onset  . Cirrhosis Mother      alcoholic  . Cirrhosis Father     alcoholic      Prior to Admission medications   Medication Sig Start Date End Date Taking? Authorizing Provider  bismuth subsalicylate (PEPTO BISMOL) 262 MG/15ML suspension Take 30 mLs by mouth every 6 (six) hours as needed for indigestion.   Yes Historical Provider, MD  ibuprofen (ADVIL,MOTRIN) 200 MG tablet Take 200 mg by mouth every 6 (six) hours as needed for headache or moderate pain.   Yes Historical Provider, MD  lansoprazole (PREVACID) 30 MG capsule Take 1 capsule (30 mg total) by mouth daily at 12 noon. Patient taking differently: Take 30 mg by mouth daily as needed (heartburn).  07/25/14  Yes Julianne Rice, MD  Tetrahydrozoline HCl (EYE DROPS OP) Place 2 drops into both eyes as needed (for red eyes).   Yes Historical Provider, MD    Physical Exam: Vitals:   10/03/16 0330 10/03/16 0500 10/03/16 0552 10/03/16 0845  BP: 107/75 117/88 100/71 119/91  Pulse: 108 90 98 103  Resp: 22 19 20 14   Temp:    98.3 F (36.8 C)  TempSrc:    Oral  SpO2: 91% 98% 96% 96%  Weight:      Height:        Constitutional:  . Appears calm and comfortable, Nontoxic Eyes:  . Pupils and irises appear normal . Icteric sclera, normal lids ENMT:  . grossly normal hearing . Lips appear normal Neck:  . neck appears normal, no masses, normal ROM, supple . no thyromegaly Respiratory:  . CTA bilaterally, no w/r/r.  . Respiratory effort normal. No retractions or accessory muscle use Cardiovascular:  . RRR, no m/r/g . No LE extremity edema   Abdomen:  . Abdomen appears normal; soft, nondistended, no pain with very gentle palpation, no rebound. Epigastric and generalized pain with moderate to deep palpation. . No hernias Musculoskeletal:  . RUE, LUE, RLE, LLE   o strength and tone grossly normal, no atrophy, no abnormal movements o No tenderness, masses Skin:  . No rashes, lesions, ulcers . palpation of skin: no induration or nodules Psychiatric:   . judgement and insight appear normal . Mental status o Mood, affect appropriate  Wt Readings from Last 3 Encounters:  10/02/16 59 kg (130 lb)  09/30/16 59 kg (130 lb)  07/02/16 63.5 kg (140 lb)    I have personally reviewed following labs and imaging studies  Labs on Admission:  CBC:  Recent Labs Lab 09/30/16 0556 10/01/16 0542 10/02/16 1204 10/03/16 0910  WBC 6.3 9.4 19.9* 15.0*  NEUTROABS  --   --  17.1* 13.0*  HGB 13.7 12.9* 10.2* 7.0*  HCT 37.7* 37.1* 30.5* 20.9*  MCV 89.8 92.3 99.7 100.0  PLT 218 168 83* 62*   Basic Metabolic Panel:  Recent Labs Lab 09/30/16 0556 10/01/16 0542 10/02/16 1204 10/03/16 0910  NA 135 133* 134* 137  K 4.5 5.2* 4.4 3.1*  CL 97* 97* 96* 100*  CO2 26 25 26 26   GLUCOSE 158* 136* 107* 108*  BUN 6 <5* 21* 16  CREATININE 1.05 0.71 <0.30* <0.30*  CALCIUM 9.0 8.7* 9.3 8.7*   Liver Function Tests:  Recent Labs Lab 09/30/16 0556 10/01/16 0542 10/02/16 1204 10/03/16 0910  AST 1,657* 795* 492* 394*  ALT 728* 486* 338* 219*  ALKPHOS 124 107 139* 128*  BILITOT 3.9* 11.7* 28.9* 32.7*  PROT 6.6 5.6* 6.2* 5.0*  ALBUMIN  3.6 3.1* 3.4* 2.8*    Recent Labs Lab 09/30/16 0556  LIPASE 30   Coagulation Profile:  Recent Labs Lab 09/30/16 0849 10/02/16 1204 10/03/16 0910  INR 1.43 1.15 1.14   Cardiac Enzymes:  Recent Labs Lab 10/02/16 1204  CKTOTAL 219   Urine analysis:    Component Value Date/Time   COLORURINE RED (A) 10/02/2016 1241   APPEARANCEUR CLOUDY (A) 10/02/2016 1241   LABSPEC 1.015 10/02/2016 1241   PHURINE 5.0 10/02/2016 1241   GLUCOSEU NEGATIVE 10/02/2016 1241   HGBUR LARGE (A) 10/02/2016 1241   BILIRUBINUR SMALL (A) 10/02/2016 1241   KETONESUR NEGATIVE 10/02/2016 1241   PROTEINUR 100 (A) 10/02/2016 1241   UROBILINOGEN 0.2 07/07/2014 0755   NITRITE NEGATIVE 10/02/2016 1241   LEUKOCYTESUR NEGATIVE 10/02/2016 1241    Radiological Exams on Admission: Dg Chest 2 View  Result Date:  10/02/2016 CLINICAL DATA:  Cough. EXAM: CHEST  2 VIEW COMPARISON:  09/30/2016. FINDINGS: Mediastinum and hilar structures are normal. Lungs are clear. No pleural effusion or pneumothorax. Heart size normal. No acute bony abnormality . IMPRESSION: No acute cardiopulmonary disease. Electronically Signed   By: Marcello Moores  Register   On: 10/02/2016 12:23   Ct Abdomen Pelvis W Contrast  Result Date: 10/02/2016 CLINICAL DATA:  Pelvic and low back pain for 5 days. EXAM: CT ABDOMEN AND PELVIS WITH CONTRAST TECHNIQUE: Multidetector CT imaging of the abdomen and pelvis was performed using the standard protocol following bolus administration of intravenous contrast. CONTRAST:  175mL ISOVUE-300 IOPAMIDOL (ISOVUE-300) INJECTION 61% COMPARISON:  09/30/2016 FINDINGS: Lower chest: Lung bases are clear. No effusions. Heart is normal size. Hepatobiliary: Diffuse fatty infiltration of the liver. No focal hepatic abnormality or biliary ductal dilatation. Gallbladder unremarkable. Pancreas: No focal abnormality or ductal dilatation. Spleen: No focal abnormality.  Normal size. Adrenals/Urinary Tract: Small low-density area in the midpole of the left kidney, likely small cysts. No hydronephrosis, renal for renal stones. Adrenal glands and urinary bladder are unremarkable. Stomach/Bowel: Appendix is normal. Stomach, large and small bowel grossly unremarkable. Previously seen apparent wall thickening in the transverse colon no longer visualized. Vascular/Lymphatic: Dense aortic and iliac calcifications. No aneurysm or adenopathy. Reproductive: No visible focal abnormality. Other: No free fluid or free air. Musculoskeletal: No acute bony abnormality or focal bone lesion. IMPRESSION: Diffuse fatty infiltration of the liver. No acute findings. Electronically Signed   By: Rolm Baptise M.D.   On: 10/02/2016 14:29    EKG: Independently reviewed: 12/20 1503: Sinus rhythm, LVH, prolonged QT. Same read for 1147. No significant change compared  to last study 07/02/2016  Principal Problem:   Hepatitis Active Problems:   Abnormal LFTs   Acute upper GI bleed   Anemia due to blood loss, acute   Hyperbilirubinemia   Elevated troponin   Assessment/Plan 50 year old man PMH alcoholism, with a complex constellation of signs and symptoms without obvious unifying diagnosis, to include markedly elevated total bilirubin, improving LFTs otherwise, GI bleed, progressive thrombocytopenia, elevated lactic acid and troponin. The last 2 which are pending on recheck this morning.   1. Acute hepatic injury with mixed elevation of LFTs (transaminases and total bilirubin), thrombocytopenia. o evidence of obstructive process on CT scan.  Acute viral hepatitis panel negative. Tylenol level negative. Per GI, felt not to be secondary purely from alcohol. Further management per GI. There is no evidence of ischemia or hemodynamic instability at this time.  Bilirubin continues to trend upwards although may be reaching peak.   Check HIV  Further management per GI. 2.  GIB, Normocytic anemia  Trending downward 13 >> 7 over 3 days). Asymptomatic.   Trend hemoglobin. Type and cross 4 units. May need transfusion. (ok with patient)  Further workup per GI 3. Elevated lactic acid of unclear etiology. Repeat level pending this morning. Again hemodynamics are stable in his clinical picture does not suggest sepsis at this point. For now we'll continue antibiotics, low threshold to discontinue if culture data negative in the next 1-2 days.  Recheck lactate this AM 4. Elevated troponin, repeat level pending this morning, EKG was nonacute, he describes atypical chest pain which has been present for 6 days now without change. His clinical picture does not suggest ACS or NSTEMI. Demand ischemia secondary to anemia, +/-cardiomyopathy such as alcohol-induced and myocarditis would be considerations  Trend troponin, check echo 5. Hypokalemia   Replete. 6. Leukocytosis.  Trending down. Etiology unclear, clinical picture does not strongly suggest infection. Continue to follow. 7. Thrombocytopenia   Etiology unclear. Normal on previous admission. Differential would include alcohol-induced, sepsis but would favor secondary to GI bleed at this point.  Trend CBC. SCDs. No heparin. 8. Gross hematuria  Etiology unclear. Certainly not the cause of his declining hemoglobin. He'll need further evaluation, likely as an outpatient at this point. 9. Diffuse abdominal pain acute on chronic. CT scan no acute abnormalities. Lipase was within normal limits 12/25. Abdominal exam benign. Do not suspect acute abdominal process at this time. 10. Alcohol abuse since age 33. 11. Aortic atherosclerosis.   Despite these abnormalities, hemodynamics have been stable for the last 24 hours, he is afebrile and his clinical picture is not strongly suggestive of sepsis.   However, he is at high risk for deterioration given his multiple abnormalities and requires a high level of care.  ADDENDUM 1830 Resting comfortably, receiving first unit PRBC, vitals stable, no gross bleeding per RN Troponin trending down. Lactic acid down and now up. No evidence of sepsis. Likely related to other acute issues. Will follow clinically.  It is my clinical opinion that admission to INPATIENT is reasonable and necessary in this patient  Given the aforementioned, the predictability of an adverse outcome is high. I expect that the patient will require at least 2 midnights in the hospital to treat this condition.   DVT prophylaxis:SCDs Code Status: full code Family Communication: none Consults called: GI already seeing    Time spent: 70 minutes  Murray Hodgkins, MD  [facility] Direct contact: [phone] --[place] [place]  --[email or web address]; password [email or web address]  7PM-7AM contact night coverage as above  10/03/2016, 11:45 AM

## 2016-10-03 NOTE — ED Notes (Signed)
ADMISSION MD AWARE OF PT'S PAIN LEVEL

## 2016-10-03 NOTE — ED Notes (Signed)
Primary RN made aware of patient's critical lab value. 

## 2016-10-03 NOTE — Progress Notes (Signed)
[facility] Progress Note  Chief Complaint:   Abnormal liver labs, vomiting blood and rectal bleeding  Subjective: vomiting dark blood this am. Having loose stool with some bright red blood.   Objective:  Vital signs in last 24 hours: Temp:  [97.6 F (36.4 C)-98.3 F (36.8 C)] 98.3 F (36.8 C) (12/28 0845) Pulse Rate:  [83-108] 103 (12/28 0845) Resp:  [13-24] 14 (12/28 0845) BP: (90-140)/(71-103) 119/91 (12/28 0845) SpO2:  [90 %-98 %] 96 % (12/28 0845)   General:   Alert, thin black male  in NAD EENT:  Normal hearing, non icteric sclera, conjunctive pink.  Heart:  Sinus tachycardia , HR 106. Regular rhythm;, no lower extremity edema Pulm: Normal respiratory effort, lungs CTA bilaterally without wheezes or crackles. Abdomen:  Soft, nondistended, diffusely tender.  Normal bowel sounds, no masses felt. No hepatomegaly.    Neurologic:  Alert and oriented to person , place and time but speech slightly slurred. No asterixis.  Psych:  Alert and cooperative. Normal mood and affect.  Intake/Output from previous day: No intake/output data recorded. Intake/Output this shift: No intake/output data recorded.  Lab Results:  Recent Labs  10/01/16 0542 10/02/16 1204 10/03/16 0910  WBC 9.4 19.9* 15.0*  HGB 12.9* 10.2* 7.0*  HCT 37.1* 30.5* 20.9*  PLT 168 83* 62*   BMET  Recent Labs  10/01/16 0542 10/02/16 1204 10/03/16 0910  NA 133* 134* 137  K 5.2* 4.4 3.1*  CL 97* 96* 100*  CO2 25 26 26   GLUCOSE 136* 107* 108*  BUN <5* 21* 16  CREATININE 0.71 <0.30* <0.30*  CALCIUM 8.7* 9.3 8.7*   LFT  Recent Labs  10/03/16 0910  PROT 5.0*  ALBUMIN 2.8*  AST 394*  ALT 219*  ALKPHOS 128*  BILITOT 32.7*   PT/INR  Recent Labs  10/02/16 1204 10/03/16 0910  LABPROT 14.8 14.7  INR 1.15 1.14   Hepatitis Panel  Recent Labs  09/30/16 1102 09/30/16 1351  HEPBSAG Negative  --   HCVAB <0.1 <0.1  HEPAIGM Negative  --   HEPBIGM  --  Negative    Dg  Chest 2 View  Result Date: 10/02/2016 CLINICAL DATA:  Cough. EXAM: CHEST  2 VIEW COMPARISON:  09/30/2016. FINDINGS: Mediastinum and hilar structures are normal. Lungs are clear. No pleural effusion or pneumothorax. Heart size normal. No acute bony abnormality . IMPRESSION: No acute cardiopulmonary disease. Electronically Signed   By: Marcello Moores  Register   On: 10/02/2016 12:23   Ct Abdomen Pelvis W Contrast  Result Date: 10/02/2016 CLINICAL DATA:  Pelvic and low back pain for 5 days. EXAM: CT ABDOMEN AND PELVIS WITH CONTRAST TECHNIQUE: Multidetector CT imaging of the abdomen and pelvis was performed using the standard protocol following bolus administration of intravenous contrast. CONTRAST:  171mL ISOVUE-300 IOPAMIDOL (ISOVUE-300) INJECTION 61% COMPARISON:  09/30/2016 FINDINGS: Lower chest: Lung bases are clear. No effusions. Heart is normal size. Hepatobiliary: Diffuse fatty infiltration of the liver. No focal hepatic abnormality or biliary ductal dilatation. Gallbladder unremarkable. Pancreas: No focal abnormality or ductal dilatation. Spleen: No focal abnormality.  Normal size. Adrenals/Urinary Tract: Small low-density area in the midpole of the left kidney, likely small cysts. No hydronephrosis, renal for renal stones. Adrenal glands and urinary bladder are unremarkable. Stomach/Bowel: Appendix is normal. Stomach, large and small bowel grossly unremarkable. Previously seen apparent wall thickening in the transverse colon no longer visualized. Vascular/Lymphatic: Dense aortic and iliac calcifications. No aneurysm or adenopathy. Reproductive: No visible focal abnormality. Other: No free fluid  or free air. Musculoskeletal: No acute bony abnormality or focal bone lesion. IMPRESSION: Diffuse fatty infiltration of the liver. No acute findings. Electronically Signed   By: Rolm Baptise M.D.   On: 10/02/2016 14:29    Assessment / Plan:  1. Abnormal LFTs, mixed pattern. Transaminases continue to improve but  Tbili now up to 32. Acute hepatitis panel negative. HSV IgM negative.  He is sluggish today, speech slurred. No asterixis. INR normal. Glucose normal. Mental status changes secondary to sepsis?  -Continue lactulose, am labs.   2. ? Sepsis with elevated lactic acid of 7.16 yesterday, elevated trop and leukocytosis. Got 3 liters IVF.Getting Vanc and Zosyn for ? Sepsis. WBC improved from 20 to 15K. Blood cultures pending.  -rechecking lactic acid and troponin now.   3. Hematemesis. States he has been up to bathroom with bloody emesis this am. Describes loose stool ( lactulose) with bright red blood. His BUN is normal. Hgb has dropped several grams (12.9 -> 10.2 -> 7.0). Some of the drop in hgb may be dilutional after 3 liters of IVF yesterday.   -continue IV PPI -starting IVF at 126ml/ hr -Q 6 CBC, transfuse prn -Eventual EGD  4. Rectal bleeding. He does have hemorrhoids. Started steroid supp yesterday. Colonoscopy at some point (outpatient)  There was a delay in admission as patient is teaching service patient and was supposed to transfer to [facility] but no beds available. Admission orders pending. In the interim I am starting IVF, rechecking troponin, lactic acid level and placing SCDs.     LOS: 1 day   Tye Savoy NP  10/03/2016, 10:45 AM  Pager number [phone]   I have discussed the case with the PA, and that is the plan I formulated. I personally interviewed and examined the patient.  He has an acute liver injury that appears to be multifactorial from alcohol and some other medicine or toxic ingestion that I do not think we will be able to know for certain. His transaminases are improving, and I suspect that his bilirubin will peak soon. Fortunately, his synthetic function remains unaffected with a normal INR. Nevertheless, he appears to be clinically encephalopathic with altered mental status and sluggish speech pattern over the last 24 hours. That is why started him on lactulose last  night.  He seems to have evidence of systemic inflammatory response, but without any clear infectious source. It appears that that is probably due to this acute liver process. He has a markedly drop in hemoglobin but no overt GI bleeding at present. He just had a large bowel movement which  was loose and green as if from lactulose. It was not melena or maroon blood. Thus, his significant drop in hemoglobin and thrombus cytopenia do not appear to be from overt GI bleeding. Also seems to be from the systemic inflammatory process.  At this point, it appears that he needs supportive care, and I will also allow him to eat.  He will be receiving a PRBC transfusion tonight, and we will follow him closely with you. It appears appropriate to keep him in a monitored setting.    Nelida Meuse III Pager [phone]  Mon-Fri 8a-5p [phone] after 5p, weekends, holidays

## 2016-10-03 NOTE — Progress Notes (Signed)
Pharmacy Antibiotic Note  ISILELI GATSON is a 50 y.o. male admitted on 10/02/2016 with sepsis.  Pharmacy has been consulted for Vanc/Zosyn dosing.  Plan: Vancomycin 1g x 1 then 500mg  IV every 8 hours.  Goal trough 15-20 mcg/mL. Zosyn 3.375g IV q8h (4 hour infusion).  Height: 5\' 9"  (175.3 cm) Weight: 130 lb (59 kg) IBW/kg (Calculated) : 70.7  Temp (24hrs), Avg:98 F (36.7 C), Min:97.6 F (36.4 C), Max:98.3 F (36.8 C)   Recent Labs Lab 09/30/16 0556 10/01/16 0542 10/02/16 1204 10/02/16 1210 10/02/16 1450 10/03/16 0910  WBC 6.3 9.4 19.9*  --   --  15.0*  CREATININE 1.05 0.71 <0.30*  --   --  <0.30*  LATICACIDVEN  --   --   --  4.45* 7.16*  --     CrCl cannot be calculated (This lab value cannot be used to calculate CrCl because it is not a number: <0.30).    No Known Allergies   Thank you for allowing pharmacy to be a part of this patient's care.   Adrian Saran, PharmD, BCPS Pager [phone] 10/03/2016 11:49 AM

## 2016-10-03 NOTE — Progress Notes (Addendum)
I received a call from Dr. Laverta Baltimore about orders for this patient. Unfortunately, no member from our team has been able to evaluate this patient. I spoke with Dr. Laverta Baltimore and expressed my concerns about the acuity of this patient's illness and my inability to get in touch with bed placement to assess wait time for a stepdown bed. In the interest of this patient's care, I recommended that we ask the hospitalist service to graciously admit this patient if a bed is available, and he agreed to check and call back.  ADDENDUM 10/03/2016  9:18 AM:  Spoke with patient placement. No stepdown beds are available at this time at [facility]. I spoke with Dr. Laverta Baltimore who told me that the hospitalist has not yet returned their call. He will try paging them again.  In the interim, I will order labwork to reassess his condition.   ADDENDUM 10/03/2016  10:04 AM:  I received a page from Dr. Laverta Baltimore stating that [facility] will graciously admit this patient. We appreciate their coordination in care.

## 2016-10-04 ENCOUNTER — Inpatient Hospital Stay (HOSPITAL_COMMUNITY): Payer: BLUE CROSS/BLUE SHIELD

## 2016-10-04 DIAGNOSIS — F1010 Alcohol abuse, uncomplicated: Secondary | ICD-10-CM

## 2016-10-04 DIAGNOSIS — R7889 Finding of other specified substances, not normally found in blood: Secondary | ICD-10-CM

## 2016-10-04 DIAGNOSIS — D696 Thrombocytopenia, unspecified: Secondary | ICD-10-CM

## 2016-10-04 LAB — CBC
HCT: 24.6 % — ABNORMAL LOW (ref 39.0–52.0)
HEMATOCRIT: 26.8 % — AB (ref 39.0–52.0)
HEMATOCRIT: 22.8 % — AB (ref 39.0–52.0)
HEMOGLOBIN: 9.1 g/dL — AB (ref 13.0–17.0)
HEMOGLOBIN: 9.9 g/dL — AB (ref 13.0–17.0)
Hemoglobin: 8.4 g/dL — ABNORMAL LOW (ref 13.0–17.0)
MCH: 33.3 pg (ref 26.0–34.0)
MCH: 33.2 pg (ref 26.0–34.0)
MCH: 32.7 pg (ref 26.0–34.0)
MCHC: 37 g/dL — ABNORMAL HIGH (ref 30.0–36.0)
MCHC: 36.8 g/dL — ABNORMAL HIGH (ref 30.0–36.0)
MCHC: 36.9 g/dL — AB (ref 30.0–36.0)
MCV: 90.1 fL (ref 78.0–100.0)
MCV: 89.9 fL (ref 78.0–100.0)
MCV: 88.7 fL (ref 78.0–100.0)
PLATELETS: 44 10*3/uL — AB (ref 150–400)
Platelets: 51 10*3/uL — ABNORMAL LOW (ref 150–400)
Platelets: 46 10*3/uL — ABNORMAL LOW (ref 150–400)
RBC: 2.57 MIL/uL — AB (ref 4.22–5.81)
RBC: 2.73 MIL/uL — AB (ref 4.22–5.81)
RBC: 2.98 MIL/uL — ABNORMAL LOW (ref 4.22–5.81)
RDW: 16.4 % — ABNORMAL HIGH (ref 11.5–15.5)
RDW: 17.5 % — ABNORMAL HIGH (ref 11.5–15.5)
RDW: 17.1 % — AB (ref 11.5–15.5)
WBC: 12.2 10*3/uL — AB (ref 4.0–10.5)
WBC: 13.2 10*3/uL — AB (ref 4.0–10.5)
WBC: 13.7 10*3/uL — AB (ref 4.0–10.5)

## 2016-10-04 LAB — COMPREHENSIVE METABOLIC PANEL
ALBUMIN: 2.7 g/dL — AB (ref 3.5–5.0)
ALK PHOS: 102 U/L (ref 38–126)
ALK PHOS: 93 U/L (ref 38–126)
ALT: 180 U/L — ABNORMAL HIGH (ref 17–63)
ALT: 149 U/L — AB (ref 17–63)
AST: 329 U/L — AB (ref 15–41)
AST: 258 U/L — AB (ref 15–41)
Albumin: 2.4 g/dL — ABNORMAL LOW (ref 3.5–5.0)
Anion gap: 9 (ref 5–15)
Anion gap: 10 (ref 5–15)
BILIRUBIN TOTAL: 37.1 mg/dL — AB (ref 0.3–1.2)
BUN: 14 mg/dL (ref 6–20)
BUN: 10 mg/dL (ref 6–20)
CALCIUM: 8.3 mg/dL — AB (ref 8.9–10.3)
CHLORIDE: 96 mmol/L — AB (ref 101–111)
CO2: 26 mmol/L (ref 22–32)
CO2: 24 mmol/L (ref 22–32)
Calcium: 9.1 mg/dL (ref 8.9–10.3)
Chloride: 102 mmol/L (ref 101–111)
Creatinine, Ser: <0.3 mg/dL — ABNORMAL LOW (ref 0.61–1.24)
GLUCOSE: 90 mg/dL (ref 65–99)
GLUCOSE: 100 mg/dL — AB (ref 65–99)
POTASSIUM: 3.1 mmol/L — AB (ref 3.5–5.1)
Potassium: 3 mmol/L — ABNORMAL LOW (ref 3.5–5.1)
SODIUM: 137 mmol/L (ref 135–145)
SODIUM: 130 mmol/L — AB (ref 135–145)
Total Bilirubin: 31.6 mg/dL (ref 0.3–1.2)
Total Protein: 5.4 g/dL — ABNORMAL LOW (ref 6.5–8.1)
Total Protein: 5.3 g/dL — ABNORMAL LOW (ref 6.5–8.1)

## 2016-10-04 LAB — ECHOCARDIOGRAM COMPLETE
Height: 68 in
Weight: 1777.79 oz

## 2016-10-04 LAB — MAGNESIUM: Magnesium: 1.6 mg/dL — ABNORMAL LOW (ref 1.7–2.4)

## 2016-10-04 LAB — RETICULOCYTES
RBC.: 2.98 MIL/uL — ABNORMAL LOW (ref 4.22–5.81)
RETIC COUNT ABSOLUTE: 137.1 10*3/uL (ref 19.0–186.0)
Retic Ct Pct: 4.6 % — ABNORMAL HIGH (ref 0.4–3.1)

## 2016-10-04 LAB — PROTIME-INR
INR: 1.21
Prothrombin Time: 15.3 seconds — ABNORMAL HIGH (ref 11.4–15.2)

## 2016-10-04 LAB — IRON AND TIBC
IRON: 213 ug/dL — AB (ref 45–182)
SATURATION RATIOS: 122 % — AB (ref 17.9–39.5)
TIBC: 175 ug/dL — ABNORMAL LOW (ref 250–450)

## 2016-10-04 LAB — FOLATE: FOLATE: 9.8 ng/mL (ref >5.9)

## 2016-10-04 LAB — BILIRUBIN, FRACTIONATED(TOT/DIR/INDIR)
BILIRUBIN INDIRECT: 10.3 mg/dL — AB (ref 0.3–0.9)
Bilirubin, Direct: 20.1 mg/dL — ABNORMAL HIGH (ref 0.1–0.5)
Total Bilirubin: 30.4 mg/dL (ref 0.3–1.2)

## 2016-10-04 LAB — LIPASE, BLOOD: LIPASE: 210 U/L — AB (ref 11–51)

## 2016-10-04 LAB — APTT: APTT: 32 s (ref 24–36)

## 2016-10-04 LAB — TROPONIN I
TROPONIN I: 0.19 ng/mL — AB (ref <0.03)
TROPONIN I: 0.21 ng/mL — AB (ref <0.03)

## 2016-10-04 LAB — VITAMIN B12: Vitamin B-12: 1360 pg/mL — ABNORMAL HIGH (ref 180–914)

## 2016-10-04 LAB — FERRITIN

## 2016-10-04 LAB — AMMONIA: Ammonia: 49 umol/L — ABNORMAL HIGH (ref 9–35)

## 2016-10-04 LAB — PHOSPHORUS: Phosphorus: 2.5 mg/dL (ref 2.5–4.6)

## 2016-10-04 LAB — HIV ANTIBODY (ROUTINE TESTING W REFLEX): HIV Screen 4th Generation wRfx: NONREACTIVE

## 2016-10-04 MED ORDER — ADULT MULTIVITAMIN W/MINERALS CH
1.0000 | ORAL_TABLET | Freq: Every day | ORAL | Status: DC
  Administered 2016-10-04 – 2016-10-06 (×3): 1 via ORAL
  Filled 2016-10-04 (×3): qty 1

## 2016-10-04 MED ORDER — POTASSIUM CHLORIDE 10 MEQ/100ML IV SOLN
10.0000 meq | INTRAVENOUS | Status: AC
  Administered 2016-10-04 (×3): 10 meq via INTRAVENOUS
  Filled 2016-10-04 (×4): qty 100

## 2016-10-04 MED ORDER — PANTOPRAZOLE SODIUM 40 MG PO TBEC
40.0000 mg | DELAYED_RELEASE_TABLET | Freq: Two times a day (BID) | ORAL | Status: DC
  Administered 2016-10-04 – 2016-10-05 (×2): 40 mg via ORAL
  Filled 2016-10-04 (×2): qty 1

## 2016-10-04 MED ORDER — SODIUM CHLORIDE 0.9 % IV SOLN
30.0000 meq | Freq: Once | INTRAVENOUS | Status: DC
  Filled 2016-10-04: qty 15

## 2016-10-04 MED ORDER — ENSURE ENLIVE PO LIQD
237.0000 mL | Freq: Two times a day (BID) | ORAL | Status: DC
  Administered 2016-10-05 – 2016-10-06 (×3): 237 mL via ORAL

## 2016-10-04 NOTE — Progress Notes (Signed)
Initial Nutrition Assessment  DOCUMENTATION CODES:   Severe malnutrition in context of social or environmental circumstances, Underweight  INTERVENTION:  Monitor magnesium, potassium, and phosphorus daily for at least 3 days, MD to replete as needed, as pt is at risk for refeeding syndrome given severe malnutrition, 21% weight loss in 3 months.  Provide Ensure Enlive po BID, each supplement provides 350 kcal and 20 grams of protein.  Provide multivitamin with minerals daily.  Encouraged adequate intake of calories and protein at meals to prevent further weight loss and help with patient's goals to gain muscle/weight. Reviewed foods/drinks patient can have at home if he cannot get Ensure.  NUTRITION DIAGNOSIS:   Malnutrition (Severe) related to social / environmental circumstances as evidenced by 21 percent weight loss over 3 months, severe depletion of body fat, severe depletion of muscle mass.  GOAL:   Patient will meet greater than or equal to 90% of their needs  MONITOR:   PO intake, Supplement acceptance, Labs, Weight trends, I & O's  REASON FOR ASSESSMENT:   Malnutrition Screening Tool    ASSESSMENT:   50 year old man PMH alcoholism, presented initially Christmas day with abdominal pain, nausea or vomiting. Admitted at [facility], subsequently left [facility] 12/26. PresentedDecember 27 to the emergency department at [facility]. The patient reports symptoms began 12/23 with diffuse abdominal pain, vomiting of blood, hematuria, blood per rectum and precordial chest pain. Found to have acute hepatic injury.   Spoke with patient at bedside. He reports his appetite is good today and he had 100% of a big breakfast. His appetite was poor PTA since 12/22. However, patient reports at best he may eat one meal per day in setting of his alcoholism as he spends the rest of the day drinking or at work. He reports he is trying to get into an AA class with the help of one of his MDs and he hopes  to start eating better then so he can gain his weight back. Patient endorses some diarrhea in setting of laxatives. Denies N/V or abdominal pain at this time.   Patient reports UBW 125 lbs. Per chart, patient was 140 lbs on 9/26 and has lost 29 lbs (21% body weight) over 3 months, which is significant for time frame.  Meal Completion: 100% of "big breakfast" this morning per patient report  Medications reviewed and include: lactulose 20 grams TID, pantoprazole, Zosyn, potassium chloride 10 mEq 3 times today, vancomycin, NS @ 150 ml/hr.  Labs reviewed: Creatinine <0.3, Lipase 210, AST 329, ALT 180, Potassium 3.1, Ammonia 49, T Bili 37.1, elevated Troponin.   Nutrition-Focused physical exam completed. Findings are severe fat depletion, severe muscle depletion, and no edema. Noticed presence of jaundice seen in patient's eyes.  Patient meets criteria for severe malnutrition in the context of social or environmental circumstances in setting of 21% weight loss over 3 months, severe fat depletion, severe muscle depletion.   Discussed with RN.   Diet Order:  Diet regular Room service appropriate? Yes; Fluid consistency: Thin  Skin:  Reviewed, no issues  Last BM:  10/04/2016  Height:   Ht Readings from Last 1 Encounters:  10/03/16 5\' 8"  (1.727 m)    Weight:   Wt Readings from Last 1 Encounters:  10/03/16 111 lb 1.8 oz (50.4 kg)    Ideal Body Weight:  70 kg  BMI:  Body mass index is 16.89 kg/m.  Estimated Nutritional Needs:   Kcal:  [id number] (MSJ x 1.2-1.4)  Protein:  75-85 grams (1.5-1.7  grams/kg)  Fluid:  >/= 1.5-1.7 L/day (30-35 ml/kg)  EDUCATION NEEDS:   Education needs addressed  Willey Blade, MS, RD, LDN Pager: [phone] After Hours Pager: [phone]

## 2016-10-04 NOTE — Progress Notes (Signed)
[facility] Progress Note  Chief Complaint:   GI bleeding and abnormal LFTs  Subjective: Feels "great" today. Having non-blood, solid BMs. Ate a good breakfast. No vomiting.   Objective:  Vital signs in last 24 hours: Temp:  [97.7 F (36.5 C)-99.5 F (37.5 C)] 97.7 F (36.5 C) (12/29 0400) Pulse Rate:  [84-135] 84 (12/28 2035) Resp:  [13-22] 21 (12/29 0600) BP: (109-156)/(83-99) 132/98 (12/29 0600) SpO2:  [81 %-100 %] 100 % (12/29 0600) Weight:  [111 lb 1.8 oz (50.4 kg)] 111 lb 1.8 oz (50.4 kg) (12/28 1438) Last BM Date: 10/03/16 General:   Alert, thin black male in NAD EENT:  Icteric sclera Heart:  Regular rate and rhythm; no lower extremity edema Pulm: Normal respiratory effort, lungs CTA bilaterally without wheezes or crackles. Abdomen:  Soft, nondistended, nontender.  Normal bowel sounds, no masses felt. No hepatomegaly.    Neurologic:  Alert and  oriented x4;  grossly normal neurologically. Psych:  Alert and cooperative. Normal mood and affect.   Intake/Output from previous day: 12/28 0701 - 12/29 0700 In: 4025 [P.O.:150; I.V.:2755; Blood:670; IV Piggyback:450] Out: -  Intake/Output this shift: No intake/output data recorded.  Lab Results:  Recent Labs  10/03/16 1230 10/04/16 0049 10/04/16 0420  WBC 15.2* 13.7* 13.2*  HGB 7.1* 9.1* 9.9*  HCT 20.5* 24.6* 26.8*  PLT 52* 44* 51*   BMET  Recent Labs  10/02/16 1204 10/03/16 0910 10/04/16 0420  NA 134* 137 137  K 4.4 3.1* 3.1*  CL 96* 100* 102  CO2 26 26 26   GLUCOSE 107* 108* 90  BUN 21* 16 14  CREATININE <0.30* <0.30* <0.30*  CALCIUM 9.3 8.7* 9.1   LFT  Recent Labs  10/04/16 0420  PROT 5.4*  ALBUMIN 2.7*  AST 329*  ALT 180*  ALKPHOS 102  BILITOT 37.1*   PT/INR  Recent Labs  10/03/16 0910 10/04/16 0420  LABPROT 14.7 15.3*  INR 1.14 1.21    Assessment / Plan:  1. 50 yo male with abnormal LFTs. Felt to be combination of ETOH and some other unknown insult.  Transaminases continue to improve, bili still rising and up to 37 today. Mental status much better than when I saw him yesterday afternoon. His INR is slightly higher but still in normal range (1.14---> 1.21). -continue lactulose -am CMET, CBC, INR  2. Leukocytosis, elevated lactic acid and troponin, thrombocytopenia. No evidence for infection. Abd / pelvis CTscan negative. CXR negative. U/A not suspicious. Blood cultures pending.  -WBC slowly improving (15.2 -> 13.7 -> 13.2). Still on Zosyn and Vanco (empirically) -platelet improved this am (44 --> 51)  3. Acute normocytic anemia - exact cause not clear. He reported some hematemesis and hematochezia this admission but no evidence for significant bleeding found. Stools black with pepto. No blood in vault on DRE. He does have large hemorrhoids which are being treated now. Probably had some degree of GI bleeding but also wonder about hemolysis in setting of acute illness. Baseline hgb 13.7, down to 7.0. He received two units of blood last night, hgb bumped appropriately to 9.9.   4. Hematemesis and hematochezia (per patient) at home and during this admission. Both have resolved. No blood or melena on DRE on admissionHe was taken bismuth at home. He has large hemorrhoids -eventually will need colonoscopy (outpatient). Possibly outpatient EGD given his report of hematemesis. The GEJ wall thickening on intial CTscan was not mentioned on most recent scan -continue bid ppi. Can change to PO now. -  continue steroid cream for hemorrhoids    LOS: 2 days   Tye Savoy  10/04/2016, 9:04 AM  Pager number [phone]

## 2016-10-04 NOTE — Progress Notes (Signed)
PROGRESS NOTE    Steven Li  EQA:[id number] DOB: 12-02-1965 DOA: 10/02/2016 PCP: No PCP Per Patient    Brief Narrative:  50 year old man PMH alcoholism, presented initially Christmas day with abdominal pain, nausea or vomiting. Admitted at [facility], subsequently left [facility] 12/26. Presented December 27 to the emergency department at [facility], was accepted to a stepdown bed at [facility] that day by internal medicine. However as of the time of this dictation, there is no stepdown or telemetry bed available at [facility]. Therefore the hospitalist service at [facility] was first contacted at [phone] 12/27 and request for admission made at that time.  Hospital course summarized below. The patient reports symptoms began 12/23 with diffuse abdominal pain, vomiting of blood, hematuria, blood per rectum and precordial chest pain. Since that time his symptoms have only worsened. No specific aggravating or alleviating factors. Associated symptoms include ongoing vomiting including this morning. Bowels are moving. He volunteers that he is alcoholic, last drink 92/11. Took 1 tablet of Tylenol. No significant NSAID use. Denies unusual medicine or food ingestion. Also smokes marijuana and cigarettes. Chest pain has been constant in nature, described as severe, unable to characterize.  He continues to have epigastric and generalized abdominal pain, precordial chest pain.  Chart review summary:  Admitted [facility] 12/25, left AMA 12/26; treated for mixed elevation of LFTs of unclear etiology (multifactorial per GI), colitis of transverse colon with Cipro/Flagyl, CIWA for known alcohol abuse.  ED Course: ED visit [facility] 12/27 for abd pain, vomiting. Found to have elevated lactic acid, elevated troponin, elevated LFTs. EDP d/w [facility] internal medicine who accepted to SDU at [facility] . However no beds are available.  During his time in the emergency department for the last 24 hours, afebrile, vital signs stable. Treated with  lactulose, morphine, metronidazole, Protonix, Zosyn, vancomycin. Initially refused lactulose, demanded pain medication. At the time of this dictation, vital signs remained stable. Pertinent labs: Potassium this morning 3.1, remainder basic metabolic panel unremarkable. Anion gap is normal. Transaminases are trending down, ALT 219, AST 394, alkaline phosphatase 128. However, total bilirubin trending upwards, 32.7. Initial troponin 0.67, next troponin 0.73, no troponins drawn in the last 16 hours. Initial lactic acid was 4.45, on recheck 7.16, no further data available at time of dictation. WBC this morning improved, 15.0. Hemoglobin trending down, some 7.0. Platelets trending down 62. Hepatitis panel was negative. HSV IgG elevated, IgM within normal limits, suggestive of previous infection. Urinalysis red, large hemoglobin on urine dipstick. Urine drug screen positive for benzodiazepines and marijuana. Blood cultures no growth today. Imaging: CT abdomen and pelvis showed diffuse fatty infiltration of the liver. No acute findings per radiology. Pancreas was described as no focal abnormality.  Chest x-ray independently reviewed, hyperinflation. No acute disease.   Assessment & Plan:   Principal Problem:   Hepatitis Active Problems:   Abnormal LFTs   Acute upper GI bleed   Anemia due to blood loss, acute   Hyperbilirubinemia   Elevated troponin   Anemia  Acute hepatic injury with mixed elevation of LFTs (transaminases and total bilirubin), thrombocytopenia w/o evidence of obstructive process on CT scan - Acute viral hepatitis panel negative - Tylenol level negative - Per GI, felt not to be secondary purely from alcohol - Further management per GI - No evidence of ischemia or hemodynamic instability at this time - Bilirubin continues to trend upwards - will order fractionated bilirubin  - PT minimally elevated- 15.3 (normal is up to 15.2) - PTT  WNL, INR WNL - Alk Phos/ Bili ration and AST/  ALT ratio not congruent with Wilson's disease - order peripheral smear - will order peripheral smear - Check HIV - Further management per GI - Pt remains afebrile, and vital signs mostly WNL  GIB, Normocytic anemia - Trending downward 13 >> 7 over 3 days). Asymptomatic.  - Trend hemoglobin. Type and cross 4 units. May need transfusion. (ok with patient) - Further workup per GI - s/p blood transfusion yesterday  Elevated lactic acid of unclear etiology - increased to 3.1 this am - Recheck lactate this afternoon  Elevated troponin - trending down - Echo pending - etiology possibly demand ischemia secondary to anemia, +/-cardiomyopathy such as alcohol-induced and myocarditis would be considerations  Hypokalemia  - IV potassium ordered this am - repeat CMP this afternoon  Leukocytosis - Trending down - Etiology unclear - No systemic signs suggesting infection - continue zosyn and vancomyin  Thrombocytopenia  - Etiology unclear - Normal on previous admission - Plts at 51 this am (previous value of 44 this am and 52 yesterday) - Trend CBC - no heparin, lovenox or blood thinners  Gross hematuria - Etiology unclear - understands will need outpatient f/u with urology  Diffuse abdominal pain acute on chronic - improved today - Lipase was within normal limits 12/25 - Abdominal exam benign  Alcohol abuse since age 67 - drinks a 12 pack of beer daily - last drink 1 week ago - no signs of withdrawal currently   Despite these abnormalities, hemodynamics have been stable for the last 24 hours, he is afebrile and his clinical picture is not strongly suggestive of sepsis.   However, he is at high risk for deterioration given his multiple abnormalities and requires a high level of care.   DVT prophylaxis:SCDs Code Status: full code Family Communication: none Disposition Plan: pending improvement and medical stability; patient states he will discharge back home when he is  cleared but understands this will likely not be for a few days   Consultants:   Gastroenterology  Procedures:   None  Antimicrobials:   Vancomycin  Zosyn    Subjective: Patient sitting up in bed watching television.  He says this morning he feels almost 100% better than he did yesterday.  No hematochezia since yesterday.  Mentions he would like a majority of his workup and tests to be done outpatient as he cannot stay in the hospital for more than a few days due to his job and having to pay his rent.  Reports he has had "yellow eyes" since he was 49 years old.  Denies any nausea this morning.    Objective: Vitals:   10/04/16 0200 10/04/16 0400 10/04/16 0404 10/04/16 0600  BP: (!) 156/97  130/85 (!) 132/98  Pulse:      Resp: 13  (!) 22 (!) 21  Temp:  97.7 F (36.5 C)    TempSrc:  Oral    SpO2: (!) 81%   100%  Weight:      Height:        Intake/Output Summary (Last 24 hours) at 10/04/16 0843 Last data filed at 10/04/16 8299  Gross per 24 hour  Intake             4025 ml  Output                0 ml  Net             4025 ml   [employer]  10/02/16 1022 10/02/16 1024 10/03/16 1438  Weight: 59 kg (130 lb) 59 kg (130 lb) 50.4 kg (111 lb 1.8 oz)    Examination:  General exam: Appears calm and comfortable, thin, cachectic male Respiratory system: Clear to auscultation. Respiratory effort normal. Cardiovascular system: S1 & S2 heard, RRR. No JVD, murmurs, rubs, gallops or clicks. No pedal edema. Gastrointestinal system: Abdomen is nondistended, soft and nontender. No organomegaly or masses felt. Normal bowel sounds heard. Central nervous system: Alert and oriented. No focal neurological deficits. Extremities: Symmetric 5 x 5 power. Skin: No rashes, lesions or ulcers Psychiatry: Unclear insight into severity of current medical status. Mood & affect appropriate.     Data Reviewed: I have personally reviewed following labs and imaging studies  CBC:  Recent  Labs Lab 10/02/16 1204 10/03/16 0910 10/03/16 1230 10/04/16 0049 10/04/16 0420  WBC 19.9* 15.0* 15.2* 13.7* 13.2*  NEUTROABS 17.1* 13.0*  --   --   --   HGB 10.2* 7.0* 7.1* 9.1* 9.9*  HCT 30.5* 20.9* 20.5* 24.6* 26.8*  MCV 99.7 100.0 99.5 90.1 89.9  PLT 83* 62* 52* 44* 51*   Basic Metabolic Panel:  Recent Labs Lab 09/30/16 0556 10/01/16 0542 10/02/16 1204 10/03/16 0910 10/04/16 0420  NA 135 133* 134* 137 137  K 4.5 5.2* 4.4 3.1* 3.1*  CL 97* 97* 96* 100* 102  CO2 26 25 26 26 26   GLUCOSE 158* 136* 107* 108* 90  BUN 6 <5* 21* 16 14  CREATININE 1.05 0.71 <0.30* <0.30* <0.30*  CALCIUM 9.0 8.7* 9.3 8.7* 9.1   GFR: CrCl cannot be calculated (This lab value cannot be used to calculate CrCl because it is not a number: <0.30). Liver Function Tests:  Recent Labs Lab 09/30/16 0556 10/01/16 0542 10/02/16 1204 10/03/16 0910 10/04/16 0420  AST 1,657* 795* 492* 394* 329*  ALT 728* 486* 338* 219* 180*  ALKPHOS 124 107 139* 128* 102  BILITOT 3.9* 11.7* 28.9* 32.7* 37.1*  PROT 6.6 5.6* 6.2* 5.0* 5.4*  ALBUMIN 3.6 3.1* 3.4* 2.8* 2.7*    Recent Labs Lab 09/30/16 0556 10/04/16 0420  LIPASE 30 210*    Recent Labs Lab 10/04/16 0420  AMMONIA 49*   Coagulation Profile:  Recent Labs Lab 09/30/16 0849 10/02/16 1204 10/03/16 0910 10/04/16 0420  INR 1.43 1.15 1.14 1.21   Cardiac Enzymes:  Recent Labs Lab 10/02/16 1204 10/03/16 1509 10/04/16 0049 10/04/16 0420  CKTOTAL 219  --   --   --   TROPONINI  --  0.32* 0.21* 0.19*   BNP (last 3 results) No results for input(s): PROBNP in the last 8760 hours. HbA1C: No results for input(s): HGBA1C in the last 72 hours. CBG: No results for input(s): GLUCAP in the last 168 hours. Lipid Profile: No results for input(s): CHOL, HDL, LDLCALC, TRIG, CHOLHDL, LDLDIRECT in the last 72 hours. Thyroid Function Tests: No results for input(s): TSH, T4TOTAL, FREET4, T3FREE, THYROIDAB in the last 72 hours. Anemia  Panel:  Recent Labs  10/04/16 0420  RETICCTPCT 4.6*   Sepsis Labs:  Recent Labs Lab 10/02/16 1210 10/02/16 1450 10/03/16 1158 10/03/16 1502  LATICACIDVEN 4.45* 7.16* 1.9 3.1*    Recent Results (from the past 240 hour(s))  Blood culture (routine x 2)     Status: None (Preliminary result)   Collection Time: 10/02/16  9:30 PM  Result Value Ref Range Status   Specimen Description BLOOD RIGHT WRIST  Final   Special Requests IN PEDIATRIC BOTTLE [facility]  Final   Culture  Final    NO GROWTH < 12 HOURS Performed at [facility]    Report Status PENDING  Incomplete  Blood culture (routine x 2)     Status: None (Preliminary result)   Collection Time: 10/02/16  9:33 PM  Result Value Ref Range Status   Specimen Description BLOOD BLOOD LEFT FOREARM  Final   Special Requests IN PEDIATRIC BOTTLE [place]  Final   Culture   Final    NO GROWTH < 12 HOURS Performed at [facility]    Report Status PENDING  Incomplete  MRSA PCR Screening     Status: None   Collection Time: 10/03/16  2:32 PM  Result Value Ref Range Status   MRSA by PCR NEGATIVE NEGATIVE Final    Comment:        The GeneXpert MRSA Assay (FDA approved for NASAL specimens only), is one component of a comprehensive MRSA colonization surveillance program. It is not intended to diagnose MRSA infection nor to guide or monitor treatment for MRSA infections.          Radiology Studies: Dg Chest 2 View  Result Date: 10/02/2016 CLINICAL DATA:  Cough. EXAM: CHEST  2 VIEW COMPARISON:  09/30/2016. FINDINGS: Mediastinum and hilar structures are normal. Lungs are clear. No pleural effusion or pneumothorax. Heart size normal. No acute bony abnormality . IMPRESSION: No acute cardiopulmonary disease. Electronically Signed   By: Marcello Moores  Register   On: 10/02/2016 12:23   Ct Abdomen Pelvis W Contrast  Result Date: 10/02/2016 CLINICAL DATA:  Pelvic and low back pain for 5 days. EXAM: CT ABDOMEN AND PELVIS WITH CONTRAST  TECHNIQUE: Multidetector CT imaging of the abdomen and pelvis was performed using the standard protocol following bolus administration of intravenous contrast. CONTRAST:  155m ISOVUE-300 IOPAMIDOL (ISOVUE-300) INJECTION 61% COMPARISON:  09/30/2016 FINDINGS: Lower chest: Lung bases are clear. No effusions. Heart is normal size. Hepatobiliary: Diffuse fatty infiltration of the liver. No focal hepatic abnormality or biliary ductal dilatation. Gallbladder unremarkable. Pancreas: No focal abnormality or ductal dilatation. Spleen: No focal abnormality.  Normal size. Adrenals/Urinary Tract: Small low-density area in the midpole of the left kidney, likely small cysts. No hydronephrosis, renal for renal stones. Adrenal glands and urinary bladder are unremarkable. Stomach/Bowel: Appendix is normal. Stomach, large and small bowel grossly unremarkable. Previously seen apparent wall thickening in the transverse colon no longer visualized. Vascular/Lymphatic: Dense aortic and iliac calcifications. No aneurysm or adenopathy. Reproductive: No visible focal abnormality. Other: No free fluid or free air. Musculoskeletal: No acute bony abnormality or focal bone lesion. IMPRESSION: Diffuse fatty infiltration of the liver. No acute findings. Electronically Signed   By: KRolm BaptiseM.D.   On: 10/02/2016 14:29        Scheduled Meds: . hydrocortisone   Rectal BID  . lactulose  20 g Oral TID  . pantoprazole (PROTONIX) IV  40 mg Intravenous Q12H  . piperacillin-tazobactam (ZOSYN)  IV  3.375 g Intravenous Q8H  . sodium chloride flush  3 mL Intravenous Q12H  . vancomycin  500 mg Intravenous Q8H   Continuous Infusions: . sodium chloride Stopped (10/03/16 1359)  . sodium chloride 150 mL/hr at 10/04/16 0600     LOS: 2 days    Time spent: 30 minutes    ALoretha Stapler MD [facility] Pager [phone] If 7PM-7AM, please contact night-coverage [email or web address] Password [facility]12/29/2017, 8:43 AM

## 2016-10-04 NOTE — Progress Notes (Signed)
  Echocardiogram 2D Echocardiogram has been performed.  Darlina Sicilian M 10/04/2016, 9:32 AM

## 2016-10-04 NOTE — Progress Notes (Signed)
CRITICAL VALUE ALERT  Critical value received:  Total bilirubin 37.1  Date of notification:  10/04/2016  Time of notification:  0613  Critical value read back: yes  Nurse who received alert:  Wayna Chalet, RN  MD notified (1st page):  Raliegh Ip. Schorr  Time of first page:  [phone]

## 2016-10-05 DIAGNOSIS — R748 Abnormal levels of other serum enzymes: Secondary | ICD-10-CM

## 2016-10-05 LAB — COMPREHENSIVE METABOLIC PANEL
ALK PHOS: 83 U/L (ref 38–126)
ALT: 128 U/L — AB (ref 17–63)
AST: 179 U/L — AB (ref 15–41)
Albumin: 2.3 g/dL — ABNORMAL LOW (ref 3.5–5.0)
Anion gap: 9 (ref 5–15)
BILIRUBIN TOTAL: 16.1 mg/dL — AB (ref 0.3–1.2)
BUN: 6 mg/dL (ref 6–20)
CALCIUM: 8.2 mg/dL — AB (ref 8.9–10.3)
CO2: 25 mmol/L (ref 22–32)
Chloride: 100 mmol/L — ABNORMAL LOW (ref 101–111)
GLUCOSE: 108 mg/dL — AB (ref 65–99)
POTASSIUM: 3 mmol/L — AB (ref 3.5–5.1)
Sodium: 134 mmol/L — ABNORMAL LOW (ref 135–145)
Total Protein: 5 g/dL — ABNORMAL LOW (ref 6.5–8.1)

## 2016-10-05 LAB — CBC
HEMATOCRIT: 21.4 % — AB (ref 39.0–52.0)
HEMOGLOBIN: 7.7 g/dL — AB (ref 13.0–17.0)
MCH: 32.2 pg (ref 26.0–34.0)
MCHC: 36 g/dL (ref 30.0–36.0)
MCV: 89.5 fL (ref 78.0–100.0)
Platelets: 43 10*3/uL — ABNORMAL LOW (ref 150–400)
RBC: 2.39 MIL/uL — ABNORMAL LOW (ref 4.22–5.81)
RDW: 18.2 % — AB (ref 11.5–15.5)
WBC: 11.4 10*3/uL — AB (ref 4.0–10.5)

## 2016-10-05 LAB — PROTIME-INR
INR: 1.11
PROTHROMBIN TIME: 14.3 s (ref 11.4–15.2)

## 2016-10-05 LAB — LACTIC ACID, PLASMA: Lactic Acid, Venous: 2.2 mmol/L (ref 0.5–1.9)

## 2016-10-05 MED ORDER — LACTULOSE 10 GM/15ML PO SOLN
20.0000 g | Freq: Two times a day (BID) | ORAL | Status: DC
  Administered 2016-10-05 – 2016-10-06 (×2): 20 g via ORAL
  Filled 2016-10-05 (×2): qty 30

## 2016-10-05 MED ORDER — POTASSIUM CHLORIDE 10 MEQ/100ML IV SOLN
10.0000 meq | INTRAVENOUS | Status: AC
  Administered 2016-10-05 (×3): 10 meq via INTRAVENOUS
  Filled 2016-10-05 (×3): qty 100

## 2016-10-05 MED ORDER — MAGNESIUM SULFATE 2 GM/50ML IV SOLN
2.0000 g | Freq: Once | INTRAVENOUS | Status: AC
  Administered 2016-10-05: 2 g via INTRAVENOUS
  Filled 2016-10-05: qty 50

## 2016-10-05 MED ORDER — PANTOPRAZOLE SODIUM 40 MG PO TBEC
40.0000 mg | DELAYED_RELEASE_TABLET | Freq: Every day | ORAL | Status: DC
  Administered 2016-10-06: 40 mg via ORAL
  Filled 2016-10-05: qty 1

## 2016-10-05 MED ORDER — POTASSIUM CHLORIDE CRYS ER 20 MEQ PO TBCR
40.0000 meq | EXTENDED_RELEASE_TABLET | Freq: Once | ORAL | Status: AC
  Administered 2016-10-05: 40 meq via ORAL
  Filled 2016-10-05: qty 2

## 2016-10-05 NOTE — Progress Notes (Signed)
[facility] Progress Note  Chief Complaint: acute hepatitis  Subjective  History:  He continues to improve, denies abdominal pain.  No overt GI bleeding even though Hgb lower today. Appetite is good. Nursing reports no issues.  ROS: Cardiovascular:  no chest pain Respiratory: no dyspnea  Objective:  Med list reviewed  Vital signs in last 24 hrs: Vitals:   10/05/16 0800 10/05/16 1200  BP:    Pulse:    Resp:    Temp: 97.7 F (36.5 C) 98 F (36.7 C)   BP 125/70 Pulse 80's  Physical Exam   HEENT: sclera icteric, oral mucosa moist without lesions  Neck: supple, no thyromegaly, JVD or lymphadenopathy  Cardiac: RRR without murmurs, S1S2 heard, no peripheral edema  Pulm: clear to auscultation bilaterally, normal RR and effort noted  Abdomen: soft, no tenderness, with active bowel sounds. No guarding or palpable hepatosplenomegaly  Skin; warm and dry, no rash  Neuro: alert and conversational, good spirits  Recent Labs:   Recent Labs Lab 10/04/16 0420 10/04/16 1313 10/05/16 0327  WBC 13.2* 12.2* 11.4*  HGB 9.9* 8.4* 7.7*  HCT 26.8* 22.8* 21.4*  PLT 51* 46* 43*    Recent Labs Lab 10/05/16 0327  NA 134*  K 3.0*  CL 100*  CO2 25  BUN 6  ALBUMIN 2.3*  ALKPHOS 83  ALT 128*  AST 179*  GLUCOSE 108*    Recent Labs Lab 10/05/16 0327  INR 1.11   Hepatic Function Latest Ref Rng & Units 10/05/2016 10/04/2016 10/04/2016  Total Protein 6.5 - 8.1 g/dL 5.0(L) - 5.3(L)  Albumin 3.5 - 5.0 g/dL 2.3(L) - 2.4(L)  AST 15 - 41 U/L 179(H) - 258(H)  ALT 17 - 63 U/L 128(H) - 149(H)  Alk Phosphatase 38 - 126 U/L 83 - 93  Total Bilirubin 0.3 - 1.2 mg/dL 16.1(H) 30.4(HH) 31.6(HH)  Bilirubin, Direct 0.1 - 0.5 mg/dL - 20.1(H) -    _0 @ Assessment: Acute alcoholic cholestatic hepatitis Anemia of acute illness SIRS, resolved Mild hepatic encephalopathy due to the severe hepatitis, controlled on lactulose.  Plan:  He can go home tomorrow from  my perspective. He should see our PA Tye Savoy in about a month - we will arrange. Lactulose 20 grams twice daily for a week after discharge.  Nelida Meuse III Pager [phone] Mon-Fri 8a-5p [phone] after 5p, weekends, holidays

## 2016-10-05 NOTE — Progress Notes (Addendum)
PROGRESS NOTE    Steven Li  OZH:[id number] DOB: September 05, 1966 DOA: 10/02/2016 PCP: No PCP Per Patient    Brief Narrative:  50 year old man PMH alcoholism, presented initially Christmas day with abdominal pain, nausea or vomiting. Admitted at [facility], subsequently left [facility] 12/26. Presented December 27 to the emergency department at [facility], was accepted to a stepdown bed at [facility] that day by internal medicine. However as of the time of this dictation, there is no stepdown or telemetry bed available at [facility]. Therefore the hospitalist service at [facility] was first contacted at [phone] 12/27 and request for admission made at that time.  Hospital course summarized below. The patient reports symptoms began 12/23 with diffuse abdominal pain, vomiting of blood, hematuria, blood per rectum and precordial chest pain. He volunteers that he is alcoholic, last drink 12/23. No significant NSAID use. He was admitted for elevated liver function tests and colitis of the transverse colon.   Imaging: CT abdomen and pelvis showed diffuse fatty infiltration of the liver. No acute findings per radiology. Pancreas was described as no focal abnormality.  Chest x-ray independently reviewed, hyperinflation. No acute disease.   Assessment & Plan:   Principal Problem:   Hepatitis Active Problems:   Abnormal LFTs   Acute upper GI bleed   Anemia due to blood loss, acute   Hyperbilirubinemia   Elevated troponin   Anemia  Acute hepatic injury with mixed elevation of LFTs (transaminases and total bilirubin), thrombocytopenia w/o evidence of obstructive process on CT scan , probably secondary to alcohol use.  GI consulted and recommendations to continue with lactulose to get 2 to 3 bowel movements per day and outpatient follow up for EGD/colonoscopy.  - Acute viral hepatitis panel negative, hiv antibody is negative.  - Tylenol level negative - PTT WNL, INR WNL - bilirubin is finally trending  down, liver function tests are improving.  - Pt remains afebrile, and vital signs mostly WNL - no further recommendations from GI. They have signed off.   GIB, Normocytic anemia - baseline hemoglobin around 13. - Trending downward 13 >> 7 over 3 days). Asymptomatic.  - Trend hemoglobin. Type and cross 4 units. May need transfusion. (ok with patient) - s/p blood transfusion. Transfuse to keep hemoglobin greater than 7.  - outpatient follow up for EGD/ colonoscopy by GI.   Elevated lactic acid of unclear etiology - REPEAT lactic acid today.   Elevated troponin - trending down, currently chest pain free. EKG does not show any ischemic changes. - etiology possibly demand ischemia secondary to anemia, +/-cardiomyopathy such as alcohol-induced and myocarditis would be considerations - Echo shows  Unusual wall motion abnormality pattern with basal   inferoseptal, basal inferior, basal inferolateral, and basal   anterolateral severe hypokinesis. The estimated ejection fraction   was 50%. Features are consistent with a pseudonormal left   ventricular filling pattern, with concomitant abnormal relaxation   and increased filling pressure (grade 2 diastolic dysfunction). - cardiology will be consulted.   Hypokalemia  - IV and po potassium ordered this am - repeat labs in am.   Leukocytosis - Trending down - Etiology unclear, afebrile, empirically on IV antibiotics. So far, cultures have been negative. D/c IV vancomycin , but continue the zosyn.    Thrombocytopenia  - Etiology unclear - Normal on previous admission - HIT antibody ordered.  - no heparin, lovenox or blood thinners  Gross hematuria - Etiology unclear, resolved.  - understands will need outpatient f/u with urology  Alcohol abuse since age 29 - drinks a 12 pack of beer daily - last drink 1 week ago - no signs of withdrawal currently  Colitis of the transverse colon:  Resolved on the CT from 12/27. On IV zosyn,. No  diarrhea, abdominal pain has improved.  Afebrile and leukocytosis improving.  Plan to  D/c antibiotics on 10/07/2016.     DVT prophylaxis:SCDs Code Status: full code Family Communication: none at bedside.  Disposition Plan: possibly in 1 to 2 days, when bilirubin and llfts improve.    Consultants:   Gastroenterology  Procedures:   None  Antimicrobials:   Vancomycin 12/28 to 12/30  Zosyn 12/28 >>   Subjective: Off oxygen.   Objective: Vitals:   10/05/16 0200 10/05/16 0400 10/05/16 0600 10/05/16 0800  BP: 94/62 125/87 128/89   Pulse:      Resp: 16 17 15    Temp:  98.3 F (36.8 C)  97.7 F (36.5 C)  TempSrc:  Oral  Oral  SpO2: 98% 99% 99%   Weight:      Height:        Intake/Output Summary (Last 24 hours) at 10/05/16 1259 Last data filed at 10/05/16 0600  Gross per 24 hour  Intake             4270 ml  Output                0 ml  Net             4270 ml   Filed Weights   10/02/16 1022 10/02/16 1024 10/03/16 1438  Weight: 59 kg (130 lb) 59 kg (130 lb) 50.4 kg (111 lb 1.8 oz)    Examination:  General exam: Appears calm and comfortable, thin, cachectic male Respiratory system: Clear to auscultation. Respiratory effort normal. Cardiovascular system: S1 & S2 heard, RRR. No JVD, murmurs, rubs, gallops or clicks. No pedal edema. Gastrointestinal system: Abdomen is nondistended, soft and nontender. No organomegaly or masses felt. Normal bowel sounds heard. Central nervous system: Alert and oriented. No focal neurological deficits. Extremities: Symmetric 5 x 5 power. Skin: No rashes, lesions or ulcers Psychiatry: Unclear insight into severity of current medical status. Mood & affect appropriate.     Data Reviewed: I have personally reviewed following labs and imaging studies  CBC:  Recent Labs Lab 10/02/16 1204 10/03/16 0910 10/03/16 1230 10/04/16 0049 10/04/16 0420 10/04/16 1313 10/05/16 0327  WBC 19.9* 15.0* 15.2* 13.7* 13.2* 12.2* 11.4*    NEUTROABS 17.1* 13.0*  --   --   --   --   --   HGB 10.2* 7.0* 7.1* 9.1* 9.9* 8.4* 7.7*  HCT 30.5* 20.9* 20.5* 24.6* 26.8* 22.8* 21.4*  MCV 99.7 100.0 99.5 90.1 89.9 88.7 89.5  PLT 83* 62* 52* 44* 51* 46* 43*   Basic Metabolic Panel:  Recent Labs Lab 10/02/16 1204 10/03/16 0910 10/04/16 0420 10/04/16 1313 10/05/16 0327  NA 134* 137 137 130* 134*  K 4.4 3.1* 3.1* 3.0* 3.0*  CL 96* 100* 102 96* 100*  CO2 26 26 26 24 25   GLUCOSE 107* 108* 90 100* 108*  BUN 21* 16 14 10 6   CREATININE <0.30* <0.30* <0.30* <0.30* <0.30*  CALCIUM 9.3 8.7* 9.1 8.3* 8.2*  MG  --   --   --  1.6*  --   PHOS  --   --   --  2.5  --    GFR: CrCl cannot be calculated (This lab value cannot be used to calculate  CrCl because it is not a number: <0.30). Liver Function Tests:  Recent Labs Lab 10/02/16 1204 10/03/16 0910 10/04/16 0420 10/04/16 1313 10/05/16 0327  AST 492* 394* 329* 258* 179*  ALT 338* 219* 180* 149* 128*  ALKPHOS 139* 128* 102 93 83  BILITOT 28.9* 32.7* 37.1* 30.4*  31.6* 16.1*  PROT 6.2* 5.0* 5.4* 5.3* 5.0*  ALBUMIN 3.4* 2.8* 2.7* 2.4* 2.3*    Recent Labs Lab 09/30/16 0556 10/04/16 0420  LIPASE 30 210*    Recent Labs Lab 10/04/16 0420  AMMONIA 49*   Coagulation Profile:  Recent Labs Lab 09/30/16 0849 10/02/16 1204 10/03/16 0910 10/04/16 0420 10/05/16 0327  INR 1.43 1.15 1.14 1.21 1.11   Cardiac Enzymes:  Recent Labs Lab 10/02/16 1204 10/03/16 1509 10/04/16 0049 10/04/16 0420  CKTOTAL 219  --   --   --   TROPONINI  --  0.32* 0.21* 0.19*   BNP (last 3 results) No results for input(s): PROBNP in the last 8760 hours. HbA1C: No results for input(s): HGBA1C in the last 72 hours. CBG: No results for input(s): GLUCAP in the last 168 hours. Lipid Profile: No results for input(s): CHOL, HDL, LDLCALC, TRIG, CHOLHDL, LDLDIRECT in the last 72 hours. Thyroid Function Tests: No results for input(s): TSH, T4TOTAL, FREET4, T3FREE, THYROIDAB in the last 72  hours. Anemia Panel:  Recent Labs  10/04/16 0420 10/04/16 1313  VITAMINB12  --  1,360*  FOLATE  --  9.8  FERRITIN  --  >7,500*  TIBC  --  175*  IRON  --  213*  RETICCTPCT 4.6*  --    Sepsis Labs:  Recent Labs Lab 10/02/16 1210 10/02/16 1450 10/03/16 1158 10/03/16 1502  LATICACIDVEN 4.45* 7.16* 1.9 3.1*    Recent Results (from the past 240 hour(s))  Blood culture (routine x 2)     Status: None (Preliminary result)   Collection Time: 10/02/16  9:30 PM  Result Value Ref Range Status   Specimen Description BLOOD RIGHT WRIST  Final   Special Requests IN PEDIATRIC BOTTLE 3CC  Final   Culture   Final    NO GROWTH 3 DAYS Performed at [facility]    Report Status PENDING  Incomplete  Blood culture (routine x 2)     Status: None (Preliminary result)   Collection Time: 10/02/16  9:33 PM  Result Value Ref Range Status   Specimen Description BLOOD BLOOD LEFT FOREARM  Final   Special Requests IN PEDIATRIC BOTTLE 1CC  Final   Culture   Final    NO GROWTH 3 DAYS Performed at [facility]    Report Status PENDING  Incomplete  MRSA PCR Screening     Status: None   Collection Time: 10/03/16  2:32 PM  Result Value Ref Range Status   MRSA by PCR NEGATIVE NEGATIVE Final    Comment:        The GeneXpert MRSA Assay (FDA approved for NASAL specimens only), is one component of a comprehensive MRSA colonization surveillance program. It is not intended to diagnose MRSA infection nor to guide or monitor treatment for MRSA infections.          Radiology Studies: No results found.      Scheduled Meds: . feeding supplement (ENSURE ENLIVE)  237 mL Oral BID BM  . hydrocortisone   Rectal BID  . lactulose  20 g Oral TID  . multivitamin with minerals  1 tablet Oral Daily  . pantoprazole  40 mg Oral BID  .  piperacillin-tazobactam (ZOSYN)  IV  3.375 g Intravenous Q8H  . potassium chloride  10 mEq Intravenous Q1 Hr x 3  . sodium chloride flush  3 mL  Intravenous Q12H  . vancomycin  500 mg Intravenous Q8H   Continuous Infusions: . sodium chloride 150 mL/hr at 10/05/16 0600     LOS: 3 days    Time spent: 30 minutes    Mayan Kloepfer, MD [facility] Pager [phone]  If 7PM-7AM, please contact night-coverage [email or web address] Password [email or web address] 10/05/2016, 12:59 PM

## 2016-10-05 NOTE — Progress Notes (Signed)
Critical lactic acid level 2.2 Dr. Karleen Hampshire notified

## 2016-10-06 ENCOUNTER — Encounter: Payer: Self-pay | Admitting: Physician Assistant

## 2016-10-06 DIAGNOSIS — R931 Abnormal findings on diagnostic imaging of heart and coronary circulation: Secondary | ICD-10-CM

## 2016-10-06 LAB — CBC
HCT: 25.8 % — ABNORMAL LOW (ref 39.0–52.0)
HEMOGLOBIN: 9.1 g/dL — AB (ref 13.0–17.0)
MCH: 33.3 pg (ref 26.0–34.0)
MCHC: 35.3 g/dL (ref 30.0–36.0)
MCV: 94.5 fL (ref 78.0–100.0)
Platelets: 83 10*3/uL — ABNORMAL LOW (ref 150–400)
RBC: 2.73 MIL/uL — ABNORMAL LOW (ref 4.22–5.81)
RDW: 18.8 % — ABNORMAL HIGH (ref 11.5–15.5)
WBC: 11.9 10*3/uL — AB (ref 4.0–10.5)

## 2016-10-06 LAB — TYPE AND SCREEN
ABO/RH(D): A POS
ANTIBODY SCREEN: NEGATIVE
UNIT DIVISION: 0
UNIT DIVISION: 0
UNIT DIVISION: 0
Unit division: 0

## 2016-10-06 LAB — MAGNESIUM: MAGNESIUM: 2.1 mg/dL (ref 1.7–2.4)

## 2016-10-06 LAB — COMPREHENSIVE METABOLIC PANEL
ALT: 111 U/L — AB (ref 17–63)
ANION GAP: 6 (ref 5–15)
AST: 114 U/L — ABNORMAL HIGH (ref 15–41)
Albumin: 2.3 g/dL — ABNORMAL LOW (ref 3.5–5.0)
Alkaline Phosphatase: 80 U/L (ref 38–126)
BUN: <5 mg/dL — ABNORMAL LOW (ref 6–20)
CHLORIDE: 99 mmol/L — AB (ref 101–111)
CO2: 28 mmol/L (ref 22–32)
CREATININE: 0.49 mg/dL — AB (ref 0.61–1.24)
Calcium: 8.4 mg/dL — ABNORMAL LOW (ref 8.9–10.3)
GFR calc Af Amer: >60 mL/min (ref >60)
Glucose, Bld: 96 mg/dL (ref 65–99)
POTASSIUM: 3.1 mmol/L — AB (ref 3.5–5.1)
SODIUM: 133 mmol/L — AB (ref 135–145)
Total Bilirubin: 6.3 mg/dL — ABNORMAL HIGH (ref 0.3–1.2)
Total Protein: 5.1 g/dL — ABNORMAL LOW (ref 6.5–8.1)

## 2016-10-06 LAB — HEPARIN INDUCED PLATELET AB (HIT ANTIBODY): HEPARIN INDUCED PLT AB: 0.23 {OD_unit} (ref 0.000–0.400)

## 2016-10-06 LAB — FERRITIN: FERRITIN: 4018 ng/mL — AB (ref 24–336)

## 2016-10-06 MED ORDER — PANTOPRAZOLE SODIUM 40 MG PO TBEC: 40.0000 mg | DELAYED_RELEASE_TABLET | Freq: Every day | ORAL | 0 refills | Status: DC

## 2016-10-06 MED ORDER — HYDROCORTISONE 2.5 % RE CREA: TOPICAL_CREAM | Freq: Two times a day (BID) | RECTAL | 0 refills | Status: DC

## 2016-10-06 MED ORDER — ENSURE ENLIVE PO LIQD: 237.0000 mL | Freq: Two times a day (BID) | ORAL | 12 refills | Status: DC

## 2016-10-06 MED ORDER — POTASSIUM CHLORIDE CRYS ER 20 MEQ PO TBCR
40.0000 meq | EXTENDED_RELEASE_TABLET | Freq: Once | ORAL | Status: AC
  Administered 2016-10-06: 40 meq via ORAL
  Filled 2016-10-06: qty 2

## 2016-10-06 MED ORDER — LACTULOSE 10 GM/15ML PO SOLN: 20.0000 g | Freq: Two times a day (BID) | ORAL | 0 refills | Status: DC

## 2016-10-06 MED ORDER — ADULT MULTIVITAMIN W/MINERALS CH: 1.0000 | ORAL_TABLET | Freq: Every day | ORAL | 0 refills | Status: DC

## 2016-10-06 MED ORDER — POTASSIUM CHLORIDE CRYS ER 20 MEQ PO TBCR: 40.0000 meq | EXTENDED_RELEASE_TABLET | Freq: Once | ORAL | Status: DC

## 2016-10-06 MED ORDER — HYDROCORTISONE 2.5 % RE CREA
TOPICAL_CREAM | Freq: Two times a day (BID) | RECTAL | 0 refills | Status: DC
Start: 2016-10-06 — End: 2017-08-11

## 2016-10-06 MED ORDER — LACTULOSE 10 GM/15ML PO SOLN: 20.0000 g | Freq: Two times a day (BID) | ORAL | 1 refills | Status: DC

## 2016-10-06 NOTE — Discharge Summary (Signed)
Physician Discharge Summary  Steven Li [id number] DOB: 1966/05/04 DOA: 10/02/2016  PCP: No PCP Per Patient  Admit date: 10/02/2016 Discharge date: 10/06/2016  Admitted From: Home Disposition:  Home.   Recommendations for Outpatient Follow-up:  1. Follow up with PCP in 1-2 weeks 2. Please obtain CMP/CBC in one week 3. Please follow up with gastroenterology in one week as recommended.  4. Please follow up with cardiology Dr Harrington Challenger as recommended.   Discharge Condition:stable.  CODE STATUS:full code.  Diet recommendation: Heart Healthy   Brief/Interim Summary: 50 year old man PMH alcoholism, presented initially Christmas day with abdominal pain, nausea or vomiting. Admitted at [facility], subsequently left [facility] 12/26. PresentedDecember 27 to the emergency department at [facility], was accepted to a stepdown bed at [facility] that day by internal medicine. However as of the time of this dictation, there is no stepdown or telemetry bed available at [facility]. Therefore the hospitalist service at [facility] was first contacted at [phone] 12/27 and request for admission made at that time.  Hospital course summarized below. The patient reports symptoms began 12/23 with diffuse abdominal pain, vomiting of blood, hematuria, blood per rectum and precordial chest pain. He volunteers that he is alcoholic, last drink [id number]. No significant NSAID use. He was admitted for elevated liver function tests and colitis of the transverse colon.   CT abdomen and pelvis showed diffuse fatty infiltration of the liver. No acute findings per radiology. Pancreas was described as no focal abnormality.  Chest x-ray independently reviewed, hyperinflation. No acute disease.  Discharge Diagnoses:  Principal Problem:   Hepatitis Active Problems:   Abnormal LFTs   Acute upper GI bleed   Anemia due to blood loss, acute   Hyperbilirubinemia   Elevated troponin   Anemia  Acute hepatic injury with mixed  elevation of LFTs (transaminases and total bilirubin), thrombocytopenia w/o evidence of obstructive process on CT scan , probably secondary to alcohol use.  GI consulted and recommendations to continue with lactulose to get 2 to 3 bowel movements per day and outpatient follow up for EGD/colonoscopy.  -Acute viral hepatitis panel negative, hiv antibody is negative.  - ferritin elevated, ? Hemosiderosis. Follow up ferritin as outpatient with gastroenterology or PCP.  - Tylenol level negative - PTT WNL, INR WNL - bilirubin is finally trending down, liver function tests are improving.  - Pt remains afebrile, and vital signs mostly WNL - no further recommendations from GI. They have signed off.   GIB, Normocytic anemia - baseline hemoglobin around 13. - Trending downward 13 >> 7 over 3 days). Asymptomatic.  - s/p blood transfusion. Transfuse to keep hemoglobin greater than 7.  - outpatient follow up for EGD/ colonoscopy by GI.   Elevated lactic acidof unclear etiology - REPEAT lactic acid today.   Elevated troponin - trending down, currently chest pain free. EKG does not show any ischemic changes. - etiology possibly demand ischemia secondary to anemia, +/-cardiomyopathy such as alcohol-induced and myocarditis would be considerations - Echo shows Unusual wall motion abnormality pattern with basal inferoseptal, basal inferior, basal inferolateral, and basal anterolateral severe hypokinesis. The estimated ejection fraction was 50%. Features are consistent with a pseudonormal left ventricular filling pattern, with concomitant abnormal relaxation and increased filling pressure (grade 2 diastolic dysfunction). - cardiology  Consulted and recommendations given. .   Hypokalemia  - IV and po potassium ordered this am - repeat labs in am show slight improvement.  - further supplementation given.   Leukocytosis - Trending down, ?  Reactive.  - Etiology unclear, afebrile,  empirically on IV antibiotics. So far, cultures have been negative. D/c IV vancomycin , and zosyn.   Thrombocytopenia  - Etiology unclear, improving.  - Normal on previous admission - HIT antibody ordered and is negative. .  - no heparin, lovenox or blood thinners  Gross hematuria - Etiology unclear, resolved.  - understands will need outpatient f/u with urology  Alcohol abuse since age 50 - drinks a 12 pack of beer daily - last drink 1 week ago - no signs of withdrawal currently  Colitis of the transverse colon:  Resolved on the CT from 12/27. Marland Kitchen No diarrhea, abdominal pain has improved.  Afebrile and leukocytosis improving.      Discharge Instructions  Discharge Instructions    Diet - low sodium heart healthy    Complete by:  As directed    Discharge instructions    Complete by:  As directed    Please follow up with your PCP in one week, with gastroenterology as recommended and cardiology clinic as recommended.  Their office will call you with appointment schedule.     Allergies as of 10/06/2016   No Known Allergies     Medication List    STOP taking these medications   ibuprofen 200 MG tablet Commonly known as:  ADVIL,MOTRIN   lansoprazole 30 MG capsule Commonly known as:  PREVACID     TAKE these medications   bismuth subsalicylate [id number] suspension Commonly known as:  PEPTO BISMOL Take 30 mLs by mouth every 6 (six) hours as needed for indigestion.   EYE DROPS OP Place 2 drops into both eyes as needed (for red eyes).   feeding supplement (ENSURE ENLIVE) Liqd Take 237 mLs by mouth 2 (two) times daily between meals.   hydrocortisone 2.5 % rectal cream Commonly known as:  ANUSOL-HC Place rectally 2 (two) times daily.   lactulose 10 GM/15ML solution Commonly known as:  CHRONULAC Take 30 mLs (20 g total) by mouth 2 (two) times daily.   multivitamin with minerals Tabs tablet Take 1 tablet by mouth daily. Start taking on:  10/07/2016    pantoprazole 40 MG tablet Commonly known as:  PROTONIX Take 1 tablet (40 mg total) by mouth daily. Start taking on:  10/07/2016       No Known Allergies  Consultations:  Cardiology  Gastroenterology    Procedures/Studies: Dg Chest 2 View  Result Date: 10/02/2016 CLINICAL DATA:  Cough. EXAM: CHEST  2 VIEW COMPARISON:  09/30/2016. FINDINGS: Mediastinum and hilar structures are normal. Lungs are clear. No pleural effusion or pneumothorax. Heart size normal. No acute bony abnormality . IMPRESSION: No acute cardiopulmonary disease. Electronically Signed   By: Marcello Moores  Register   On: 10/02/2016 12:23   Ct Abdomen Pelvis W Contrast  Result Date: 10/02/2016 CLINICAL DATA:  Pelvic and low back pain for 5 days. EXAM: CT ABDOMEN AND PELVIS WITH CONTRAST TECHNIQUE: Multidetector CT imaging of the abdomen and pelvis was performed using the standard protocol following bolus administration of intravenous contrast. CONTRAST:  17mL ISOVUE-300 IOPAMIDOL (ISOVUE-300) INJECTION 61% COMPARISON:  09/30/2016 FINDINGS: Lower chest: Lung bases are clear. No effusions. Heart is normal size. Hepatobiliary: Diffuse fatty infiltration of the liver. No focal hepatic abnormality or biliary ductal dilatation. Gallbladder unremarkable. Pancreas: No focal abnormality or ductal dilatation. Spleen: No focal abnormality.  Normal size. Adrenals/Urinary Tract: Small low-density area in the midpole of the left kidney, likely small cysts. No hydronephrosis, renal for renal stones. Adrenal glands and urinary  bladder are unremarkable. Stomach/Bowel: Appendix is normal. Stomach, large and small bowel grossly unremarkable. Previously seen apparent wall thickening in the transverse colon no longer visualized. Vascular/Lymphatic: Dense aortic and iliac calcifications. No aneurysm or adenopathy. Reproductive: No visible focal abnormality. Other: No free fluid or free air. Musculoskeletal: No acute bony abnormality or focal bone lesion.  IMPRESSION: Diffuse fatty infiltration of the liver. No acute findings. Electronically Signed   By: Rolm Baptise M.D.   On: 10/02/2016 14:29   Ct Abdomen Pelvis W Contrast  Result Date: 09/30/2016 CLINICAL DATA:  Abdominal pain and transaminitis and alcohol abuse. EXAM: CT ABDOMEN AND PELVIS WITH CONTRAST TECHNIQUE: Multidetector CT imaging of the abdomen and pelvis was performed using the standard protocol following bolus administration of intravenous contrast. CONTRAST:  153mL ISOVUE-300 IOPAMIDOL (ISOVUE-300) INJECTION 61% COMPARISON:  11/09/2015 and 07/07/2014 FINDINGS: Lower chest: Within normal. Hepatobiliary: The suggestive a subcentimeter cyst over the dome of the liver. Liver and biliary tree are otherwise within normal. Gallbladder is normal. Pancreas: Within normal. Spleen: Within normal. Adrenals/Urinary Tract: Adrenal glands are normal. Kidneys are normal size without hydronephrosis or nephrolithiasis. There is a 1.3 cm hypodensity over the mid pole left kidney without significant change likely a slightly hyperdense cyst. Ureters and bladder are normal. Stomach/Bowel: Eccentric wall thickening at the gastroesophageal junction. Stomach and small bowel are otherwise within normal. Appendix is normal. Subtle wall thickening of the transverse colon without significant adjacent inflammatory change or free fluid. Vascular/Lymphatic: Calcified plaque over the abdominal aorta and iliac arteries. Remaining vascular structures are within normal. No adenopathy. Reproductive: Within normal. Other: None. Musculoskeletal: Minimal degenerative change of the hips. IMPRESSION: Minimal wall thickening of the transverse colon which may be due to mild acute colitis. The mild eccentric wall thickening of the gastroesophageal junction. Consider further evaluation with barium swallow or endoscopic correlation. 1.3 cm hypodensity over the mid pole left kidney unchanged likely a slightly hyperdense cyst. Recommend followup  CT 1 year. Subcentimeter hypodensity over the dome of the liver unchanged likely a cyst. Aortic atherosclerosis. Electronically Signed   By: Marin Olp M.D.   On: 09/30/2016 08:38   Dg Abdomen Acute W/chest  Result Date: 09/30/2016 CLINICAL DATA:  Mid abdominal pain 3 days with nausea, vomiting and diarrhea. Black colored stools. EXAM: DG ABDOMEN ACUTE W/ 1V CHEST COMPARISON:  Chest x-ray 07/02/2016 an abdominopelvic CT 07/07/2014 FINDINGS: Lungs are adequately inflated and otherwise clear. Cardiomediastinal silhouette and remainder of the chest is within normal. Abdominopelvic images demonstrate a nonobstructive bowel gas pattern. No evidence of free peritoneal air. No mass or mass effect. Remaining bones and soft tissues are notable for minimal degenerate change of the hips. IMPRESSION: Nonobstructive bowel gas pattern. No acute cardiopulmonary disease. Electronically Signed   By: Marin Olp M.D.   On: 09/30/2016 07:24       Subjective: No new complaints.   Discharge Exam: Vitals:   10/05/16 2050 10/06/16 0434  BP: 131/86 107/84  Pulse: 82 68  Resp: 19 17  Temp: 98.6 F (37 C) 98.5 F (36.9 C)   Vitals:   10/05/16 1600 10/05/16 1706 10/05/16 2050 10/06/16 0434  BP: (!) 155/80 (!) 143/95 131/86 107/84  Pulse:   82 68  Resp: 17 18 19 17   Temp: 98.1 F (36.7 C) 98.8 F (37.1 C) 98.6 F (37 C) 98.5 F (36.9 C)  TempSrc: Oral Oral Oral Oral  SpO2: 99% 98% 100% 100%  Weight:      Height:  General: Pt is alert, awake, not in acute distress Cardiovascular: RRR, S1/S2 +, no rubs, no gallops Respiratory: CTA bilaterally, no wheezing, no rhonchi Abdominal: Soft, NT, ND, bowel sounds + Extremities: no edema, no cyanosis    The results of significant diagnostics from this hospitalization (including imaging, microbiology, ancillary and laboratory) are listed below for reference.     Microbiology: Recent Results (from the past 240 hour(s))  Blood culture (routine x  2)     Status: None (Preliminary result)   Collection Time: 10/02/16  9:30 PM  Result Value Ref Range Status   Specimen Description BLOOD RIGHT WRIST  Final   Special Requests IN PEDIATRIC BOTTLE 3CC  Final   Culture   Final    NO GROWTH 4 DAYS Performed at [facility]    Report Status PENDING  Incomplete  Blood culture (routine x 2)     Status: None (Preliminary result)   Collection Time: 10/02/16  9:33 PM  Result Value Ref Range Status   Specimen Description BLOOD BLOOD LEFT FOREARM  Final   Special Requests IN PEDIATRIC BOTTLE [place]  Final   Culture   Final    NO GROWTH 4 DAYS Performed at [facility]    Report Status PENDING  Incomplete  MRSA PCR Screening     Status: None   Collection Time: 10/03/16  2:32 PM  Result Value Ref Range Status   MRSA by PCR NEGATIVE NEGATIVE Final    Comment:        The GeneXpert MRSA Assay (FDA approved for NASAL specimens only), is one component of a comprehensive MRSA colonization surveillance program. It is not intended to diagnose MRSA infection nor to guide or monitor treatment for MRSA infections.      Labs: BNP (last 3 results) No results for input(s): BNP in the last 8760 hours. Basic Metabolic Panel:  Recent Labs Lab 10/03/16 0910 10/04/16 0420 10/04/16 1313 10/05/16 0327 10/06/16 0540  NA 137 137 130* 134* 133*  K 3.1* 3.1* 3.0* 3.0* 3.1*  CL 100* 102 96* 100* 99*  CO2 26 26 24 25 28   GLUCOSE 108* 90 100* 108* 96  BUN 16 14 10 6  <5*  CREATININE <0.30* <0.30* <0.30* <0.30* 0.49*  CALCIUM 8.7* 9.1 8.3* 8.2* 8.4*  MG  --   --  1.6*  --  2.1  PHOS  --   --  2.5  --   --    Liver Function Tests:  Recent Labs Lab 10/03/16 0910 10/04/16 0420 10/04/16 1313 10/05/16 0327 10/06/16 0540  AST 394* 329* 258* 179* 114*  ALT 219* 180* 149* 128* 111*  ALKPHOS 128* 102 93 83 80  BILITOT 32.7* 37.1* 30.4*  31.6* 16.1* 6.3*  PROT 5.0* 5.4* 5.3* 5.0* 5.1*  ALBUMIN 2.8* 2.7* 2.4* 2.3* 2.3*    Recent  Labs Lab 09/30/16 0556 10/04/16 0420  LIPASE 30 210*    Recent Labs Lab 10/04/16 0420  AMMONIA 49*   CBC:  Recent Labs Lab 10/02/16 1204 10/03/16 0910  10/04/16 0049 10/04/16 0420 10/04/16 1313 10/05/16 0327 10/06/16 0925  WBC 19.9* 15.0*  < > 13.7* 13.2* 12.2* 11.4* 11.9*  NEUTROABS 17.1* 13.0*  --   --   --   --   --   --   HGB 10.2* 7.0*  < > 9.1* 9.9* 8.4* 7.7* 9.1*  HCT 30.5* 20.9*  < > 24.6* 26.8* 22.8* 21.4* 25.8*  MCV 99.7 100.0  < > 90.1 89.9 88.7 89.5 94.5  PLT 83* 62*  < > 44* 51* 46* 43* 83*  < > = values in this interval not displayed. Cardiac Enzymes:  Recent Labs Lab 10/02/16 1204 10/03/16 1509 10/04/16 0049 10/04/16 0420  CKTOTAL 219  --   --   --   TROPONINI  --  0.32* 0.21* 0.19*   BNP: Invalid input(s): POCBNP CBG: No results for input(s): GLUCAP in the last 168 hours. D-Dimer No results for input(s): DDIMER in the last 72 hours. Hgb A1c No results for input(s): HGBA1C in the last 72 hours. Lipid Profile No results for input(s): CHOL, HDL, LDLCALC, TRIG, CHOLHDL, LDLDIRECT in the last 72 hours. Thyroid function studies No results for input(s): TSH, T4TOTAL, T3FREE, THYROIDAB in the last 72 hours.  Invalid input(s): FREET3 Anemia work up  Recent Labs  10/04/16 0420 10/04/16 1313 10/06/16 0925  VITAMINB12  --  1,360*  --   FOLATE  --  9.8  --   FERRITIN  --  >7,500* 4,018*  TIBC  --  175*  --   IRON  --  213*  --   RETICCTPCT 4.6*  --   --    Urinalysis    Component Value Date/Time   COLORURINE RED (A) 10/02/2016 1241   APPEARANCEUR CLOUDY (A) 10/02/2016 1241   LABSPEC 1.015 10/02/2016 1241   PHURINE 5.0 10/02/2016 1241   GLUCOSEU NEGATIVE 10/02/2016 1241   HGBUR LARGE (A) 10/02/2016 1241   BILIRUBINUR SMALL (A) 10/02/2016 1241   KETONESUR NEGATIVE 10/02/2016 1241   PROTEINUR 100 (A) 10/02/2016 1241   UROBILINOGEN 0.2 07/07/2014 0755   NITRITE NEGATIVE 10/02/2016 1241   LEUKOCYTESUR NEGATIVE 10/02/2016 1241   Sepsis  Labs Invalid input(s): PROCALCITONIN,  WBC,  LACTICIDVEN Microbiology Recent Results (from the past 240 hour(s))  Blood culture (routine x 2)     Status: None (Preliminary result)   Collection Time: 10/02/16  9:30 PM  Result Value Ref Range Status   Specimen Description BLOOD RIGHT WRIST  Final   Special Requests IN PEDIATRIC BOTTLE 3CC  Final   Culture   Final    NO GROWTH 4 DAYS Performed at [facility]    Report Status PENDING  Incomplete  Blood culture (routine x 2)     Status: None (Preliminary result)   Collection Time: 10/02/16  9:33 PM  Result Value Ref Range Status   Specimen Description BLOOD BLOOD LEFT FOREARM  Final   Special Requests IN PEDIATRIC BOTTLE [place]  Final   Culture   Final    NO GROWTH 4 DAYS Performed at [facility]    Report Status PENDING  Incomplete  MRSA PCR Screening     Status: None   Collection Time: 10/03/16  2:32 PM  Result Value Ref Range Status   MRSA by PCR NEGATIVE NEGATIVE Final    Comment:        The GeneXpert MRSA Assay (FDA approved for NASAL specimens only), is one component of a comprehensive MRSA colonization surveillance program. It is not intended to diagnose MRSA infection nor to guide or monitor treatment for MRSA infections.      Time coordinating discharge: Over 30 minutes  SIGNED:   Hosie Poisson, MD  [facility] 10/06/2016, 5:05 PM Pager   If 7PM-7AM, please contact night-coverage [email or web address] Password [email or web address]

## 2016-10-06 NOTE — Progress Notes (Signed)
Pt IV removed.  Tele d/c'd.  AVS and follow up care reviewed at bedside. Informed pt about calling insurance [employer] to get a list of providers.  No further questions. Prescriptions and work note provided to pt. Kizzie Ide, RN

## 2016-10-06 NOTE — Progress Notes (Signed)
I have sent a message to our [address] office's scheduler requesting a follow-up appointment, and our office will call the patient with this information.  Sora Olivo PA-C  

## 2016-10-06 NOTE — Consult Note (Signed)
Primary Physician: Primary Cardiologist:  New  Asked to see re abnormal echo    HPI:  Patient is a 50 yo with history of ETOHism  No known CAD .  Amdtited on Christmas day with abdominal pain, N/V. LFTs elevated   Left AMA  Represented on 12/28 with hemetemesis, blood per rectum     Lactic acid elevated 7.1.  REfused lactulose  BIli 32  Trop  Peak 0.73  Hgb 7  Plt 62   Seen by GI  Hepatitis felt not to be all due to EtOH   Pt has improved over the past few days He says he feels good  Says he has had abdominal pain but never chest pain Not eating good at home  Was drinking but says he is going to quit Smokes 1 pack per week.    Echo showed LVE 50%  with hypokinesi of basal inferoseptum, inferior, inferolateral and anterolateral wall.   Note ferritin increased at greater than 7500      Past Medical History:  Diagnosis Date  . ETOH abuse   . Hypertension     Medications Prior to Admission  Medication Sig Dispense Refill  . bismuth subsalicylate (PEPTO BISMOL) 262 MG/15ML suspension Take 30 mLs by mouth every 6 (six) hours as needed for indigestion.    Marland Kitchen ibuprofen (ADVIL,MOTRIN) 200 MG tablet Take 200 mg by mouth every 6 (six) hours as needed for headache or moderate pain.    Marland Kitchen lansoprazole (PREVACID) 30 MG capsule Take 1 capsule (30 mg total) by mouth daily at 12 noon. (Patient taking differently: Take 30 mg by mouth daily as needed (heartburn). ) 30 capsule 0  . Tetrahydrozoline HCl (EYE DROPS OP) Place 2 drops into both eyes as needed (for red eyes).       . feeding supplement (ENSURE ENLIVE)  237 mL Oral BID BM  . hydrocortisone   Rectal BID  . lactulose  20 g Oral BID  . multivitamin with minerals  1 tablet Oral Daily  . pantoprazole  40 mg Oral Daily  . piperacillin-tazobactam (ZOSYN)  IV  3.375 g Intravenous Q8H  . potassium chloride  40 mEq Oral Once  . sodium chloride flush  3 mL Intravenous Q12H    Infusions:   No Known Allergies  Social History   Social  History  . Marital status: Single    Spouse name: N/A  . Number of children: N/A  . Years of education: N/A   Occupational History  . Not on file.   Social History Main Topics  . Smoking status: Current Every Day Smoker    Packs/day: 1.00    Types: Cigarettes  . Smokeless tobacco: Never Used  . Alcohol use 3.0 oz/week    5 Cans of beer per week  . Drug use:     Types: Marijuana  . Sexual activity: Not on file   Other Topics Concern  . Not on file   Social History Narrative  . No narrative on file    Family History  Problem Relation Age of Onset  . Cirrhosis Mother     alcoholic  . Cirrhosis Father     alcoholic     REVIEW OF SYSTEMS:  All systems reviewed  Negative to the above problem except as noted above.    PHYSICAL EXAM: Vitals:   10/05/16 2050 10/06/16 0434  BP: 131/86 107/84  Pulse: 82 68  Resp: 19 17  Temp: 98.6 F (37 C) 98.5 F (36.9  C)     Intake/Output Summary (Last 24 hours) at 10/06/16 0757 Last data filed at 10/05/16 1825  Gross per 24 hour  Intake             1940 ml  Output              280 ml  Net             1660 ml    General:  Well appearing. No respiratory difficulty HEENT: normal Neck: supple. no JVD. Carotids 2+ bilat; no bruits. No lymphadenopathy or thryomegaly appreciated. Cor: PMI nondisplaced. Regular rate & rhythm. No rubs, gallops or murmurs. Lungs: clear Abdomen: soft, nontender, nondistended. No hepatosplenomegaly. No bruits or masses. Good bowel sounds. Extremities: no cyanosis, clubbing, rash, edema Neuro: alert & oriented x 3, cranial nerves grossly intact. moves all 4 extremities w/o difficulty. Affect pleasant.  ECG:  12/27  ST 103 bpm  LVH  Nonspecific ST T wave changes    Results for orders placed or performed during the hospital encounter of 10/02/16 (from the past 24 hour(s))  Lactic acid, plasma     Status: Abnormal   Collection Time: 10/05/16  1:00 PM  Result Value Ref Range   Lactic Acid, Venous 2.2  (HH) 0.5 - 1.9 mmol/L  Comprehensive metabolic panel     Status: Abnormal   Collection Time: 10/06/16  5:40 AM  Result Value Ref Range   Sodium 133 (L) 135 - 145 mmol/L   Potassium 3.1 (L) 3.5 - 5.1 mmol/L   Chloride 99 (L) 101 - 111 mmol/L   CO2 28 22 - 32 mmol/L   Glucose, Bld 96 65 - 99 mg/dL   BUN <5 (L) 6 - 20 mg/dL   Creatinine, Ser 0.49 (L) 0.61 - 1.24 mg/dL   Calcium 8.4 (L) 8.9 - 10.3 mg/dL   Total Protein 5.1 (L) 6.5 - 8.1 g/dL   Albumin 2.3 (L) 3.5 - 5.0 g/dL   AST 114 (H) 15 - 41 U/L   ALT 111 (H) 17 - 63 U/L   Alkaline Phosphatase 80 38 - 126 U/L   Total Bilirubin 6.3 (H) 0.3 - 1.2 mg/dL   GFR calc non Af Amer >60 >60 mL/min   GFR calc Af Amer >60 >60 mL/min   Anion gap 6 5 - 15  Magnesium     Status: None   Collection Time: 10/06/16  5:40 AM  Result Value Ref Range   Magnesium 2.1 1.7 - 2.4 mg/dL   No results found.   ASSESSMENT: 50 yo who was admitted with GI bleeding, hepatitis   Echo shows normal LVEF but basal hypokinesis of inferior, inferoseptal walls    I have reviewed He denies CP  Breathing is OK  I am not convinced of active cardiac issues  Trop elev earlier prob reflect myocardial demand / strain in setting of acidosis Also, pt is anemic.   I would not plan any further cardiac testing for now .  Pt is not a candidate for any invasive eval with anemia, GI bleeding AND severe thrombocytopenia  I would recheck ferritin  ? Hemachromatosis contrib to above clinical scenerio  From a cardiac standpoint if no active bleeding and hgb stable pt is OK for d/c from cardiac standpoin  Will f/u with results Will make sure pt has f/u in clinic to review results /progress  Pt counselled on tob and EtOH cessation.

## 2016-10-07 LAB — CULTURE, BLOOD (ROUTINE X 2)
Culture: NO GROWTH
Culture: NO GROWTH

## 2016-10-17 ENCOUNTER — Ambulatory Visit: Payer: BLUE CROSS/BLUE SHIELD | Attending: Family Medicine | Admitting: Family Medicine

## 2016-10-17 ENCOUNTER — Encounter: Payer: Self-pay | Admitting: Family Medicine

## 2016-10-17 VITALS — BP 122/73 | HR 90 | Temp 98.1°F | Ht 69.5 in | Wt 125.0 lb

## 2016-10-17 DIAGNOSIS — Z79899 Other long term (current) drug therapy: Secondary | ICD-10-CM | POA: Insufficient documentation

## 2016-10-17 DIAGNOSIS — R51 Headache: Secondary | ICD-10-CM | POA: Diagnosis not present

## 2016-10-17 DIAGNOSIS — F1010 Alcohol abuse, uncomplicated: Secondary | ICD-10-CM | POA: Insufficient documentation

## 2016-10-17 DIAGNOSIS — R740 Nonspecific elevation of levels of transaminase and lactic acid dehydrogenase [LDH]: Secondary | ICD-10-CM | POA: Insufficient documentation

## 2016-10-17 DIAGNOSIS — R519 Headache, unspecified: Secondary | ICD-10-CM

## 2016-10-17 DIAGNOSIS — R7401 Elevation of levels of liver transaminase levels: Secondary | ICD-10-CM

## 2016-10-17 DIAGNOSIS — K922 Gastrointestinal hemorrhage, unspecified: Secondary | ICD-10-CM

## 2016-10-17 DIAGNOSIS — I10 Essential (primary) hypertension: Secondary | ICD-10-CM | POA: Diagnosis not present

## 2016-10-17 DIAGNOSIS — K529 Noninfective gastroenteritis and colitis, unspecified: Secondary | ICD-10-CM

## 2016-10-17 LAB — COMPLETE METABOLIC PANEL WITH GFR
ALT: 31 U/L (ref 9–46)
AST: 39 U/L — ABNORMAL HIGH (ref 10–35)
Albumin: 3.4 g/dL — ABNORMAL LOW (ref 3.6–5.1)
Alkaline Phosphatase: 84 U/L (ref 40–115)
BUN: 8 mg/dL (ref 7–25)
CHLORIDE: 104 mmol/L (ref 98–110)
CO2: 26 mmol/L (ref 20–31)
CREATININE: 0.58 mg/dL — AB (ref 0.70–1.33)
Calcium: 9.5 mg/dL (ref 8.6–10.3)
Glucose, Bld: 89 mg/dL (ref 65–99)
Potassium: 4.5 mmol/L (ref 3.5–5.3)
Sodium: 141 mmol/L (ref 135–146)
Total Bilirubin: 2.6 mg/dL — ABNORMAL HIGH (ref 0.2–1.2)
Total Protein: 6.5 g/dL (ref 6.1–8.1)

## 2016-10-17 LAB — CBC WITH DIFFERENTIAL/PLATELET
BASOS PCT: 1 %
Basophils Absolute: 109 cells/uL (ref 0–200)
EOS ABS: 218 {cells}/uL (ref 15–500)
Eosinophils Relative: 2 %
HEMATOCRIT: 30 % — AB (ref 38.5–50.0)
HEMOGLOBIN: 9.7 g/dL — AB (ref 13.2–17.1)
LYMPHS ABS: 1962 {cells}/uL (ref 850–3900)
Lymphocytes Relative: 18 %
MCH: 32.8 pg (ref 27.0–33.0)
MCHC: 32.3 g/dL (ref 32.0–36.0)
MCV: 101.4 fL — AB (ref 80.0–100.0)
MONO ABS: 1090 {cells}/uL — AB (ref 200–950)
MPV: 8.9 fL (ref 7.5–12.5)
Monocytes Relative: 10 %
NEUTROS ABS: 7521 {cells}/uL (ref 1500–7800)
Neutrophils Relative %: 69 %
Platelets: 520 10*3/uL — ABNORMAL HIGH (ref 140–400)
RBC: 2.96 MIL/uL — ABNORMAL LOW (ref 4.20–5.80)
RDW: 16 % — ABNORMAL HIGH (ref 11.0–15.0)
WBC: 10.9 10*3/uL — ABNORMAL HIGH (ref 3.8–10.8)

## 2016-10-17 MED ORDER — CETIRIZINE HCL 10 MG PO TABS: 10.0000 mg | ORAL_TABLET | Freq: Every day | ORAL | 1 refills | Status: DC

## 2016-10-17 NOTE — Progress Notes (Signed)
Subjective:  Patient ID: Steven Li, male    DOB: February 02, 1966  Age: 51 y.o. MRN: [id number]  CC: Hospitalization Follow-up and Headache (3 weeks - everyday)   HPI KAVIOUS SCHROM is a 51 year old male with a history of alcohol abuse who presents today to establish care and to follow-up from hospitalization from 10/02/16 through 10/06/16 where he was managed for acute upper GI bleed and transaminitis.  He presented with abdominal pain, hematemesis, hematochezia, labs revealed elevated LFTs, anemia with hemoglobin of 7.0, elevated troponins (0.32> 0.21>0.19)  CT abdomen and pelvis revealed diffuse fatty infiltration of the liver. Seen by GI, hepatitis panel was negative, he received packed red blood cells and was placed on a PPI and lactulose with plans for outpatient EGD/colonoscopy when stable. 2-D echo revealed EF of [id number], grade 2 diastolic dysfunction. Elevated troponin thought to be secondary to demand ischemia versus alcohol induced myocarditis. He was subsequently discharged after his condition improved, discharge hemoglobin was 9.1  Today he denies abdominal pain, hematochezia or hematemesis; he quit alcohol 2 weeks ago. He complains of intermittent left-sided headaches above his left eye but denies sinus symptoms, nasal congestion and pain is described as mild.  Past Medical History:  Diagnosis Date  . ETOH abuse   . Hypertension     Past Surgical History:  Procedure Laterality Date  . none      No Known Allergies   Outpatient Medications Prior to Visit  Medication Sig Dispense Refill  . feeding supplement, ENSURE ENLIVE, (ENSURE ENLIVE) LIQD Take 237 mLs by mouth 2 (two) times daily between meals. 237 mL 12  . hydrocortisone (ANUSOL-HC) 2.5 % rectal cream Place rectally 2 (two) times daily. 30 g 0  . Multiple Vitamin (MULTIVITAMIN WITH MINERALS) TABS tablet Take 1 tablet by mouth daily. 30 tablet 0  . pantoprazole (PROTONIX) 40 MG tablet Take 1 tablet (40 mg total) by  mouth daily. 30 tablet 0  . Tetrahydrozoline HCl (EYE DROPS OP) Place 2 drops into both eyes as needed (for red eyes).    . bismuth subsalicylate (PEPTO BISMOL) 262 MG/15ML suspension Take 30 mLs by mouth every 6 (six) hours as needed for indigestion.    Marland Kitchen lactulose (CHRONULAC) 10 GM/15ML solution Take 30 mLs (20 g total) by mouth 2 (two) times daily. (Patient not taking: Reported on 10/17/2016) 240 mL 1   No facility-administered medications prior to visit.     ROS Review of Systems  Constitutional: Negative for activity change and appetite change.  HENT: Negative for sinus pressure and sore throat.   Eyes: Negative for visual disturbance.  Respiratory: Negative for cough, chest tightness and shortness of breath.   Cardiovascular: Negative for chest pain and leg swelling.  Gastrointestinal: Negative for abdominal distention, abdominal pain, constipation and diarrhea.  Endocrine: Negative.   Genitourinary: Negative for dysuria.  Musculoskeletal: Negative for joint swelling and myalgias.  Skin: Negative for rash.  Allergic/Immunologic: Negative.   Neurological: Positive for headaches. Negative for weakness, light-headedness and numbness.  Psychiatric/Behavioral: Negative for dysphoric mood and suicidal ideas.    Objective:  BP 122/73 (BP Location: Right Arm, Patient Position: Sitting, Cuff Size: Small)   Pulse 90   Temp 98.1 F (36.7 C) (Oral)   Ht 5' 9.5" (1.765 m)   Wt 125 lb (56.7 kg)   SpO2 100%   BMI 18.19 kg/m   BP/Weight 10/17/2016 10/06/2016 123456  Systolic BP 123XX123 [id number] -  Diastolic BP 73 84 -  Wt. (Lbs) 125 - 111.11  BMI 18.19 - 16.89      Physical Exam  Constitutional: He is oriented to person, place, and time. He appears well-developed and well-nourished.  Cardiovascular: Normal rate, normal heart sounds and intact distal pulses.   No murmur heard. Pulmonary/Chest: Effort normal and breath sounds normal. He has no wheezes. He has no rales. He exhibits no  tenderness.  Abdominal: Soft. Bowel sounds are normal. He exhibits no distension and no mass. There is no tenderness.  Musculoskeletal: Normal range of motion.  Neurological: He is alert and oriented to person, place, and time.     CMP Latest Ref Rng & Units 10/06/2016 10/05/2016 10/04/2016  Glucose 65 - 99 mg/dL 96 108(H) -  BUN 6 - 20 mg/dL <5(L) 6 -  Creatinine 0.61 - 1.24 mg/dL 0.49(L) <0.30(L) -  Sodium 135 - 145 mmol/L 133(L) 134(L) -  Potassium 3.5 - 5.1 mmol/L 3.1(L) 3.0(L) -  Chloride 101 - 111 mmol/L 99(L) 100(L) -  CO2 22 - 32 mmol/L 28 25 -  Calcium 8.9 - 10.3 mg/dL 8.4(L) 8.2(L) -  Total Protein 6.5 - 8.1 g/dL 5.1(L) 5.0(L) -  Total Bilirubin 0.3 - 1.2 mg/dL 6.3(H) 16.1(H) 30.4(HH)  Alkaline Phos 38 - 126 U/L 80 83 -  AST 15 - 41 U/L 114(H) 179(H) -  ALT 17 - 63 U/L 111(H) 128(H) -    CBC    Component Value Date/Time   WBC 11.9 (H) 10/06/2016 0925   RBC 2.73 (L) 10/06/2016 0925   HGB 9.1 (L) 10/06/2016 0925   HCT 25.8 (L) 10/06/2016 0925   PLT 83 (L) 10/06/2016 0925   MCV 94.5 10/06/2016 0925   MCH 33.3 10/06/2016 0925   MCHC 35.3 10/06/2016 0925   RDW 18.8 (H) 10/06/2016 0925   LYMPHSABS 1.1 10/03/2016 0910   MONOABS 0.9 10/03/2016 0910   EOSABS 0.0 10/03/2016 0910   BASOSABS 0.0 10/03/2016 0910    Assessment & Plan:   1. Transaminitis Alcoholic versus viral Complete metabolic panel to evaluate for improvement - COMPLETE METABOLIC PANEL WITH GFR  2. Acute upper GI bleed Resolved Patient is no longer taking PPI - CBC with Differential/Platelet  3. Colitis Resolved  4. Sinus headache - cetirizine (ZYRTEC) 10 MG tablet; Take 1 tablet (10 mg total) by mouth daily.  Dispense: 30 tablet; Refill: 1  5. Alcohol abuse Alcohol 2 weeks ago-commended   Meds ordered this encounter  Medications  . cetirizine (ZYRTEC) 10 MG tablet    Sig: Take 1 tablet (10 mg total) by mouth daily.    Dispense:  30 tablet    Refill:  1    Follow-up: Return in  about 6 weeks (around 11/28/2016) for Follow-up on transaminitis.   Arnoldo Morale MD

## 2016-10-22 ENCOUNTER — Telehealth: Payer: Self-pay

## 2016-10-22 NOTE — Telephone Encounter (Signed)
Writer discussed lab results with patient per Dr Jarold Song.  Patient stated understanding.

## 2016-10-22 NOTE — Telephone Encounter (Signed)
-----   Message from Arnoldo Morale, MD sent at 10/18/2016 12:16 PM EST ----- Labs reveal improvement in anemia and liver function.

## 2016-10-28 ENCOUNTER — Ambulatory Visit: Payer: BLUE CROSS/BLUE SHIELD | Admitting: Physician Assistant

## 2016-10-28 DIAGNOSIS — Z8719 Personal history of other diseases of the digestive system: Secondary | ICD-10-CM | POA: Insufficient documentation

## 2016-10-28 NOTE — Progress Notes (Deleted)
Cardiology Office Note:    Date:  10/28/2016   ID:  Steven Li, DOB [birth year], MRN [id number]  PCP:  Arnoldo Morale, MD  Cardiologist:  Dr. Dorris Carnes   Electrophysiologist:  n/a  Referring MD: No ref. provider found   No chief complaint on file. ***  History of Present Illness:    Steven Li is a 51 y.o. male with a hx of tobacco and alcohol abuse, HTN.   Admitted 12/27-12/31 with acute hepatitis and blood loss anemia in the setting of hematemesis requiring transfusion with PRBCs (Hgb dropped 13.7>>7).  He had initially presented with abdominal pain on 12/23 but left AMA.  He returned on 12/27.  AST/ALT/TBili was 1657/728/3.9 upon admission.  Acetaminophen level was neg.  Hepatitis serologies, HIV were neg.  Abd CT showed fatty infiltration of the liver.  Echo was obtained and demonstrated EF 50, grade 2 diastolic dysfunction, and hypokinesis of inf, inf-septal and ant-lat walls.  His Troponin level was mildly elevated without clear trend.  He was seen by Cardiology.  Elevated Troponin was thought to be from demand ischemia in the setting of acute illness.  Ferritin was elevated at > 7,500.  Repeat was elevated at 4,018.  No further cardiac workup was pursued given his anemia, thrombocytopenia and recent GI bleed.  OP follow up was recommended.  ***    Prior CV studies that were reviewed today include:    Echo 10/04/16 Inferoseptal, inferior, inferolateral, anterolateral HK, EF 50, grade 2 diastolic dysfunction, mild MR, possible RA mass (most likely infolding of wall/prominent eustachian ridge), PASP 44  Past Medical History:  Diagnosis Date  . ETOH abuse   . Hypertension     Past Surgical History:  Procedure Laterality Date  . none      Current Medications: No outpatient prescriptions have been marked as taking for the 10/28/16 encounter (Appointment) with Liliane Shi, PA-C.     Allergies:   Patient has no known allergies.   Social History   Social History  .  Marital status: Single    Spouse name: N/A  . Number of children: N/A  . Years of education: N/A   Social History Main Topics  . Smoking status: Current Every Day Smoker    Packs/day: 0.50    Types: Cigarettes  . Smokeless tobacco: Never Used  . Alcohol use 3.0 oz/week    5 Cans of beer per week     Comment: last time drank was the 27th of december  . Drug use: Yes    Types: Marijuana     Comment: last time 10/02/16  . Sexual activity: Not on file   Other Topics Concern  . Not on file   Social History Narrative  . No narrative on file     Family History:  The patient's ***family history includes Cirrhosis in his father and mother.   ROS:   Please see the history of present illness.    ROS All other systems reviewed and are negative.   EKGs/Labs/Other Test Reviewed:    EKG:  EKG is *** ordered today.  The ekg ordered today demonstrates ***  Recent Labs: 10/06/2016: Magnesium 2.1 10/17/2016: ALT 31; BUN 8; Creat 0.58; Hemoglobin 9.7; Platelets 520; Potassium 4.5; Sodium 141   Recent Lipid Panel No results found for: CHOL, TRIG, HDL, CHOLHDL, VLDL, LDLCALC, LDLDIRECT   Physical Exam:    VS:  There were no vitals taken for this visit.    Wt Readings from Last 3  Encounters:  10/17/16 125 lb (56.7 kg)  10/03/16 111 lb 1.8 oz (50.4 kg)  09/30/16 130 lb (59 kg)     ***Physical Exam  ASSESSMENT:    1. Elevated troponin   2. Hepatitis   3. History of GI bleed   4. Iron overload   5. Alcohol abuse    PLAN:    In order of problems listed above:  1. Elevated Troponin - Patient was seen by Cardiology in the hospital with elevated Troponin levels and an echo with multiple wall motion abnormalities.  His elevated Troponin was thought to be due to demand ischemia and given his acute GI bleed and thrombocytopenia it was decided that he should not undergo further cardiac testing. *** 2. Hepatitis - ALT at follow up with PCP on 1/11 was 31.  He has follow up with  Gastroenterology pending. *** 3. Hx of GI bleed - He presented with hematemesis and required transfusion with PRBCs.  *** 4. Iron Overload - Ferritin initially was > 7K in the hospital.  *** 5. ETOH abuse - ***   Medication Adjustments/Labs and Tests Ordered: Current medicines are reviewed at length with the patient today.  Concerns regarding medicines are outlined above.  Medication changes, Labs and Tests ordered today are outlined in the Patient Instructions noted below. There are no Patient Instructions on file for this visit. Signed, Richardson Dopp, PA-C  10/28/2016 9:10 AM    [facility] [address], [place], [place]  [address] Phone: [phone]; Fax: [phone]

## 2017-01-13 ENCOUNTER — Telehealth: Payer: Self-pay

## 2017-01-13 NOTE — Telephone Encounter (Signed)
Called to find out if pt would like to have colonoscopy scheduled 

## 2017-04-17 ENCOUNTER — Encounter (HOSPITAL_COMMUNITY): Payer: Self-pay | Admitting: Emergency Medicine

## 2017-04-17 ENCOUNTER — Emergency Department (HOSPITAL_COMMUNITY)
Admission: EM | Admit: 2017-04-17 | Discharge: 2017-04-17 | Disposition: A | Discharge status: Home / Self Care | Payer: BLUE CROSS/BLUE SHIELD | Attending: Emergency Medicine | Admitting: Emergency Medicine

## 2017-04-17 DIAGNOSIS — H11422 Conjunctival edema, left eye: Secondary | ICD-10-CM

## 2017-04-17 DIAGNOSIS — Y939 Activity, unspecified: Secondary | ICD-10-CM | POA: Insufficient documentation

## 2017-04-17 DIAGNOSIS — I10 Essential (primary) hypertension: Secondary | ICD-10-CM | POA: Insufficient documentation

## 2017-04-17 DIAGNOSIS — Y929 Unspecified place or not applicable: Secondary | ICD-10-CM | POA: Insufficient documentation

## 2017-04-17 DIAGNOSIS — S0502XA Injury of conjunctiva and corneal abrasion without foreign body, left eye, initial encounter: Secondary | ICD-10-CM

## 2017-04-17 DIAGNOSIS — F17210 Nicotine dependence, cigarettes, uncomplicated: Secondary | ICD-10-CM | POA: Insufficient documentation

## 2017-04-17 DIAGNOSIS — Z79899 Other long term (current) drug therapy: Secondary | ICD-10-CM | POA: Insufficient documentation

## 2017-04-17 DIAGNOSIS — Y999 Unspecified external cause status: Secondary | ICD-10-CM | POA: Insufficient documentation

## 2017-04-17 DIAGNOSIS — X58XXXA Exposure to other specified factors, initial encounter: Secondary | ICD-10-CM | POA: Insufficient documentation

## 2017-04-17 MED ORDER — TETRACAINE HCL 0.5 % OP SOLN
2.0000 [drp] | Freq: Once | OPHTHALMIC | Status: AC
  Administered 2017-04-17: 2 [drp] via OPHTHALMIC
  Filled 2017-04-17: qty 4

## 2017-04-17 MED ORDER — FLUORESCEIN SODIUM 0.6 MG OP STRP
1.0000 | ORAL_STRIP | Freq: Once | OPHTHALMIC | Status: DC
  Filled 2017-04-17: qty 1

## 2017-04-17 MED ORDER — CIPROFLOXACIN HCL 0.3 % OP SOLN: 1.0000 [drp] | Freq: Four times a day (QID) | OPHTHALMIC | 0 refills | Status: DC

## 2017-04-17 NOTE — Discharge Instructions (Signed)
Read the information below.  Use the prescribed medication as directed.  Please discuss all new medications with your pharmacist.  You may return to the Emergency Department at any time for worsening condition or any new symptoms that concern you.      If you develop worsening pain in your eye, change in your vision, swelling around your eye, difficulty moving your eye, or fevers greater than 100.4, see your eye doctor or return to the Emergency Department immediately for a recheck.    °

## 2017-04-17 NOTE — ED Provider Notes (Signed)
[facility] DEPT Provider Note   CSN: [id number] Arrival date & time: 04/17/17  9628  By signing my name below, I, Sonum Patel, attest that this documentation has been prepared under the direction and in the presence of [facility], PA-C. Electronically Signed: Ludger Nutting, Scribe. 04/17/17. 10:34 AM.  History   Chief Complaint Chief Complaint  Patient presents with  . Eye Pain    The history is provided by the patient. No language interpreter was used.     HPI Comments: Steven Li is a 51 y.o. male who presents to the Emergency Department complaining of continued left eye irritation with associated itching and redness that began this morning. He denies known injury or trauma to the affected area. He does not recall getting any debris in his affected eye. He denies similar symptoms in the past. He does not wear eyeglasses or contacts. He denies blurry or double vision, discharge, right eye symptoms.    Past Medical History:  Diagnosis Date  . ETOH abuse   . Hypertension     Patient Active Problem List   Diagnosis Date Noted  . History of GI bleed 10/28/2016  . Iron overload 10/28/2016  . Hyperbilirubinemia 10/03/2016  . Elevated troponin 10/03/2016  . Anemia   . Abnormal LFTs   . Acute upper GI bleed   . Encephalopathy, hepatic ([place])   . Anemia due to blood loss, acute   . Hepatitis 10/01/2016  . Alcohol abuse 10/01/2016  . Aortic atherosclerosis ([place]) 10/01/2016  . GERD (gastroesophageal reflux disease) 10/01/2016  . Transaminitis 09/30/2016  . Colitis     Past Surgical History:  Procedure Laterality Date  . none         Home Medications    Prior to Admission medications   Medication Sig Start Date End Date Taking? Authorizing Provider  bismuth subsalicylate (PEPTO BISMOL) 262 MG/15ML suspension Take 30 mLs by mouth every 6 (six) hours as needed for indigestion.    [provider]  cetirizine (ZYRTEC) 10 MG tablet Take 1 tablet (10 mg total) by  mouth daily. 10/17/16   Arnoldo Morale, MD  ciprofloxacin (CILOXAN) 0.3 % ophthalmic solution Place 1-2 drops into the left eye 4 (four) times daily. X 5 days 04/17/17   [place], PA-C  feeding supplement, ENSURE ENLIVE, (ENSURE ENLIVE) LIQD Take 237 mLs by mouth 2 (two) times daily between meals. 10/06/16   Hosie Poisson, MD  hydrocortisone (ANUSOL-HC) 2.5 % rectal cream Place rectally 2 (two) times daily. 10/06/16   Hosie Poisson, MD  lactulose (CHRONULAC) 10 GM/15ML solution Take 30 mLs (20 g total) by mouth 2 (two) times daily. Patient not taking: Reported on 10/17/2016 10/06/16   Hosie Poisson, MD  Multiple Vitamin (MULTIVITAMIN WITH MINERALS) TABS tablet Take 1 tablet by mouth daily. 10/07/16   Hosie Poisson, MD  pantoprazole (PROTONIX) 40 MG tablet Take 1 tablet (40 mg total) by mouth daily. 10/07/16   Hosie Poisson, MD  Tetrahydrozoline HCl (EYE DROPS OP) Place 2 drops into both eyes as needed (for red eyes).    [provider]    Family History Family History  Problem Relation Age of Onset  . Cirrhosis Mother        alcoholic  . Cirrhosis Father        alcoholic     Social History Social History  Substance Use Topics  . Smoking status: Current Every Day Smoker    Packs/day: 0.50    Types: Cigarettes  . Smokeless tobacco: Never Used  .  Alcohol use 3.0 oz/week    5 Cans of beer per week     Comment: last time drank was the 27th of december     Allergies   Patient has no known allergies.   Review of Systems Review of Systems  Constitutional: Negative for fever.  HENT: Negative for congestion, facial swelling, rhinorrhea, sinus pain, sinus pressure, sore throat and trouble swallowing.   Eyes: Positive for pain, redness and itching. Negative for discharge and visual disturbance.     Physical Exam Updated Vital Signs BP (!) 157/86 (BP Location: Left Arm)   Pulse 88   Temp 98 F (36.7 C) (Oral)   Resp 18   SpO2 100%   Physical Exam  Constitutional: He  appears well-developed and well-nourished. No distress.  HENT:  Head: Normocephalic and atraumatic.  Eyes: Pupils are equal, round, and reactive to light. EOM are normal. Lids are everted and swept, no foreign bodies found. Left eye exhibits chemosis. Right conjunctiva is not injected. Left conjunctiva is injected.  Left eye injected with chemosis in lower lid. Fluorescein uptake with appearance of mild corneal abrasion over 6 o'clock area of the iris  Neck: Neck supple.  Pulmonary/Chest: Effort normal.  Neurological: He is alert.  Skin: He is not diaphoretic.  Nursing note and vitals reviewed.    ED Treatments / Results  DIAGNOSTIC STUDIES: Oxygen Saturation is 100% on RA, normal by my interpretation.    COORDINATION OF CARE: 10:32 AM Discussed treatment plan with pt at bedside and pt agreed to plan.   Labs (all labs ordered are listed, but only abnormal results are displayed) Labs Reviewed - No data to display  EKG  EKG Interpretation None       Radiology No results found.  Procedures Procedures (including critical care time)  Medications Ordered in ED Medications  fluorescein ophthalmic strip 1 strip (not administered)  tetracaine (PONTOCAINE) 0.5 % ophthalmic solution 2 drop (2 drops Left Eye Given 04/17/17 1109)     Initial Impression / Assessment and Plan / ED Course  I have reviewed the triage vital signs and the nursing notes.  Pertinent labs & imaging results that were available during my care of the patient were reviewed by me and considered in my medical decision making (see chart for details).     Afebrile, nontoxic patient with redness and irritation to left eye only.  No URI symptoms, no known injury or exposure.  Pt does have mild chemosis left lower lid area and may have small abraded area.   D/C home with ciprofloxacin eye drops, eye specialist follow up.  Discussed result, findings, treatment, and follow up  with patient.  Pt given return  precautions.  Pt verbalizes understanding and agrees with plan.       Final Clinical Impressions(s) / ED Diagnoses   Final diagnoses:  Abrasion of left cornea, initial encounter  Chemosis of left conjunctiva    New Prescriptions Discharge Medication List as of 04/17/2017 11:10 AM    START taking these medications   Details  ciprofloxacin (CILOXAN) 0.3 % ophthalmic solution Place 1-2 drops into the left eye 4 (four) times daily. X 5 days, Starting Thu 04/17/2017, Print       I personally performed the services described in this documentation, which was scribed in my presence. The recorded information has been reviewed and is accurate.    Clayton Bibles, PA-C 04/17/17 1411    Margette Fast, MD 04/17/17 1511

## 2017-04-17 NOTE — ED Triage Notes (Signed)
Pt here for redness and itching to left eye upon waking this am

## 2017-08-11 ENCOUNTER — Other Ambulatory Visit: Payer: Self-pay

## 2017-08-11 ENCOUNTER — Emergency Department (HOSPITAL_COMMUNITY)
Admission: EM | Admit: 2017-08-11 | Discharge: 2017-08-11 | Disposition: A | Discharge status: Home / Self Care | Payer: BLUE CROSS/BLUE SHIELD | Attending: Emergency Medicine | Admitting: Emergency Medicine

## 2017-08-11 ENCOUNTER — Encounter (HOSPITAL_COMMUNITY): Payer: Self-pay | Admitting: *Deleted

## 2017-08-11 DIAGNOSIS — F10288 Alcohol dependence with other alcohol-induced disorder: Secondary | ICD-10-CM | POA: Insufficient documentation

## 2017-08-11 DIAGNOSIS — I10 Essential (primary) hypertension: Secondary | ICD-10-CM | POA: Insufficient documentation

## 2017-08-11 DIAGNOSIS — R1084 Generalized abdominal pain: Secondary | ICD-10-CM

## 2017-08-11 DIAGNOSIS — F17210 Nicotine dependence, cigarettes, uncomplicated: Secondary | ICD-10-CM | POA: Insufficient documentation

## 2017-08-11 DIAGNOSIS — K2920 Alcoholic gastritis without bleeding: Secondary | ICD-10-CM | POA: Insufficient documentation

## 2017-08-11 DIAGNOSIS — K7010 Alcoholic hepatitis without ascites: Secondary | ICD-10-CM | POA: Insufficient documentation

## 2017-08-11 LAB — COMPREHENSIVE METABOLIC PANEL
ALBUMIN: 3.9 g/dL (ref 3.5–5.0)
ALT: 252 U/L — AB (ref 17–63)
AST: 428 U/L — AB (ref 15–41)
Alkaline Phosphatase: 127 U/L — ABNORMAL HIGH (ref 38–126)
Anion gap: 19 — ABNORMAL HIGH (ref 5–15)
BUN: 14 mg/dL (ref 6–20)
CHLORIDE: 101 mmol/L (ref 101–111)
CO2: 15 mmol/L — AB (ref 22–32)
CREATININE: 0.97 mg/dL (ref 0.61–1.24)
Calcium: 9.1 mg/dL (ref 8.9–10.3)
GFR calc non Af Amer: >60 mL/min (ref >60)
Glucose, Bld: 99 mg/dL (ref 65–99)
Potassium: 4.1 mmol/L (ref 3.5–5.1)
SODIUM: 135 mmol/L (ref 135–145)
Total Bilirubin: 1.4 mg/dL — ABNORMAL HIGH (ref 0.3–1.2)
Total Protein: 7.2 g/dL (ref 6.5–8.1)

## 2017-08-11 LAB — CBC
HCT: 40.4 % (ref 39.0–52.0)
Hemoglobin: 14.8 g/dL (ref 13.0–17.0)
MCH: 33.8 pg (ref 26.0–34.0)
MCHC: 36.6 g/dL — ABNORMAL HIGH (ref 30.0–36.0)
MCV: 92.2 fL (ref 78.0–100.0)
PLATELETS: 274 10*3/uL (ref 150–400)
RBC: 4.38 MIL/uL (ref 4.22–5.81)
RDW: 13 % (ref 11.5–15.5)
WBC: 6.2 10*3/uL (ref 4.0–10.5)

## 2017-08-11 LAB — URINALYSIS, ROUTINE W REFLEX MICROSCOPIC
BILIRUBIN URINE: NEGATIVE
Bacteria, UA: NONE SEEN
GLUCOSE, UA: NEGATIVE mg/dL
Hgb urine dipstick: NEGATIVE
Ketones, ur: NEGATIVE mg/dL
Leukocytes, UA: NEGATIVE
Nitrite: NEGATIVE
Protein, ur: 30 mg/dL — AB
SPECIFIC GRAVITY, URINE: 1.015 (ref 1.005–1.030)
pH: 5 (ref 5.0–8.0)

## 2017-08-11 LAB — POC OCCULT BLOOD, ED: Fecal Occult Bld: NEGATIVE

## 2017-08-11 LAB — LIPASE, BLOOD: LIPASE: 21 U/L (ref 11–51)

## 2017-08-11 MED ORDER — SUCRALFATE 1 GM/10ML PO SUSP: 1.0000 g | Freq: Three times a day (TID) | ORAL | 0 refills | Status: DC

## 2017-08-11 MED ORDER — PANTOPRAZOLE SODIUM 20 MG PO TBEC: 20.0000 mg | DELAYED_RELEASE_TABLET | Freq: Two times a day (BID) | ORAL | 0 refills | Status: DC

## 2017-08-11 MED ORDER — CHLORDIAZEPOXIDE HCL 25 MG PO CAPS: ORAL_CAPSULE | ORAL | 0 refills | Status: DC

## 2017-08-11 NOTE — ED Triage Notes (Signed)
The pt is c/o abd pain for 2 weeks with n v and diarrhea.  He has had the same in the past

## 2017-08-11 NOTE — ED Provider Notes (Signed)
[facility] EMERGENCY DEPARTMENT Provider Note   CSN: [id number] Arrival date & time: 08/11/17  0310     History   Chief Complaint Chief Complaint  Patient presents with  . Abdominal Pain    HPI Steven Li is a 51 y.o. male.  Patient presents to the emergency department for evaluation of abdominal pain.  Patient reports that he has a history of similar pain.  He reports the last time he had a he was in hospital for several days.  Patient denies any nausea, vomiting, diarrhea, constipation.  He has not noticed any melena, hematochezia, hematemesis.  Pain is constant, no alleviating or exacerbating factors identified.      Past Medical History:  Diagnosis Date  . ETOH abuse   . Hypertension     Patient Active Problem List   Diagnosis Date Noted  . History of GI bleed 10/28/2016  . Iron overload 10/28/2016  . Hyperbilirubinemia 10/03/2016  . Elevated troponin 10/03/2016  . Anemia   . Abnormal LFTs   . Acute upper GI bleed   . Encephalopathy, hepatic ([place])   . Anemia due to blood loss, acute   . Hepatitis 10/01/2016  . Alcohol abuse 10/01/2016  . Aortic atherosclerosis ([place]) 10/01/2016  . GERD (gastroesophageal reflux disease) 10/01/2016  . Transaminitis 09/30/2016  . Colitis     Past Surgical History:  Procedure Laterality Date  . none         Home Medications    Prior to Admission medications   Medication Sig Start Date End Date Taking? Authorizing Provider  bismuth subsalicylate (PEPTO BISMOL) 262 MG/15ML suspension Take 30 mLs by mouth every 6 (six) hours as needed for indigestion.   Yes [provider]  chlordiazePOXIDE (LIBRIUM) 25 MG capsule 50mg  PO TID x 1D, then 25-50mg  PO BID X 1D, then 25-50mg  PO QD X 1D 08/11/17   Keyshla Tunison, Gwenyth Allegra, MD  pantoprazole (PROTONIX) 20 MG tablet Take 1 tablet (20 mg total) 2 (two) times daily by mouth. 08/11/17   Orpah Greek, MD  sucralfate (CARAFATE) 1 GM/10ML suspension  Take 10 mLs (1 g total) 4 (four) times daily -  with meals and at bedtime by mouth. 08/11/17   Orpah Greek, MD    Family History Family History  Problem Relation Age of Onset  . Cirrhosis Mother        alcoholic  . Cirrhosis Father        alcoholic     Social History Social History   Tobacco Use  . Smoking status: Current Every Day Smoker    Packs/day: 0.50    Types: Cigarettes  . Smokeless tobacco: Never Used  Substance Use Topics  . Alcohol use: Yes    Alcohol/week: 3.0 oz    Types: 5 Cans of beer per week    Comment: last time drank was the 27th of december  . Drug use: Yes    Types: Marijuana    Comment: last time 10/02/16     Allergies   Patient has no known allergies.   Review of Systems Review of Systems  Gastrointestinal: Positive for abdominal pain.  All other systems reviewed and are negative.    Physical Exam Updated Vital Signs BP (!) 135/99   Pulse (!) 114   Temp 98.7 F (37.1 C)   Resp 20   Ht 5\' 8"  (1.727 m)   Wt 59 kg (130 lb)   SpO2 100%   BMI 19.77 kg/m   Physical  Exam  Constitutional: He is oriented to person, place, and time. He appears well-developed and well-nourished. No distress.  HENT:  Head: Normocephalic and atraumatic.  Right Ear: Hearing normal.  Left Ear: Hearing normal.  Nose: Nose normal.  Mouth/Throat: Oropharynx is clear and moist and mucous membranes are normal.  Eyes: Conjunctivae and EOM are normal. Pupils are equal, round, and reactive to light.  Neck: Normal range of motion. Neck supple.  Cardiovascular: Regular rhythm, S1 normal and S2 normal. Exam reveals no gallop and no friction rub.  No murmur heard. Pulmonary/Chest: Effort normal and breath sounds normal. No respiratory distress. He exhibits no tenderness.  Abdominal: Soft. Normal appearance and bowel sounds are normal. There is no hepatosplenomegaly. There is tenderness in the epigastric area. There is no rebound, no guarding, no tenderness at  McBurney's point and negative Murphy's sign. No hernia.  Musculoskeletal: Normal range of motion.  Neurological: He is alert and oriented to person, place, and time. He has normal strength. No cranial nerve deficit or sensory deficit. Coordination normal. GCS eye subscore is 4. GCS verbal subscore is 5. GCS motor subscore is 6.  Skin: Skin is warm, dry and intact. No rash noted. No cyanosis.  Psychiatric: He has a normal mood and affect. His speech is normal and behavior is normal. Thought content normal.  Nursing note and vitals reviewed.    ED Treatments / Results  Labs (all labs ordered are listed, but only abnormal results are displayed) Labs Reviewed  COMPREHENSIVE METABOLIC PANEL - Abnormal; Notable for the following components:      Result Value   CO2 15 (*)    AST 428 (*)    ALT 252 (*)    Alkaline Phosphatase 127 (*)    Total Bilirubin 1.4 (*)    Anion gap 19 (*)    All other components within normal limits  CBC - Abnormal; Notable for the following components:   MCHC 36.6 (*)    All other components within normal limits  URINALYSIS, ROUTINE W REFLEX MICROSCOPIC - Abnormal; Notable for the following components:   APPearance HAZY (*)    Protein, ur 30 (*)    Squamous Epithelial / LPF 0-5 (*)    All other components within normal limits  LIPASE, BLOOD  HEPATITIS PANEL, ACUTE  POC OCCULT BLOOD, ED    EKG  EKG Interpretation None       Radiology No results found.  Procedures Procedures (including critical care time)  Medications Ordered in ED Medications - No data to display   Initial Impression / Assessment and Plan / ED Course  I have reviewed the triage vital signs and the nursing notes.  Pertinent labs & imaging results that were available during my care of the patient were reviewed by me and considered in my medical decision making (see chart for details).     Patient presents to the emergency department for evaluation of abdominal pain.  He  reports similar pains in the past.  Review of his medical records reveals that he was hospitalized one year ago with alcoholic hepatitis and acute GI bleed.  Patient never followed up with GI after he left the hospital.  He continues to drink daily.  He has not noticed any bleeding.  Rectal exam was heme-negative.  He is not anemic today.  He does have elevated LFTs.  This is likely secondary to his chronic alcohol intake.  Will send acute hepatitis panel, but suspect that this is been somewhat chronic for this  patient.  Abdominal exam is benign.  He is an extremely thin person, I do not suspect that there is any ascites.  He does not have any sign of peritonitis.  Lab work is unremarkable other than his LFTs.  Patient was counseled that he needs to stop drinking.  He will be given resources for help with his alcohol abuse.  Will place him on Protonix and Carafate.  Patient given prescription for Librium taper.  He will be referred to gastroenterology for further evaluation.  Patient counseled to return to the ER for any rectal bleeding, hematemesis, increasing abdominal pain or fever.  Final Clinical Impressions(s) / ED Diagnoses   Final diagnoses:  Alcoholic hepatitis without ascites  Generalized abdominal pain  Acute alcoholic gastritis without hemorrhage    ED Discharge Orders        Ordered    pantoprazole (PROTONIX) 20 MG tablet  2 times daily     08/11/17 0516    sucralfate (CARAFATE) 1 GM/10ML suspension  3 times daily with meals & bedtime     08/11/17 0516    chlordiazePOXIDE (LIBRIUM) 25 MG capsule     08/11/17 0516       Orpah Greek, MD 08/11/17 [phone]

## 2017-08-12 LAB — HEPATITIS PANEL, ACUTE
HEP B C IGM: NEGATIVE
HEP B S AG: NEGATIVE
Hep A IgM: NEGATIVE

## 2018-04-17 ENCOUNTER — Emergency Department (HOSPITAL_COMMUNITY): Payer: Self-pay

## 2018-04-17 ENCOUNTER — Other Ambulatory Visit: Payer: Self-pay

## 2018-04-17 ENCOUNTER — Encounter (HOSPITAL_COMMUNITY): Payer: Self-pay | Admitting: *Deleted

## 2018-04-17 ENCOUNTER — Inpatient Hospital Stay (HOSPITAL_COMMUNITY)
Admission: EM | Admit: 2018-04-17 | Discharge: 2018-04-18 | DRG: 439 | Disposition: A | Discharge status: Home / Self Care | Payer: Self-pay | Attending: Internal Medicine | Admitting: Internal Medicine

## 2018-04-17 DIAGNOSIS — F17210 Nicotine dependence, cigarettes, uncomplicated: Secondary | ICD-10-CM | POA: Diagnosis present

## 2018-04-17 DIAGNOSIS — F10229 Alcohol dependence with intoxication, unspecified: Secondary | ICD-10-CM | POA: Diagnosis present

## 2018-04-17 DIAGNOSIS — Z720 Tobacco use: Secondary | ICD-10-CM | POA: Diagnosis present

## 2018-04-17 DIAGNOSIS — R748 Abnormal levels of other serum enzymes: Secondary | ICD-10-CM | POA: Diagnosis present

## 2018-04-17 DIAGNOSIS — F1010 Alcohol abuse, uncomplicated: Secondary | ICD-10-CM | POA: Diagnosis present

## 2018-04-17 DIAGNOSIS — K852 Alcohol induced acute pancreatitis without necrosis or infection: Principal | ICD-10-CM

## 2018-04-17 DIAGNOSIS — Z923 Personal history of irradiation: Secondary | ICD-10-CM

## 2018-04-17 DIAGNOSIS — K863 Pseudocyst of pancreas: Secondary | ICD-10-CM | POA: Diagnosis present

## 2018-04-17 DIAGNOSIS — C169 Malignant neoplasm of stomach, unspecified: Secondary | ICD-10-CM | POA: Diagnosis present

## 2018-04-17 DIAGNOSIS — Z8679 Personal history of other diseases of the circulatory system: Secondary | ICD-10-CM

## 2018-04-17 DIAGNOSIS — D696 Thrombocytopenia, unspecified: Secondary | ICD-10-CM | POA: Diagnosis present

## 2018-04-17 DIAGNOSIS — Z23 Encounter for immunization: Secondary | ICD-10-CM

## 2018-04-17 DIAGNOSIS — K219 Gastro-esophageal reflux disease without esophagitis: Secondary | ICD-10-CM | POA: Diagnosis present

## 2018-04-17 HISTORY — DX: Pure hypercholesterolemia, unspecified: E78.00

## 2018-04-17 HISTORY — DX: Headache, unspecified: R51.9

## 2018-04-17 HISTORY — DX: Migraine, unspecified, not intractable, without status migrainosus: G43.909

## 2018-04-17 HISTORY — DX: Headache: R51

## 2018-04-17 LAB — URINALYSIS, ROUTINE W REFLEX MICROSCOPIC
Bilirubin Urine: NEGATIVE
Glucose, UA: NEGATIVE mg/dL
Hgb urine dipstick: NEGATIVE
KETONES UR: NEGATIVE mg/dL
LEUKOCYTES UA: NEGATIVE
NITRITE: NEGATIVE
PH: 5 (ref 5.0–8.0)
Protein, ur: NEGATIVE mg/dL
SPECIFIC GRAVITY, URINE: 1.008 (ref 1.005–1.030)

## 2018-04-17 LAB — CREATININE, SERUM
Creatinine, Ser: 1.02 mg/dL (ref 0.61–1.24)
GFR calc Af Amer: >60 mL/min (ref >60)
GFR calc non Af Amer: >60 mL/min (ref >60)

## 2018-04-17 LAB — CBC
HCT: 41.8 % (ref 39.0–52.0)
HEMATOCRIT: 41.5 % (ref 39.0–52.0)
HEMOGLOBIN: 14.6 g/dL (ref 13.0–17.0)
HEMOGLOBIN: 14.4 g/dL (ref 13.0–17.0)
MCH: 32.3 pg (ref 26.0–34.0)
MCH: 32.1 pg (ref 26.0–34.0)
MCHC: 34.9 g/dL (ref 30.0–36.0)
MCHC: 34.7 g/dL (ref 30.0–36.0)
MCV: 92.5 fL (ref 78.0–100.0)
MCV: 92.4 fL (ref 78.0–100.0)
Platelets: 188 10*3/uL (ref 150–400)
Platelets: 173 10*3/uL (ref 150–400)
RBC: 4.52 MIL/uL (ref 4.22–5.81)
RBC: 4.49 MIL/uL (ref 4.22–5.81)
RDW: 12.3 % (ref 11.5–15.5)
RDW: 12.3 % (ref 11.5–15.5)
WBC: 8.2 10*3/uL (ref 4.0–10.5)
WBC: 8.7 10*3/uL (ref 4.0–10.5)

## 2018-04-17 LAB — COMPREHENSIVE METABOLIC PANEL
ALT: 28 U/L (ref 0–44)
ANION GAP: 16 — AB (ref 5–15)
AST: 63 U/L — AB (ref 15–41)
Albumin: 4 g/dL (ref 3.5–5.0)
Alkaline Phosphatase: 90 U/L (ref 38–126)
BUN: 8 mg/dL (ref 6–20)
CALCIUM: 9.4 mg/dL (ref 8.9–10.3)
CO2: 20 mmol/L — ABNORMAL LOW (ref 22–32)
Chloride: 100 mmol/L (ref 98–111)
Creatinine, Ser: 1.02 mg/dL (ref 0.61–1.24)
GFR calc non Af Amer: >60 mL/min (ref >60)
GLUCOSE: 110 mg/dL — AB (ref 70–99)
Potassium: 4 mmol/L (ref 3.5–5.1)
Sodium: 136 mmol/L (ref 135–145)
TOTAL PROTEIN: 7 g/dL (ref 6.5–8.1)
Total Bilirubin: 0.8 mg/dL (ref 0.3–1.2)

## 2018-04-17 LAB — ETHANOL: Alcohol, Ethyl (B): 116 mg/dL — ABNORMAL HIGH (ref <10)

## 2018-04-17 LAB — TYPE AND SCREEN
ABO/RH(D): A POS
ANTIBODY SCREEN: NEGATIVE

## 2018-04-17 LAB — LIPASE, BLOOD: LIPASE: 291 U/L — AB (ref 11–51)

## 2018-04-17 LAB — ABO/RH: ABO/RH(D): A POS

## 2018-04-17 MED ORDER — LORAZEPAM 1 MG PO TABS: 1.0000 mg | ORAL_TABLET | Freq: Four times a day (QID) | ORAL | Status: DC | PRN

## 2018-04-17 MED ORDER — SODIUM CHLORIDE 0.9 % IV SOLN
Freq: Once | INTRAVENOUS | Status: AC
  Administered 2018-04-17: 12:00:00 via INTRAVENOUS

## 2018-04-17 MED ORDER — HYDRALAZINE HCL 20 MG/ML IJ SOLN
5.0000 mg | Freq: Four times a day (QID) | INTRAMUSCULAR | Status: DC | PRN
  Administered 2018-04-17: 5 mg via INTRAVENOUS
  Filled 2018-04-17: qty 1

## 2018-04-17 MED ORDER — SODIUM CHLORIDE 0.9% FLUSH
3.0000 mL | Freq: Two times a day (BID) | INTRAVENOUS | Status: DC
  Administered 2018-04-17 – 2018-04-18 (×2): 3 mL via INTRAVENOUS

## 2018-04-17 MED ORDER — SODIUM CHLORIDE 0.9 % IV BOLUS
1000.0000 mL | Freq: Once | INTRAVENOUS | Status: AC
Start: 2018-04-17 — End: 2018-04-17
  Administered 2018-04-17: 1000 mL via INTRAVENOUS

## 2018-04-17 MED ORDER — ENOXAPARIN SODIUM 40 MG/0.4ML ~~LOC~~ SOLN
40.0000 mg | SUBCUTANEOUS | Status: DC
  Administered 2018-04-17: 40 mg via SUBCUTANEOUS
  Filled 2018-04-17 (×2): qty 0.4

## 2018-04-17 MED ORDER — ADULT MULTIVITAMIN W/MINERALS CH
1.0000 | ORAL_TABLET | Freq: Every day | ORAL | Status: DC
Start: 2018-04-22 — End: 2018-04-18

## 2018-04-17 MED ORDER — ONDANSETRON HCL 4 MG/2ML IJ SOLN
4.0000 mg | Freq: Once | INTRAMUSCULAR | Status: AC
  Administered 2018-04-17: 4 mg via INTRAVENOUS
  Filled 2018-04-17: qty 2

## 2018-04-17 MED ORDER — VITAMIN B-1 100 MG PO TABS: 100.0000 mg | ORAL_TABLET | Freq: Every day | ORAL | Status: DC

## 2018-04-17 MED ORDER — PANTOPRAZOLE SODIUM 40 MG PO TBEC
40.0000 mg | DELAYED_RELEASE_TABLET | Freq: Two times a day (BID) | ORAL | Status: DC
  Administered 2018-04-17 – 2018-04-18 (×3): 40 mg via ORAL
  Filled 2018-04-17 (×3): qty 1

## 2018-04-17 MED ORDER — NAPHAZOLINE-GLYCERIN 0.012-0.2 % OP SOLN
2.0000 [drp] | Freq: Four times a day (QID) | OPHTHALMIC | Status: DC | PRN
  Filled 2018-04-17: qty 15

## 2018-04-17 MED ORDER — THIAMINE HCL 100 MG/ML IJ SOLN
Freq: Every day | INTRAVENOUS | Status: DC
  Administered 2018-04-17: 14:00:00 via INTRAVENOUS
  Filled 2018-04-17 (×3): qty 1000

## 2018-04-17 MED ORDER — THIAMINE HCL 100 MG/ML IJ SOLN: 100.0000 mg | Freq: Every day | INTRAMUSCULAR | Status: DC

## 2018-04-17 MED ORDER — PNEUMOCOCCAL VAC POLYVALENT 25 MCG/0.5ML IJ INJ
0.5000 mL | INJECTION | INTRAMUSCULAR | Status: AC
  Administered 2018-04-18: 0.5 mL via INTRAMUSCULAR
  Filled 2018-04-17: qty 0.5

## 2018-04-17 MED ORDER — FOLIC ACID 1 MG PO TABS: 1.0000 mg | ORAL_TABLET | Freq: Every day | ORAL | Status: DC

## 2018-04-17 MED ORDER — LORAZEPAM 2 MG/ML IJ SOLN
1.0000 mg | Freq: Four times a day (QID) | INTRAMUSCULAR | Status: DC | PRN
  Administered 2018-04-18: 1 mg via INTRAVENOUS

## 2018-04-17 MED ORDER — KETOROLAC TROMETHAMINE 30 MG/ML IJ SOLN
30.0000 mg | Freq: Four times a day (QID) | INTRAMUSCULAR | Status: DC | PRN
  Administered 2018-04-17 – 2018-04-18 (×3): 30 mg via INTRAVENOUS
  Filled 2018-04-17 (×3): qty 1

## 2018-04-17 MED ORDER — MORPHINE SULFATE (PF) 4 MG/ML IV SOLN
4.0000 mg | Freq: Once | INTRAVENOUS | Status: AC
  Administered 2018-04-17: 4 mg via INTRAVENOUS
  Filled 2018-04-17: qty 1

## 2018-04-17 MED ORDER — IOHEXOL 300 MG/ML  SOLN
100.0000 mL | Freq: Once | INTRAMUSCULAR | Status: AC | PRN
  Administered 2018-04-17: 100 mL via INTRAVENOUS

## 2018-04-17 MED ORDER — HYDROMORPHONE HCL 1 MG/ML IJ SOLN
1.0000 mg | Freq: Once | INTRAMUSCULAR | Status: AC
  Administered 2018-04-17: 1 mg via INTRAVENOUS
  Filled 2018-04-17: qty 1

## 2018-04-17 MED ORDER — LORAZEPAM 2 MG/ML IJ SOLN
0.0000 mg | Freq: Four times a day (QID) | INTRAMUSCULAR | Status: DC
  Administered 2018-04-17: 1 mg via INTRAVENOUS
  Administered 2018-04-18 (×2): 2 mg via INTRAVENOUS
  Filled 2018-04-17 (×6): qty 1

## 2018-04-17 MED ORDER — NICOTINE 21 MG/24HR TD PT24
21.0000 mg | MEDICATED_PATCH | Freq: Every day | TRANSDERMAL | Status: DC
  Administered 2018-04-17 – 2018-04-18 (×2): 21 mg via TRANSDERMAL
  Filled 2018-04-17 (×2): qty 1

## 2018-04-17 MED ORDER — LORAZEPAM 2 MG/ML IJ SOLN: 0.0000 mg | Freq: Two times a day (BID) | INTRAMUSCULAR | Status: DC

## 2018-04-17 MED ORDER — MORPHINE SULFATE (PF) 4 MG/ML IV SOLN
2.0000 mg | INTRAVENOUS | Status: DC | PRN
  Administered 2018-04-17 – 2018-04-18 (×3): 2 mg via INTRAVENOUS
  Filled 2018-04-17 (×4): qty 1

## 2018-04-17 MED ORDER — POTASSIUM CHLORIDE IN NACL 20-0.9 MEQ/L-% IV SOLN
INTRAVENOUS | Status: DC
  Administered 2018-04-17 – 2018-04-18 (×3): via INTRAVENOUS
  Filled 2018-04-17 (×4): qty 1000

## 2018-04-17 NOTE — ED Notes (Signed)
Admitting MD at bedside.

## 2018-04-17 NOTE — H&P (Signed)
History and Physical    Steven Li DDU:[id number] DOB: January 31, 1966 DOA: 04/17/2018  PCP: Charlott Rakes, MD  Patient coming from: Home  I have personally briefly reviewed patient's old medical records in [place]  Chief Complaint: Domino pain which began approximately 24 hours ago  HPI: Steven Li is a 52 y.o. male with medical history significant of it and alcohol abuse who developed abdominal pain approximately 24 hours ago.  Reports drinking alcohol heavily yesterday and he states that his entire abdomen started to hurt last night.  He is complaining of associated nausea and vomiting.  He has had similar prior episode but no prior diagnosis of pancreatitis.  He does not take any medications at home for symptoms.  He is clinically intoxicated at the time of my evaluation.  The abdominal pain is a recurrent problem it started 12 to 24 hours ago.  It occurs every several days.  It has not changed since it began yesterday.  Its associated with alcohol use.  Its located in the generalized abdominal region but most severely in the epigastric area.  The pain is moderate to severe.  His symptoms include nausea and vomiting.  Pertinent negatives include fever.  Nothing makes the symptoms better he has not eaten so he does not know if that makes them worse.  It appears that nothing makes them worse.  In the emergency department he was evaluated with a CT scan which revealed a possible pseudocyst in the service sac.  Patient was started on IV fluids and given pain medication in the emergency department he is n.p.o.  He referred to me for further evaluation and management.  Interestingly: Patient reports a history of stomach cancer and that he gets radiation here locally.  He says it is down the street from here.  He cannot name a physician, he cannot name a place where he gets his radiation.  He says he has papers at home.  Also we have absolutely no records in care everywhere or in our system  that reflect any information about his receiving radiation for a stomach cancer.  I requested that the patient bring in whatever records he has.  Review of Systems: As per HPI otherwise all other systems reviewed and  negative.    Past Medical History:  Diagnosis Date  . ETOH abuse   . Hypertension     Past Surgical History:  Procedure Laterality Date  . none      Social History   Social History Narrative  . Not on file     reports that he has been smoking cigarettes.  He has been smoking about 0.50 packs per day. He has never used smokeless tobacco. He reports that he drinks about 3.0 oz of alcohol per week. He reports that he has current or past drug history. Drug: Marijuana.  No Known Allergies  Family History  Problem Relation Age of Onset  . Cirrhosis Mother        alcoholic  . Stomach cancer Mother   . Cirrhosis Father        alcoholic      Prior to Admission medications   Medication Sig Start Date End Date Taking? Authorizing Provider  b complex vitamins tablet Take 1 tablet by mouth daily.   Yes [provider]  tetrahydrozoline 0.05 % ophthalmic solution Place 2 drops into both eyes daily.   Yes [provider]  vitamin C (ASCORBIC ACID) 500 MG tablet Take 500 mg by mouth  daily.   Yes [provider]  chlordiazePOXIDE (LIBRIUM) 25 MG capsule 50mg  PO TID x 1D, then 25-50mg  PO BID X 1D, then 25-50mg  PO QD X 1D Patient not taking: Reported on 04/17/2018 08/11/17   Orpah Greek, MD  pantoprazole (PROTONIX) 20 MG tablet Take 1 tablet (20 mg total) 2 (two) times daily by mouth. Patient not taking: Reported on 04/17/2018 08/11/17   Orpah Greek, MD  sucralfate (CARAFATE) 1 GM/10ML suspension Take 10 mLs (1 g total) 4 (four) times daily -  with meals and at bedtime by mouth. Patient not taking: Reported on 04/17/2018 08/11/17   Orpah Greek, MD    Physical Exam:  Constitutional: NAD, calm, comfortable Vitals:    04/17/18 0930 04/17/18 0945 04/17/18 1000 04/17/18 1135  BP: (!) 162/105 (!) 152/103 (!) 160/101 (!) 172/110  Pulse: (!) 105 95 84 99  Resp:      Temp:      TempSrc:      SpO2: 97% 95% 95%    Eyes: PERRL, lids and conjunctivae normal ENMT: Mucous membranes are moist. Posterior pharynx clear of any exudate or lesions.Normal dentition.  Neck: normal, supple, no masses, no thyromegaly Respiratory: clear to auscultation bilaterally, no wheezing, no crackles. Normal respiratory effort. No accessory muscle use.  Cardiovascular: Regular rate and rhythm, no murmurs / rubs / gallops. No extremity edema. 2+ pedal pulses. No carotid bruits.  Abdomen: Exquisitely tender to palpation fairly soft no bowel sounds appreciated, nondistended no hepatosplenomegaly. Musculoskeletal: no clubbing / cyanosis. No joint deformity upper and lower extremities. Good ROM, no contractures. Normal muscle tone.  Skin: no rashes, lesions, ulcers. No induration Neurologic: CN 2-12 grossly intact. Sensation intact, DTR normal. Strength 5/5 in all 4.  Psychiatric: Normal judgment and insight. Alert and oriented x 3. Normal mood.    Labs on Admission: I have personally reviewed following labs and imaging studies  CBC: Recent Labs  Lab 04/17/18 0809  WBC 8.2  HGB 14.6  HCT 41.8  MCV 92.5  PLT 086   Basic Metabolic Panel: Recent Labs  Lab 04/17/18 0809  NA 136  K 4.0  CL 100  CO2 20*  GLUCOSE 110*  BUN 8  CREATININE 1.02  CALCIUM 9.4   GFR: CrCl cannot be calculated (Unknown ideal weight.). Liver Function Tests: Recent Labs  Lab 04/17/18 0809  AST 63*  ALT 28  ALKPHOS 90  BILITOT 0.8  PROT 7.0  ALBUMIN 4.0   Recent Labs  Lab 04/17/18 0809  LIPASE 291*   Urine analysis:    Component Value Date/Time   COLORURINE YELLOW 04/17/2018 [facility] 04/17/2018 0949   LABSPEC 1.008 04/17/2018 0949   PHURINE 5.0 04/17/2018 0949   GLUCOSEU NEGATIVE 04/17/2018 0949   HGBUR NEGATIVE  04/17/2018 0949   BILIRUBINUR NEGATIVE 04/17/2018 0949   KETONESUR NEGATIVE 04/17/2018 0949   PROTEINUR NEGATIVE 04/17/2018 0949   UROBILINOGEN 0.2 07/07/2014 0755   NITRITE NEGATIVE 04/17/2018 0949   LEUKOCYTESUR NEGATIVE 04/17/2018 0949    Radiological Exams on Admission: Ct Abdomen Pelvis W Contrast  Result Date: 04/17/2018 CLINICAL DATA:  Abdominal pain started last night. Nausea, vomiting. EXAM: CT ABDOMEN AND PELVIS WITH CONTRAST TECHNIQUE: Multidetector CT imaging of the abdomen and pelvis was performed using the standard protocol following bolus administration of intravenous contrast. CONTRAST:  169mL OMNIPAQUE IOHEXOL 300 MG/ML  SOLN COMPARISON:  None. FINDINGS: Lower chest: No acute abnormality. Hepatobiliary: No focal liver abnormality is seen. Diffuse low attenuation of the liver  as can be seen with hepatic steatosis. No gallstones, gallbladder wall thickening, or biliary dilatation. Pancreas: Peripancreatic inflammatory changes around the mid body of the pancreas. 2.1 x 4.2 cm fluid collection in the lesser sac. Mild pancreatic ductal dilatation without a obstructing lesion. Pancreas enhances normally and homogeneously without areas of necrosis. Spleen: Normal in size without focal abnormality. Adrenals/Urinary Tract: Adrenal glands are unremarkable. Kidneys are normal, without renal calculi, focal lesion, or hydronephrosis. Bladder is unremarkable. Stomach/Bowel: Stomach is within normal limits. Appendix appears normal. No evidence of bowel wall thickening, distention, or inflammatory changes. Vascular/Lymphatic: Aortic atherosclerosis. No enlarged abdominal or pelvic lymph nodes. Reproductive: Uterus and bilateral adnexa are unremarkable. Other: No abdominal wall hernia or abnormality. No abdominopelvic ascites. Musculoskeletal: No acute or significant osseous findings. IMPRESSION: 1. Findings consistent with acute pancreatitis without pancreatic necrosis. 2.1 x 4.2 cm fluid collection in  the lesser sac which may reflect a developing pseudocyst. 2. Hepatic steatosis. Electronically Signed   By: Kathreen Devoid   On: 04/17/2018 10:30    Assessment/Plan Active Problems:   Pancreatitis, alcoholic, acute   Pancreatic pseudocyst   Alcohol abuse   GERD (gastroesophageal reflux disease)   Tobacco abuse   1.  Acute alcoholic pancreatitis: Patient will be admitted into the hospital and kept strict n.p.o. except for medications.  I will order IV fluids as well as IV pain medications.  I discussed with the patient not to eat any food or to have any ice chips.  We will monitor his abdominal examination very closely.  If bowel sounds return will consider starting clear liquids at that point.  CT scan reveals a possible small developing pseudocyst.  We will keep a close eye on the patient I do not think he requires antibiotics at this time as he has had no white blood cell count elevation and no fever.  Will monitor closely.  2.  Pancreatic pseudocyst: Possibly forming a small pseudocyst.  This is noted we will continue to monitor.  3.  Alcohol abuse: We will start the patient on a [place] scale.  4.  Gastroesophageal reflux disease: We will start a proton pump inhibitor IV  5.  Tobacco abuse we will give patient a nicotine patch.    DVT prophylaxis: Lovenox Code Status: Full code Family Communication: With patient's neighbor who is a friend who is in the room at the time of admission. Disposition Plan: Likely 3 or 4 days discharged home. Consults called: None Admission status: Observation   Lady Deutscher MD FACP [facility] Pager [phone]  If 7PM-7AM, please contact night-coverage [email or web address] Password [email or web address]  04/17/2018, 1:02 PM

## 2018-04-17 NOTE — Progress Notes (Signed)
Called and have gotten the report @1310 .

## 2018-04-17 NOTE — ED Triage Notes (Signed)
Pt in c/o abdominal pain that started last night, reports he is an alcoholic and this happens sometimes, reports n/v

## 2018-04-17 NOTE — ED Provider Notes (Signed)
[place] EMERGENCY DEPARTMENT Provider Note   CSN: [id number] Arrival date & time: 04/17/18  0736     History   Chief Complaint Chief Complaint  Patient presents with  . Abdominal Pain    HPI Steven Li is a 52 y.o. male.  52 year old male with prior history as detailed below presents with complaint of abdominal pain.  Patient reports drinking alcohol heavily yesterday.  He reports that his entire abdomen started to hurt last night.  He complains of associated nausea and vomiting.  He reports prior episodes similar to this in the past.  He denies taking any medications at home for symptoms.  Patient appears to be clinically intoxicated at time of exam.  The history is provided by the patient and medical records.  Abdominal Pain   This is a recurrent problem. The current episode started 12 to 24 hours ago. The problem occurs every several days. The problem has not changed since onset.The pain is associated with alcohol use. The pain is located in the generalized abdominal region. The pain is moderate. Associated symptoms include nausea and vomiting. Pertinent negatives include fever. Nothing aggravates the symptoms. Nothing relieves the symptoms.    Past Medical History:  Diagnosis Date  . ETOH abuse   . Hypertension     Patient Active Problem List   Diagnosis Date Noted  . History of GI bleed 10/28/2016  . Iron overload 10/28/2016  . Hyperbilirubinemia 10/03/2016  . Elevated troponin 10/03/2016  . Anemia   . Abnormal LFTs   . Acute upper GI bleed   . Encephalopathy, hepatic ([place])   . Anemia due to blood loss, acute   . Hepatitis 10/01/2016  . Alcohol abuse 10/01/2016  . Aortic atherosclerosis ([place]) 10/01/2016  . GERD (gastroesophageal reflux disease) 10/01/2016  . Transaminitis 09/30/2016  . Colitis     Past Surgical History:  Procedure Laterality Date  . none          Home Medications    Prior to Admission medications     Medication Sig Start Date End Date Taking? Authorizing Provider  bismuth subsalicylate (PEPTO BISMOL) 262 MG/15ML suspension Take 30 mLs by mouth every 6 (six) hours as needed for indigestion.    [provider]  chlordiazePOXIDE (LIBRIUM) 25 MG capsule 50mg  PO TID x 1D, then 25-50mg  PO BID X 1D, then 25-50mg  PO QD X 1D 08/11/17   Pollina, Gwenyth Allegra, MD  pantoprazole (PROTONIX) 20 MG tablet Take 1 tablet (20 mg total) 2 (two) times daily by mouth. 08/11/17   Orpah Greek, MD  sucralfate (CARAFATE) 1 GM/10ML suspension Take 10 mLs (1 g total) 4 (four) times daily -  with meals and at bedtime by mouth. 08/11/17   Orpah Greek, MD    Family History Family History  Problem Relation Age of Onset  . Cirrhosis Mother        alcoholic  . Cirrhosis Father        alcoholic     Social History Social History   Tobacco Use  . Smoking status: Current Every Day Smoker    Packs/day: 0.50    Types: Cigarettes  . Smokeless tobacco: Never Used  Substance Use Topics  . Alcohol use: Yes    Alcohol/week: 3.0 oz    Types: 5 Cans of beer per week    Comment: last time drank was the 27th of december  . Drug use: Yes    Types: Marijuana    Comment: last time  10/02/16     Allergies   Patient has no known allergies.   Review of Systems Review of Systems  Constitutional: Negative for fever.  Gastrointestinal: Positive for abdominal pain, nausea and vomiting.  All other systems reviewed and are negative.    Physical Exam Updated Vital Signs BP (!) 152/110 (BP Location: Right Arm)   Pulse (!) 108   Temp 98 F (36.7 C) (Oral)   Resp 20   SpO2 100%   Physical Exam  Constitutional: He is oriented to person, place, and time. He appears well-developed and well-nourished. No distress.  Appears intoxicated    HENT:  Head: Normocephalic and atraumatic.  Mouth/Throat: Oropharynx is clear and moist.  Eyes: Pupils are equal, round, and reactive to light.  Conjunctivae and EOM are normal.  Neck: Normal range of motion. Neck supple.  Cardiovascular: Normal rate, regular rhythm and normal heart sounds.  Pulmonary/Chest: Effort normal and breath sounds normal. No respiratory distress.  Abdominal: Soft. He exhibits no distension. There is generalized tenderness.  Musculoskeletal: Normal range of motion. He exhibits no edema or deformity.  Neurological: He is alert and oriented to person, place, and time.  Skin: Skin is warm and dry.  Psychiatric: He has a normal mood and affect.  Nursing note and vitals reviewed.    ED Treatments / Results  Labs (all labs ordered are listed, but only abnormal results are displayed) Labs Reviewed  LIPASE, BLOOD  COMPREHENSIVE METABOLIC PANEL  CBC  URINALYSIS, ROUTINE W REFLEX MICROSCOPIC  ETHANOL  TYPE AND SCREEN    EKG None  Radiology No results found.  Procedures Procedures (including critical care time)  Medications Ordered in ED Medications  sodium chloride 0.9 % bolus 1,000 mL (has no administration in time range)  morphine 4 MG/ML injection 4 mg (has no administration in time range)  ondansetron (ZOFRAN) injection 4 mg (has no administration in time range)     Initial Impression / Assessment and Plan / ED Course  I have reviewed the triage vital signs and the nursing notes.  Pertinent labs & imaging results that were available during my care of the patient were reviewed by me and considered in my medical decision making (see chart for details).     MDM  Screen complete  Presenting for evaluation abdominal pain.  Patient is an alcoholic who denies prior history of pancreatitis.  Presentation today appears to be secondary to pancreatitis.  Patient requires admission for IV fluids and pain control.  Hospitalist service Evangeline Gula) is aware of case and will evaluate for admission.      Final Clinical Impressions(s) / ED Diagnoses   Final diagnoses:  Alcohol-induced acute  pancreatitis, unspecified complication status    ED Discharge Orders    None       Valarie Merino, MD 04/17/18 1124

## 2018-04-17 NOTE — ED Notes (Signed)
Patient transported to CT 

## 2018-04-17 NOTE — Progress Notes (Signed)
Arrived @1330 ,from E.D via stretcher,able to ambulate but both hand were on his epigastric area,verbalizing pain 8/10,hands tremors were noted,visibly irritable.[place] documented.Place in telemetry bed comfortably.Alert and oriented x 4.Bed alarm set on moderate scale.Orders done and carried out.Will monitor.

## 2018-04-17 NOTE — Progress Notes (Signed)
No skin issue as assessed with Shyrl Numbers R.N.

## 2018-04-18 DIAGNOSIS — K852 Alcohol induced acute pancreatitis without necrosis or infection: Principal | ICD-10-CM

## 2018-04-18 LAB — BASIC METABOLIC PANEL
Anion gap: 9 (ref 5–15)
BUN: <5 mg/dL — ABNORMAL LOW (ref 6–20)
CALCIUM: 8.3 mg/dL — AB (ref 8.9–10.3)
CO2: 24 mmol/L (ref 22–32)
CREATININE: 0.97 mg/dL (ref 0.61–1.24)
Chloride: 105 mmol/L (ref 98–111)
GFR calc Af Amer: >60 mL/min (ref >60)
GFR calc non Af Amer: >60 mL/min (ref >60)
GLUCOSE: 92 mg/dL (ref 70–99)
Potassium: 3.9 mmol/L (ref 3.5–5.1)
Sodium: 138 mmol/L (ref 135–145)

## 2018-04-18 LAB — LIPASE, BLOOD: LIPASE: 605 U/L — AB (ref 11–51)

## 2018-04-18 LAB — HIV ANTIBODY (ROUTINE TESTING W REFLEX): HIV Screen 4th Generation wRfx: NONREACTIVE

## 2018-04-18 LAB — CBC
HCT: 37.4 % — ABNORMAL LOW (ref 39.0–52.0)
Hemoglobin: 12.8 g/dL — ABNORMAL LOW (ref 13.0–17.0)
MCH: 32.2 pg (ref 26.0–34.0)
MCHC: 34.2 g/dL (ref 30.0–36.0)
MCV: 94.2 fL (ref 78.0–100.0)
PLATELETS: 141 10*3/uL — AB (ref 150–400)
RBC: 3.97 MIL/uL — ABNORMAL LOW (ref 4.22–5.81)
RDW: 12.4 % (ref 11.5–15.5)
WBC: 7 10*3/uL (ref 4.0–10.5)

## 2018-04-18 LAB — GLUCOSE, CAPILLARY: Glucose-Capillary: 97 mg/dL (ref 70–99)

## 2018-04-18 NOTE — Progress Notes (Signed)
M.D. Jeanene Erb explained to him that patient started to refused treatment now,that he keeps pulling off his telemetry several times,curling up his iv fluid and tracing it down to the insertion site.M.D. Advised nurse that he could be send A.M.A only if his wife will get him from here.

## 2018-04-18 NOTE — Progress Notes (Signed)
M.D at the bedside.Patient apologized for his behavior earlier.

## 2018-04-18 NOTE — Progress Notes (Signed)
Paged into patient's room by CCMD,found telemetry box on top of his bed.Patient and his girlfriend seems like having an arguments before I came into the room and patient  was already on his street clothes,telling me that he is going home.Nurse responded by is your Doctor was here and told you? Patient said ''NO,im going home coz im Just lying here on that bed.,you are all not doing anything for me and I am not sick.Nurse told him about his presenting symptoms at this moment like tremors on his hand,his anxiety/agitation/irritabilty and high blood pressure early this morning.Explained to him,reasons why he is being here,the medical care and treatment.His girlfriend reinforced these by saying ''you need help,that's whu you are here''.Patient got mad at her and both of them had arguments in front of me until the girl walked. Paged M.D.

## 2018-04-18 NOTE — Progress Notes (Signed)
Called by CCMD,checked into the room,patient pulling out his telemetry,he said he wants to go home.Nurse explained need to check his vital signs especially his blood pressure.Patient said after that blood pressure ,im going home.

## 2018-04-18 NOTE — Progress Notes (Signed)
Went into the patient's room,bleeding coming from the iv site,it was partially pulled from the site,blood was dripping down to his bed linen.I immediately placed 2 x 2 on it ,pulled out the iv. Completely.Patient demeanor was much calmer.He signed A.M.A. Awake ,alert and oriented x 4.He was talking somebody on the phone requesting them to get him from here.

## 2018-04-18 NOTE — Progress Notes (Signed)
BP 183/128. Text paged Neila Gear, NP

## 2018-04-18 NOTE — Progress Notes (Signed)
Patient asked for a pain medicine ,when I am about to give it to him ,including his Lovenox,he got mad and yelled at me and said "I dont have any pain right now and I don't need that medicine.All  I need is to go home,i have been waiting that for many hours now.Do you understand,i don't think you do understand me and I want the doctor,tell him I want to go home.At this moment nurse went out from his room to de-escalate the situation and paged .M.D.

## 2018-04-18 NOTE — Progress Notes (Signed)
[facility]                                                                              Patient Demographics  Steven Li, is a 52 y.o. male, DOB - 1966-09-17, ZHY:[id number]  Admit date - 04/17/2018   Admitting Physician Lady Deutscher, MD  Outpatient Primary MD for the patient is Charlott Rakes, MD  Outpatient specialists:   LOS - 1  days    Chief Complaint  Patient presents with  . Abdominal Pain       Brief summary  Steven Li is a 52 y.o. male with medical history significant for but limited to alcohol abuse who presented with 1 day history of abdominal pain associated with nausea and vomiting, and admitted for acute alcoholic pancreatitis.     Assessment & Plan    Active Problems:   Alcohol abuse   GERD (gastroesophageal reflux disease)   Pancreatitis, alcoholic, acute   Pancreatic pseudocyst   Tobacco abuse   1.  Acute alcoholic pancreatitis:  Possible small developing pseudocyst per CT  Cont strict n.p.o. except for medication(s) with supportive care including  IV fluids as well as IV pain medications.  .Advance diet as tolerated  If bowel sounds return. .  We will keep a close eye on the patient I do not think he requires antibiotics at this time as he has had no white blood cell count elevation and no fever.  Will monitor closely.  2.  Pancreatic pseudocyst: Follow clinically  3.  Alcohol abuse:  CIWA protocol.  4. Thrombocytopenia: Mild, likely ETOH related, follow clinically  4.  Gastroesophageal reflux disease:   proton pump inhibitor IV  5.  Tobacco abuse:   nicotine patch.    DVT prophylaxis: Lovenox Code Status: Full code Family Communication: Discussed with patient and friend Disposition Plan:  home.     Time Spent in minutes 30 minutes  Procedures:   Consultants:     Antimicrobials:      Medications  Scheduled Meds: . enoxaparin (LOVENOX) injection  40 mg Subcutaneous Q24H  . [START  ON 2/95/2841] folic acid  1 mg Oral Daily  . LORazepam  0-4 mg Intravenous Q6H   Followed by  . [START ON 04/19/2018] LORazepam  0-4 mg Intravenous Q12H  . [START ON 04/22/2018] multivitamin with minerals  1 tablet Oral Daily  . nicotine  21 mg Transdermal Daily  . pantoprazole  40 mg Oral BID  . pneumococcal 23 valent vaccine  0.5 mL Intramuscular Tomorrow-1000  . sodium chloride flush  3 mL Intravenous Q12H  . [START ON 04/22/2018] thiamine  100 mg Oral Daily   Or  . [START ON 04/22/2018] thiamine  100 mg Intravenous Daily   Continuous Infusions: . 0.9 % NaCl with KCl 20 mEq / L 150 mL/hr at 04/18/18 0244  . banana bag IV 1000 mL 150 mL/hr at 04/17/18 1338   PRN Meds:.hydrALAZINE, ketorolac, LORazepam **OR** LORazepam, morphine injection, naphazoline-glycerin   Antibiotics   Anti-infectives (From admission, onward)   None        Subjective:   Steven Li was seen and examined today. He  wanted to leave AMA but after  Discussing risks of incomplete treatment he agreed to stay. Abd pain with Nausea better. No vomitting this morning  Objective:   Vitals:   04/18/18 0010 04/18/18 0246 04/18/18 0510 04/18/18 0738  BP: (!) 160/112 (!) 154/99 (!) 160/109 (!) 150/103  Pulse: 82 69 85 92  Resp:   18 18  Temp:   98.4 F (36.9 C) 97.7 F (36.5 C)  TempSrc:   Oral Oral  SpO2: 97%  97% 99%    Intake/Output Summary (Last 24 hours) at 04/18/2018 0913 Last data filed at 04/18/2018 6301 Gross per 24 hour  Intake 1000.4 ml  Output 700 ml  Net 300.4 ml     Wt Readings from Last 3 Encounters:  08/11/17 59 kg (130 lb)  10/17/16 56.7 kg (125 lb)  10/03/16 50.4 kg (111 lb 1.8 oz)     Exam  General: NAD  HEENT: NCAT,  PERRL,MMM  Neck: SUPPLE, (-) JVD  Cardiovascular: RRR, (-) GALLOP, (-) MURMUR  Respiratory: CTA  Gastrointestinal: SOFT, (-) DISTENSION, BS(+), (+) Epigastric TENDERNESS  Ext: (-) CYANOSIS, (-) EDEMA  Neuro: A, OX 3  Skin:(-) RASH  Psych:NORMAL  AFFECT/MOOD   Data Reviewed:  I have personally reviewed following labs and imaging studies  Micro Results No results found for this or any previous visit (from the past 240 hour(s)).  Radiology Reports Ct Abdomen Pelvis W Contrast  Result Date: 04/17/2018 CLINICAL DATA:  Abdominal pain started last night. Nausea, vomiting. EXAM: CT ABDOMEN AND PELVIS WITH CONTRAST TECHNIQUE: Multidetector CT imaging of the abdomen and pelvis was performed using the standard protocol following bolus administration of intravenous contrast. CONTRAST:  114mL OMNIPAQUE IOHEXOL 300 MG/ML  SOLN COMPARISON:  None. FINDINGS: Lower chest: No acute abnormality. Hepatobiliary: No focal liver abnormality is seen. Diffuse low attenuation of the liver as can be seen with hepatic steatosis. No gallstones, gallbladder wall thickening, or biliary dilatation. Pancreas: Peripancreatic inflammatory changes around the mid body of the pancreas. 2.1 x 4.2 cm fluid collection in the lesser sac. Mild pancreatic ductal dilatation without a obstructing lesion. Pancreas enhances normally and homogeneously without areas of necrosis. Spleen: Normal in size without focal abnormality. Adrenals/Urinary Tract: Adrenal glands are unremarkable. Kidneys are normal, without renal calculi, focal lesion, or hydronephrosis. Bladder is unremarkable. Stomach/Bowel: Stomach is within normal limits. Appendix appears normal. No evidence of bowel wall thickening, distention, or inflammatory changes. Vascular/Lymphatic: Aortic atherosclerosis. No enlarged abdominal or pelvic lymph nodes. Reproductive: Uterus and bilateral adnexa are unremarkable. Other: No abdominal wall hernia or abnormality. No abdominopelvic ascites. Musculoskeletal: No acute or significant osseous findings. IMPRESSION: 1. Findings consistent with acute pancreatitis without pancreatic necrosis. 2.1 x 4.2 cm fluid collection in the lesser sac which may reflect a developing pseudocyst. 2. Hepatic  steatosis. Electronically Signed   By: Kathreen Devoid   On: 04/17/2018 10:30    Lab Data:  CBC: Recent Labs  Lab 04/17/18 0809 04/17/18 1354 04/18/18 0538  WBC 8.2 8.7 7.0  HGB 14.6 14.4 12.8*  HCT 41.8 41.5 37.4*  MCV 92.5 92.4 94.2  PLT 188 173 601*   Basic Metabolic Panel: Recent Labs  Lab 04/17/18 0809 04/17/18 1354 04/18/18 0538  NA 136  --  138  K 4.0  --  3.9  CL 100  --  105  CO2 20*  --  24  GLUCOSE 110*  --  92  BUN 8  --  <5*  CREATININE 1.02 1.02 0.97  CALCIUM 9.4  --  8.3*   GFR: CrCl cannot be calculated (Unknown ideal weight.). Liver Function Tests: Recent Labs  Lab 04/17/18 0809  AST 63*  ALT 28  ALKPHOS 90  BILITOT 0.8  PROT 7.0  ALBUMIN 4.0   Recent Labs  Lab 04/17/18 0809 04/18/18 0538  LIPASE 291* 605*   No results for input(s): AMMONIA in the last 168 hours. Coagulation Profile: No results for input(s): INR, PROTIME in the last 168 hours. Cardiac Enzymes: No results for input(s): CKTOTAL, CKMB, CKMBINDEX, TROPONINI in the last 168 hours. BNP (last 3 results) No results for input(s): PROBNP in the last 8760 hours. HbA1C: No results for input(s): HGBA1C in the last 72 hours. CBG: Recent Labs  Lab 04/18/18 0736  GLUCAP 97   Lipid Profile: No results for input(s): CHOL, HDL, LDLCALC, TRIG, CHOLHDL, LDLDIRECT in the last 72 hours. Thyroid Function Tests: No results for input(s): TSH, T4TOTAL, FREET4, T3FREE, THYROIDAB in the last 72 hours. Anemia Panel: No results for input(s): VITAMINB12, FOLATE, FERRITIN, TIBC, IRON, RETICCTPCT in the last 72 hours. Urine analysis:    Component Value Date/Time   COLORURINE YELLOW 04/17/2018 [place] 04/17/2018 0949   LABSPEC 1.008 04/17/2018 0949   PHURINE 5.0 04/17/2018 0949   GLUCOSEU NEGATIVE 04/17/2018 0949   HGBUR NEGATIVE 04/17/2018 0949   BILIRUBINUR NEGATIVE 04/17/2018 0949   KETONESUR NEGATIVE 04/17/2018 0949   PROTEINUR NEGATIVE 04/17/2018 0949    UROBILINOGEN 0.2 07/07/2014 0755   NITRITE NEGATIVE 04/17/2018 0949   LEUKOCYTESUR NEGATIVE 04/17/2018 6962     Benito Mccreedy M.D. [facility] 04/18/2018, 9:13 AM  Pager: [phone] Between 7am to 7pm - call Pager - [phone]  After 7pm go to [email or web address] - password [email or web address]  Call night coverage person covering after 7pm

## 2018-04-20 NOTE — Discharge Summary (Signed)
SHELTON SQUARE, is a 52 y.o. male  DOB 27-Jan-1966  MRN [id number].  Admission date:  04/17/2018  Admitting Physician  Lady Deutscher, MD  Discharge Date:  04/20/2018   Primary MD  Charlott Rakes, MD  Recommendations for primary care physician for things to follow:  N/A - he left AMA  Admission Diagnosis  Alcohol-induced acute pancreatitis, unspecified complication status [O03.70]   Discharge Diagnosis  Alcohol-induced acute pancreatitis, unspecified complication status [W88.89]    Active Problems:   Alcohol abuse   GERD (gastroesophageal reflux disease)   Pancreatitis, alcoholic, acute   Pancreatic pseudocyst   Tobacco abuse      Past Medical History:  Diagnosis Date  . ETOH abuse   . Headache    "a few/year" (04/17/2018)  . High cholesterol   . Hypertension   . Migraine    "a few/year" (04/17/2018)    Past Surgical History:  Procedure Laterality Date  . NO PAST SURGERIES         HPI  from the history and physical done on the day of admission:    Steven Li is a 52 y.o. male with medical history significant of it and alcohol abuse who developed abdominal pain approximately 24 hours ago.  Reports drinking alcohol heavily yesterday and he states that his entire abdomen started to hurt last night.  He is complaining of associated nausea and vomiting.  He has had similar prior episode but no prior diagnosis of pancreatitis.  He does not take any medications at home for symptoms.  He is clinically intoxicated at the time of my evaluation.  The abdominal pain is a recurrent problem it started 12 to 24 hours ago.  It occurs every several days.  It has not changed since it began yesterday.  Its associated with alcohol use.  Its located in the generalized abdominal region but most severely in the epigastric area.  The pain is moderate to severe.  His symptoms include nausea and vomiting.   Pertinent negatives include fever.  Nothing makes the symptoms better he has not eaten so he does not know if that makes them worse.  It appears that nothing makes them worse.  In the emergency department he was evaluated with a CT scan which revealed a possible pseudocyst in the service sac.  Patient was started on IV fluids and given pain medication in the emergency department he is n.p.o.  He referred to me for further evaluation and management.  Interestingly: Patient reports a history of stomach cancer and that he gets radiation here locally.  He says it is down the street from here.  He cannot name a physician, he cannot name a place where he gets his radiation.  He says he has papers at home.  Also we have absolutely no records in care everywhere or in our system that reflect any information about his receiving radiation for a stomach cancer.  We requested that the patient bring in whatever records he has.  Hospital Course:  Steven Li a 52 y.o.malewith medical history significant for but limited to alcohol abuse who presented with 1 day history of abdominal pain associated with nausea and vomiting, and admitted for acute alcoholic pancreatitis.       1. Acute alcoholic pancreatitis:  Possible small developing pseudocyst per CT  Cont strict n.p.o. except for medication(s) with supportive care including  IV fluids as well as IV pain medications.  .Advance diet as tolerated If bowel sounds return.. We will keep a close eye on the patient I do not think he requires antibiotics at this time as he has had no white blood cell count elevation and no fever. Will monitor closely.  2. Pancreatic pseudocyst: Follow clinically  3. Alcohol abuse:  CIWA protocol.  4. Thrombocytopenia: Mild, likely ETOH related, follow clinically  4. Gastroesophageal reflux disease:   proton pump inhibitor IV  5. Tobacco abuse:   nicotine patch.  The next morning patient was  feeling better but his Lipase had gone up. He wanted to leave AMA and I was able to convince him to stay due to risks, and he agreed. He was alert, ox 3 and had good insight,       Discharge Condition: stable  Follow UP     Consults obtained - n/a Diet and Activity recommendation:  As advised  Discharge Instructions        Discharge Medications     Allergies as of 04/18/2018   No Known Allergies     Medication List    ASK your doctor about these medications   b complex vitamins tablet Take 1 tablet by mouth daily.   chlordiazePOXIDE 25 MG capsule Commonly known as:  LIBRIUM 50mg  PO TID x 1D, then 25-50mg  PO BID X 1D, then 25-50mg  PO QD X 1D   pantoprazole 20 MG tablet Commonly known as:  PROTONIX Take 1 tablet (20 mg total) 2 (two) times daily by mouth.   sucralfate 1 GM/10ML suspension Commonly known as:  CARAFATE Take 10 mLs (1 g total) 4 (four) times daily -  with meals and at bedtime by mouth.   tetrahydrozoline 0.05 % ophthalmic solution Place 2 drops into both eyes daily.   vitamin C 500 MG tablet Commonly known as:  ASCORBIC ACID Take 500 mg by mouth daily.       Major procedures and Radiology Reports - PLEASE review detailed and final reports for all details, in brief   Ct Abdomen Pelvis W Contrast  Result Date: 04/17/2018 CLINICAL DATA:  Abdominal pain started last night. Nausea, vomiting. EXAM: CT ABDOMEN AND PELVIS WITH CONTRAST TECHNIQUE: Multidetector CT imaging of the abdomen and pelvis was performed using the standard protocol following bolus administration of intravenous contrast. CONTRAST:  122mL OMNIPAQUE IOHEXOL 300 MG/ML  SOLN COMPARISON:  None. FINDINGS: Lower chest: No acute abnormality. Hepatobiliary: No focal liver abnormality is seen. Diffuse low attenuation of the liver as can be seen with hepatic steatosis. No gallstones, gallbladder wall thickening, or biliary dilatation. Pancreas: Peripancreatic inflammatory changes around the  mid body of the pancreas. 2.1 x 4.2 cm fluid collection in the lesser sac. Mild pancreatic ductal dilatation without a obstructing lesion. Pancreas enhances normally and homogeneously without areas of necrosis. Spleen: Normal in size without focal abnormality. Adrenals/Urinary Tract: Adrenal glands are unremarkable. Kidneys are normal, without renal calculi, focal lesion, or hydronephrosis. Bladder is unremarkable. Stomach/Bowel: Stomach is within normal limits. Appendix appears normal. No evidence of bowel wall thickening, distention, or inflammatory changes. Vascular/Lymphatic:  Aortic atherosclerosis. No enlarged abdominal or pelvic lymph nodes. Reproductive: Uterus and bilateral adnexa are unremarkable. Other: No abdominal wall hernia or abnormality. No abdominopelvic ascites. Musculoskeletal: No acute or significant osseous findings. IMPRESSION: 1. Findings consistent with acute pancreatitis without pancreatic necrosis. 2.1 x 4.2 cm fluid collection in the lesser sac which may reflect a developing pseudocyst. 2. Hepatic steatosis. Electronically Signed   By: Kathreen Devoid   On: 04/17/2018 10:30    Micro Results    No results found for this or any previous visit (from the past 240 hour(s)).     Today   Subjective    Steven Li today has no vomitting but feels better with nausea and abd pains        Patient has been seen and examined prior to discharge   Objective   Blood pressure (!) 157/116, pulse 92, temperature 97.7 F (36.5 C), temperature source Oral, resp. rate 18, SpO2 100 %.  No intake or output data in the 24 hours ending 04/20/18 1121  Exam Gen:- Awake, alert and oriented HEENT:- [place].AT, MMM Neck-Supple Neck,No JVD,  Lungs- mostly clear CV- S1, S2 normal Abd-  +ve B.Sounds, Abd Soft, (+) mild epigastric tenderness without rebound   Extremity/Skin:- Intact peripheral pulses     Data Review   CBC w Diff:  Lab Results  Component Value Date   WBC 7.0 04/18/2018   HGB  12.8 (L) 04/18/2018   HCT 37.4 (L) 04/18/2018   PLT 141 (L) 04/18/2018   LYMPHOPCT 18 10/17/2016   MONOPCT 10 10/17/2016   EOSPCT 2 10/17/2016   BASOPCT 1 10/17/2016    CMP:  Lab Results  Component Value Date   NA 138 04/18/2018   K 3.9 04/18/2018   CL 105 04/18/2018   CO2 24 04/18/2018   BUN <5 (L) 04/18/2018   CREATININE 0.97 04/18/2018   CREATININE 0.58 (L) 10/17/2016   PROT 7.0 04/17/2018   ALBUMIN 4.0 04/17/2018   BILITOT 0.8 04/17/2018   ALKPHOS 90 04/17/2018   AST 63 (H) 04/17/2018   ALT 28 04/17/2018  .     Benito Mccreedy M.D on 04/20/2018 at 11:21 AM  [facility]   Office  [phone]  Dragon dictation system was used to create this note, attempts have been made to correct errors, however presence of uncorrected errors is not a reflection quality of care provided

## 2018-06-16 ENCOUNTER — Emergency Department (HOSPITAL_COMMUNITY)
Admission: EM | Admit: 2018-06-16 | Discharge: 2018-06-16 | Disposition: A | Discharge status: Home / Self Care | Payer: Self-pay | Attending: Emergency Medicine | Admitting: Emergency Medicine

## 2018-06-16 ENCOUNTER — Emergency Department (HOSPITAL_COMMUNITY): Payer: Self-pay

## 2018-06-16 ENCOUNTER — Encounter (HOSPITAL_COMMUNITY): Payer: Self-pay | Admitting: *Deleted

## 2018-06-16 ENCOUNTER — Other Ambulatory Visit: Payer: Self-pay

## 2018-06-16 DIAGNOSIS — F17210 Nicotine dependence, cigarettes, uncomplicated: Secondary | ICD-10-CM | POA: Insufficient documentation

## 2018-06-16 DIAGNOSIS — K860 Alcohol-induced chronic pancreatitis: Secondary | ICD-10-CM | POA: Insufficient documentation

## 2018-06-16 DIAGNOSIS — I10 Essential (primary) hypertension: Secondary | ICD-10-CM | POA: Insufficient documentation

## 2018-06-16 LAB — COMPREHENSIVE METABOLIC PANEL
ALT: 26 U/L (ref 0–44)
ANION GAP: 12 (ref 5–15)
AST: 40 U/L (ref 15–41)
Albumin: 4 g/dL (ref 3.5–5.0)
Alkaline Phosphatase: 91 U/L (ref 38–126)
BUN: 19 mg/dL (ref 6–20)
CHLORIDE: 101 mmol/L (ref 98–111)
CO2: 26 mmol/L (ref 22–32)
CREATININE: 0.88 mg/dL (ref 0.61–1.24)
Calcium: 9.3 mg/dL (ref 8.9–10.3)
Glucose, Bld: 105 mg/dL — ABNORMAL HIGH (ref 70–99)
POTASSIUM: 4.4 mmol/L (ref 3.5–5.1)
Sodium: 139 mmol/L (ref 135–145)
Total Bilirubin: 0.8 mg/dL (ref 0.3–1.2)
Total Protein: 7.6 g/dL (ref 6.5–8.1)

## 2018-06-16 LAB — CBC
HCT: 39.2 % (ref 39.0–52.0)
Hemoglobin: 13.9 g/dL (ref 13.0–17.0)
MCH: 33 pg (ref 26.0–34.0)
MCHC: 35.5 g/dL (ref 30.0–36.0)
MCV: 93.1 fL (ref 78.0–100.0)
Platelets: 258 10*3/uL (ref 150–400)
RBC: 4.21 MIL/uL — ABNORMAL LOW (ref 4.22–5.81)
RDW: 12.5 % (ref 11.5–15.5)
WBC: 11.9 10*3/uL — ABNORMAL HIGH (ref 4.0–10.5)

## 2018-06-16 LAB — ETHANOL: ALCOHOL ETHYL (B): 23 mg/dL — AB (ref <10)

## 2018-06-16 LAB — LIPASE, BLOOD: LIPASE: 30 U/L (ref 11–51)

## 2018-06-16 MED ORDER — ONDANSETRON HCL 4 MG/2ML IJ SOLN
4.0000 mg | Freq: Once | INTRAMUSCULAR | Status: AC
  Administered 2018-06-16: 4 mg via INTRAVENOUS
  Filled 2018-06-16: qty 2

## 2018-06-16 MED ORDER — SUCRALFATE 1 GM/10ML PO SUSP: 1.0000 g | Freq: Three times a day (TID) | ORAL | 0 refills | Status: DC

## 2018-06-16 MED ORDER — PANTOPRAZOLE SODIUM 20 MG PO TBEC: 20.0000 mg | DELAYED_RELEASE_TABLET | Freq: Two times a day (BID) | ORAL | 0 refills | Status: DC

## 2018-06-16 MED ORDER — SODIUM CHLORIDE 0.9 % IV BOLUS
1000.0000 mL | Freq: Once | INTRAVENOUS | Status: AC
  Administered 2018-06-16: 1000 mL via INTRAVENOUS

## 2018-06-16 MED ORDER — IOPAMIDOL (ISOVUE-300) INJECTION 61%
100.0000 mL | Freq: Once | INTRAVENOUS | Status: AC | PRN
  Administered 2018-06-16: 100 mL via INTRAVENOUS

## 2018-06-16 MED ORDER — ADULT MULTIVITAMIN W/MINERALS CH: 1.0000 | ORAL_TABLET | Freq: Every day | ORAL | 0 refills | Status: DC

## 2018-06-16 MED ORDER — IOPAMIDOL (ISOVUE-300) INJECTION 61%
INTRAVENOUS | Status: AC
  Filled 2018-06-16: qty 100

## 2018-06-16 MED ORDER — FAMOTIDINE IN NACL 20-0.9 MG/50ML-% IV SOLN
20.0000 mg | Freq: Two times a day (BID) | INTRAVENOUS | Status: DC
  Administered 2018-06-16: 20 mg via INTRAVENOUS
  Filled 2018-06-16: qty 50

## 2018-06-16 MED ORDER — MORPHINE SULFATE (PF) 4 MG/ML IV SOLN
4.0000 mg | Freq: Once | INTRAVENOUS | Status: AC
  Administered 2018-06-16: 4 mg via INTRAVENOUS
  Filled 2018-06-16: qty 1

## 2018-06-16 NOTE — ED Provider Notes (Signed)
[facility] [facility] DEPT Provider Note   CSN: [id number] Arrival date & time: 06/16/18  1124     History   Chief Complaint Chief Complaint  Patient presents with  . Abdominal Pain    HPI Steven Li is a 52 y.o. male.  HPI 52 year old male with a history of alcohol abuse presents the emergency department with mild generalized upper abdominal discomfort over the past month which worsened this morning.  This morning he had associated nausea and vomiting.  He does report drinking more alcohol than usual last night.  Denies hematemesis.  No fevers or chills.  No lower abdominal pain.  Denies diarrhea.  Pain is moderate in severity.   Past Medical History:  Diagnosis Date  . ETOH abuse   . Headache    "a few/year" (04/17/2018)  . High cholesterol   . Hypertension   . Migraine    "a few/year" (04/17/2018)    Patient Active Problem List   Diagnosis Date Noted  . Pancreatitis, alcoholic, acute 94/49/6759  . Pancreatic pseudocyst 04/17/2018  . Tobacco abuse 04/17/2018  . History of GI bleed 10/28/2016  . Iron overload 10/28/2016  . Hyperbilirubinemia 10/03/2016  . Elevated troponin 10/03/2016  . Anemia   . Abnormal LFTs   . Acute upper GI bleed   . Encephalopathy, hepatic ([place])   . Anemia due to blood loss, acute   . Hepatitis 10/01/2016  . Alcohol abuse 10/01/2016  . Aortic atherosclerosis ([place]) 10/01/2016  . GERD (gastroesophageal reflux disease) 10/01/2016  . Transaminitis 09/30/2016  . Colitis     Past Surgical History:  Procedure Laterality Date  . NO PAST SURGERIES          Home Medications    Prior to Admission medications   Medication Sig Start Date End Date Taking? Authorizing Provider  Multiple Vitamin (MULTIVITAMIN WITH MINERALS) TABS tablet Take 1 tablet by mouth daily. 06/16/18   Jola Schmidt, MD  pantoprazole (PROTONIX) 20 MG tablet Take 1 tablet (20 mg total) by mouth 2 (two) times daily. 06/16/18   Jola Schmidt, MD    sucralfate (CARAFATE) 1 GM/10ML suspension Take 10 mLs (1 g total) by mouth 4 (four) times daily -  with meals and at bedtime. 06/16/18   Jola Schmidt, MD    Family History Family History  Problem Relation Age of Onset  . Cirrhosis Mother        alcoholic  . Stomach cancer Mother   . Cirrhosis Father        alcoholic     Social History Social History   Tobacco Use  . Smoking status: Current Every Day Smoker    Packs/day: 1.00    Years: 33.00    Pack years: 33.00    Types: Cigarettes  . Smokeless tobacco: Never Used  Substance Use Topics  . Alcohol use: Yes    Alcohol/week: 84.0 standard drinks    Types: 84 Cans of beer per week    Comment: 04/17/2018 "12 pack of beer qd"  . Drug use: Yes    Types: Marijuana    Comment: 04/17/2018 "qd"     Allergies   Patient has no known allergies.   Review of Systems Review of Systems  All other systems reviewed and are negative.    Physical Exam Updated Vital Signs BP (!) 162/109   Pulse 68   Temp 98 F (36.7 C) (Oral)   Resp 16   SpO2 100%   Physical Exam  Constitutional: He is oriented  to person, place, and time. He appears well-developed and well-nourished.  HENT:  Head: Normocephalic and atraumatic.  Eyes: EOM are normal.  Neck: Normal range of motion.  Cardiovascular: Normal rate, regular rhythm and normal heart sounds.  Pulmonary/Chest: Effort normal and breath sounds normal. No respiratory distress.  Abdominal: Soft. He exhibits no distension.  Mild generalized upper abdominal tenderness without guarding or rebound.  Musculoskeletal: Normal range of motion.  Neurological: He is alert and oriented to person, place, and time.  Skin: Skin is warm and dry.  Psychiatric: He has a normal mood and affect. Judgment normal.  Nursing note and vitals reviewed.    ED Treatments / Results  Labs (all labs ordered are listed, but only abnormal results are displayed) Labs Reviewed  CBC - Abnormal; Notable for the  following components:      Result Value   WBC 11.9 (*)    RBC 4.21 (*)    All other components within normal limits  COMPREHENSIVE METABOLIC PANEL - Abnormal; Notable for the following components:   Glucose, Bld 105 (*)    All other components within normal limits  ETHANOL - Abnormal; Notable for the following components:   Alcohol, Ethyl (B) 23 (*)    All other components within normal limits  LIPASE, BLOOD    EKG None  Radiology Ct Abdomen Pelvis W Contrast  Result Date: 06/16/2018 CLINICAL DATA:  Acute generalized abdominal pain. EXAM: CT ABDOMEN AND PELVIS WITH CONTRAST TECHNIQUE: Multidetector CT imaging of the abdomen and pelvis was performed using the standard protocol following bolus administration of intravenous contrast. CONTRAST:  169mL ISOVUE-300 IOPAMIDOL (ISOVUE-300) INJECTION 61% COMPARISON:  CT scan of April 17, 2018. FINDINGS: Lower chest: No acute abnormality. Hepatobiliary: No focal liver abnormality is seen. No gallstones, gallbladder wall thickening, or biliary dilatation. Pancreas: Ill-defined low density is seen involving the pancreatic body which may represent focal inflammation and possible necrosis. Stable mild ductal dilatation is noted. 12 mm low density is noted in this area which is increased compared to prior exam and may represent small pseudo cyst within the pancreas. Spleen: Normal in size without focal abnormality. Adrenals/Urinary Tract: Adrenal glands are unremarkable. Kidneys are normal, without renal calculi, focal lesion, or hydronephrosis. Bladder is unremarkable. Stomach/Bowel: Stomach is within normal limits. Appendix appears normal. No evidence of bowel wall thickening, distention, or inflammatory changes. Vascular/Lymphatic: Aortic atherosclerosis. No enlarged abdominal or pelvic lymph nodes. Reproductive: Prostate is unremarkable. Other: No abdominal wall hernia or abnormality. No abdominopelvic ascites. Musculoskeletal: No acute or significant osseous  findings. IMPRESSION: Ill-defined low density is noted posteriorly in the pancreatic body which may represent focal inflammation and possible pancreatic necrosis given the lack of enhancement in this region. Smaller well-defined 12 mm low density is noted within the pancreatic body which is increased in size compared to prior exam which may represent small pseudocyst within the pancreas. Clinical correlation is recommended. Aortic Atherosclerosis (ICD10-I70.0). Electronically Signed   By: Marijo Conception, M.D.   On: 06/16/2018 13:52    Procedures Procedures (including critical care time)  Medications Ordered in ED Medications  famotidine (PEPCID) IVPB 20 mg premix (0 mg Intravenous Stopped 06/16/18 1401)  iopamidol (ISOVUE-300) 61 % injection (has no administration in time range)  morphine 4 MG/ML injection 4 mg (4 mg Intravenous Given 06/16/18 1242)  ondansetron (ZOFRAN) injection 4 mg (4 mg Intravenous Given 06/16/18 1242)  sodium chloride 0.9 % bolus 1,000 mL (0 mLs Intravenous Stopped 06/16/18 1403)  iopamidol (ISOVUE-300) 61 % injection 100  mL (100 mLs Intravenous Contrast Given 06/16/18 1318)     Initial Impression / Assessment and Plan / ED Course  I have reviewed the triage vital signs and the nursing notes.  Pertinent labs & imaging results that were available during my care of the patient were reviewed by me and considered in my medical decision making (see chart for details).     Likely chronic pancreatitis.  May have developing pseudocyst.  Afebrile.  Doubt infection of the pseudocyst.  Feels much better after pain medication here in the emergency department and IV fluids.  Recommended decreasing his alcohol use as well as use of daily PPI.  Outpatient primary care follow-up.  Patient encouraged to return to the emergency department for new or worsening symptoms  Final Clinical Impressions(s) / ED Diagnoses   Final diagnoses:  Alcohol-induced chronic pancreatitis [facility])    ED  Discharge Orders         Ordered    sucralfate (CARAFATE) 1 GM/10ML suspension  3 times daily with meals & bedtime     06/16/18 1400    pantoprazole (PROTONIX) 20 MG tablet  2 times daily     06/16/18 1400    Multiple Vitamin (MULTIVITAMIN WITH MINERALS) TABS tablet  Daily     06/16/18 1400           Jola Schmidt, MD 06/16/18 1415

## 2018-06-16 NOTE — ED Triage Notes (Signed)
Pt bib EMS and presents with abd pain x years.  Pt reports a flare up in pain about a month ago.  Pt reports intermittent nausea with an episode vomiting.  Hx ETOH abuse and reports drinking last night.

## 2018-09-06 ENCOUNTER — Inpatient Hospital Stay (HOSPITAL_COMMUNITY)
Admission: EM | Admit: 2018-09-06 | Discharge: 2018-09-08 | DRG: 440 | Disposition: A | Discharge status: Home / Self Care | Payer: Self-pay | Attending: Nephrology | Admitting: Nephrology

## 2018-09-06 ENCOUNTER — Encounter (HOSPITAL_COMMUNITY): Payer: Self-pay | Admitting: Emergency Medicine

## 2018-09-06 ENCOUNTER — Other Ambulatory Visit: Payer: Self-pay

## 2018-09-06 ENCOUNTER — Emergency Department (HOSPITAL_COMMUNITY): Payer: Self-pay

## 2018-09-06 DIAGNOSIS — Z8719 Personal history of other diseases of the digestive system: Secondary | ICD-10-CM

## 2018-09-06 DIAGNOSIS — Z79899 Other long term (current) drug therapy: Secondary | ICD-10-CM

## 2018-09-06 DIAGNOSIS — Z720 Tobacco use: Secondary | ICD-10-CM | POA: Diagnosis present

## 2018-09-06 DIAGNOSIS — E7800 Pure hypercholesterolemia, unspecified: Secondary | ICD-10-CM | POA: Diagnosis present

## 2018-09-06 DIAGNOSIS — I10 Essential (primary) hypertension: Secondary | ICD-10-CM | POA: Diagnosis present

## 2018-09-06 DIAGNOSIS — K219 Gastro-esophageal reflux disease without esophagitis: Secondary | ICD-10-CM | POA: Diagnosis present

## 2018-09-06 DIAGNOSIS — K279 Peptic ulcer, site unspecified, unspecified as acute or chronic, without hemorrhage or perforation: Secondary | ICD-10-CM | POA: Diagnosis present

## 2018-09-06 DIAGNOSIS — K860 Alcohol-induced chronic pancreatitis: Secondary | ICD-10-CM | POA: Diagnosis present

## 2018-09-06 DIAGNOSIS — R945 Abnormal results of liver function studies: Secondary | ICD-10-CM | POA: Diagnosis present

## 2018-09-06 DIAGNOSIS — F17210 Nicotine dependence, cigarettes, uncomplicated: Secondary | ICD-10-CM | POA: Diagnosis present

## 2018-09-06 DIAGNOSIS — E785 Hyperlipidemia, unspecified: Secondary | ICD-10-CM | POA: Diagnosis present

## 2018-09-06 DIAGNOSIS — K852 Alcohol induced acute pancreatitis without necrosis or infection: Principal | ICD-10-CM | POA: Diagnosis present

## 2018-09-06 DIAGNOSIS — R7989 Other specified abnormal findings of blood chemistry: Secondary | ICD-10-CM | POA: Diagnosis present

## 2018-09-06 DIAGNOSIS — K709 Alcoholic liver disease, unspecified: Secondary | ICD-10-CM | POA: Diagnosis present

## 2018-09-06 DIAGNOSIS — F1010 Alcohol abuse, uncomplicated: Secondary | ICD-10-CM | POA: Diagnosis present

## 2018-09-06 LAB — COMPREHENSIVE METABOLIC PANEL
ALK PHOS: 105 U/L (ref 38–126)
ALT: 286 U/L — AB (ref 0–44)
AST: 800 U/L — ABNORMAL HIGH (ref 15–41)
Albumin: 3.5 g/dL (ref 3.5–5.0)
Anion gap: 13 (ref 5–15)
BILIRUBIN TOTAL: 0.8 mg/dL (ref 0.3–1.2)
BUN: 11 mg/dL (ref 6–20)
CALCIUM: 8.6 mg/dL — AB (ref 8.9–10.3)
CO2: 20 mmol/L — AB (ref 22–32)
CREATININE: 1.08 mg/dL (ref 0.61–1.24)
Chloride: 98 mmol/L (ref 98–111)
GFR calc Af Amer: >60 mL/min (ref >60)
Glucose, Bld: 132 mg/dL — ABNORMAL HIGH (ref 70–99)
Potassium: 3.9 mmol/L (ref 3.5–5.1)
SODIUM: 131 mmol/L — AB (ref 135–145)
TOTAL PROTEIN: 6.6 g/dL (ref 6.5–8.1)

## 2018-09-06 LAB — CBC WITH DIFFERENTIAL/PLATELET
ABS IMMATURE GRANULOCYTES: 0.03 10*3/uL (ref 0.00–0.07)
BASOS ABS: 0.1 10*3/uL (ref 0.0–0.1)
BASOS PCT: 1 %
Eosinophils Absolute: 0.2 10*3/uL (ref 0.0–0.5)
Eosinophils Relative: 4 %
HCT: 40.1 % (ref 39.0–52.0)
Hemoglobin: 12.9 g/dL — ABNORMAL LOW (ref 13.0–17.0)
IMMATURE GRANULOCYTES: 1 %
LYMPHS PCT: 31 %
Lymphs Abs: 1.9 10*3/uL (ref 0.7–4.0)
MCH: 30 pg (ref 26.0–34.0)
MCHC: 32.2 g/dL (ref 30.0–36.0)
MCV: 93.3 fL (ref 80.0–100.0)
MONOS PCT: 15 %
Monocytes Absolute: 0.9 10*3/uL (ref 0.1–1.0)
NEUTROS ABS: 3 10*3/uL (ref 1.7–7.7)
Neutrophils Relative %: 48 %
PLATELETS: 311 10*3/uL (ref 150–400)
RBC: 4.3 MIL/uL (ref 4.22–5.81)
RDW: 12.2 % (ref 11.5–15.5)
WBC: 6.1 10*3/uL (ref 4.0–10.5)
nRBC: 0 % (ref 0.0–0.2)

## 2018-09-06 LAB — URINALYSIS, ROUTINE W REFLEX MICROSCOPIC
Bilirubin Urine: NEGATIVE
Glucose, UA: NEGATIVE mg/dL
Hgb urine dipstick: NEGATIVE
Ketones, ur: NEGATIVE mg/dL
LEUKOCYTES UA: NEGATIVE
NITRITE: NEGATIVE
PROTEIN: NEGATIVE mg/dL
Specific Gravity, Urine: 1.01 (ref 1.005–1.030)
pH: 5 (ref 5.0–8.0)

## 2018-09-06 LAB — ETHANOL: Alcohol, Ethyl (B): <10 mg/dL (ref <10)

## 2018-09-06 LAB — LIPASE, BLOOD: LIPASE: 690 U/L — AB (ref 11–51)

## 2018-09-06 MED ORDER — IOHEXOL 300 MG/ML  SOLN
100.0000 mL | Freq: Once | INTRAMUSCULAR | Status: AC | PRN
  Administered 2018-09-06: 100 mL via INTRAVENOUS

## 2018-09-06 MED ORDER — ONDANSETRON HCL 4 MG/2ML IJ SOLN
4.0000 mg | Freq: Once | INTRAMUSCULAR | Status: AC
  Administered 2018-09-06: 4 mg via INTRAVENOUS
  Filled 2018-09-06: qty 2

## 2018-09-06 MED ORDER — MORPHINE SULFATE (PF) 4 MG/ML IV SOLN
4.0000 mg | Freq: Once | INTRAVENOUS | Status: AC
  Administered 2018-09-06: 4 mg via INTRAVENOUS
  Filled 2018-09-06: qty 1

## 2018-09-06 MED ORDER — SODIUM CHLORIDE 0.9 % IV BOLUS
1000.0000 mL | Freq: Once | INTRAVENOUS | Status: AC
  Administered 2018-09-06: 1000 mL via INTRAVENOUS

## 2018-09-06 MED ORDER — ALUM & MAG HYDROXIDE-SIMETH 200-200-20 MG/5ML PO SUSP
30.0000 mL | Freq: Once | ORAL | Status: AC
  Administered 2018-09-06: 30 mL via ORAL
  Filled 2018-09-06: qty 30

## 2018-09-06 MED ORDER — FAMOTIDINE IN NACL 20-0.9 MG/50ML-% IV SOLN
20.0000 mg | Freq: Once | INTRAVENOUS | Status: AC
  Administered 2018-09-06: 20 mg via INTRAVENOUS
  Filled 2018-09-06: qty 50

## 2018-09-06 MED ORDER — LIDOCAINE VISCOUS HCL 2 % MT SOLN
15.0000 mL | Freq: Once | OROMUCOSAL | Status: AC
  Administered 2018-09-06: 15 mL via ORAL
  Filled 2018-09-06: qty 15

## 2018-09-06 NOTE — ED Provider Notes (Signed)
11:45 PM  Assumed care from Dr. Ralene Bathe.  Patient is a 52 year old male with history of alcoholic cirrhosis presents to the emergency department with abdominal pain, nausea and vomiting.  Has elevated AST and ALT today.  Lipase, CT abdomen pelvis pending.  12:35 AM  Pt's lipase is 690.  CT scan shows acute pancreatitis without necrosis, pseudotumor, abscess.  No ascites.  He reports no pain control with morphine.  Will give Dilaudid and IV fluids.  He is requesting admission for pain control which I feel is reasonable.  Will discuss with medicine for admission.  PCP is Dr. Margarita Rana.   12:53 AM Discussed patient's case with hospitalist, Dr. Blaine Hamper.  I have recommended admission and patient (and family if present) agree with this plan. Admitting physician will place admission orders.   I reviewed all nursing notes, vitals, pertinent previous records, EKGs, lab and urine results, imaging (as available).     Ward, Delice Bison, DO 09/07/18 [phone]

## 2018-09-06 NOTE — ED Provider Notes (Signed)
[facility] EMERGENCY DEPARTMENT Provider Note   CSN: [id number] Arrival date & time: 09/06/18  2119     History   Chief Complaint Chief Complaint  Patient presents with  . Abdominal Pain  . Nausea  . Emesis    HPI EVREN SHANKLAND is a 52 y.o. male.  The history is provided by the patient. No language interpreter was used.  Abdominal Pain   Associated symptoms include vomiting.  Emesis   Associated symptoms include abdominal pain.   PERVIS MACINTYRE is a 52 y.o. male who presents to the Emergency Department complaining of abdominal pain. Since to the emergency department for evaluation of abdominal pain that began about 1 PM today. Pain is described as generalized abdominal pain and is constant nature. He had two episodes associated emesis. He denies any fevers, dysuria, diarrhea. He does have mild constipation. He denies any hematemesis. He states he has similar symptoms multiple times in the past secondary to drinking alcohol. He did have 3 12 ounce beers today. He drinks alcohol only on Sundays. He uses marijuana. Symptoms are severe and constant. Past Medical History:  Diagnosis Date  . ETOH abuse   . Headache    "a few/year" (04/17/2018)  . High cholesterol   . Hypertension   . Migraine    "a few/year" (04/17/2018)    Patient Active Problem List   Diagnosis Date Noted  . Pancreatitis, alcoholic, acute 08/67/6195  . Pancreatic pseudocyst 04/17/2018  . Tobacco abuse 04/17/2018  . History of GI bleed 10/28/2016  . Iron overload 10/28/2016  . Hyperbilirubinemia 10/03/2016  . Elevated troponin 10/03/2016  . Anemia   . Abnormal LFTs   . Acute upper GI bleed   . Encephalopathy, hepatic ([place])   . Anemia due to blood loss, acute   . Hepatitis 10/01/2016  . Alcohol abuse 10/01/2016  . Aortic atherosclerosis ([place]) 10/01/2016  . GERD (gastroesophageal reflux disease) 10/01/2016  . Transaminitis 09/30/2016  . Colitis     Past Surgical History:    Procedure Laterality Date  . NO PAST SURGERIES          Home Medications    Prior to Admission medications   Medication Sig Start Date End Date Taking? Authorizing Provider  Multiple Vitamin (MULTIVITAMIN WITH MINERALS) TABS tablet Take 1 tablet by mouth daily. 06/16/18   Jola Schmidt, MD  pantoprazole (PROTONIX) 20 MG tablet Take 1 tablet (20 mg total) by mouth 2 (two) times daily. 06/16/18   Jola Schmidt, MD  sucralfate (CARAFATE) 1 GM/10ML suspension Take 10 mLs (1 g total) by mouth 4 (four) times daily -  with meals and at bedtime. 06/16/18   Jola Schmidt, MD    Family History Family History  Problem Relation Age of Onset  . Cirrhosis Mother        alcoholic  . Stomach cancer Mother   . Cirrhosis Father        alcoholic     Social History Social History   Tobacco Use  . Smoking status: Current Every Day Smoker    Packs/day: 1.00    Years: 33.00    Pack years: 33.00    Types: Cigarettes  . Smokeless tobacco: Never Used  Substance Use Topics  . Alcohol use: Yes    Alcohol/week: 84.0 standard drinks    Types: 84 Cans of beer per week    Comment: 04/17/2018 "12 pack of beer qd"  . Drug use: Yes    Types: Marijuana  Comment: 04/17/2018 "qd"     Allergies   Patient has no known allergies.   Review of Systems Review of Systems  Gastrointestinal: Positive for abdominal pain and vomiting.  All other systems reviewed and are negative.    Physical Exam Updated Vital Signs BP (!) 172/113   Pulse 62   Temp 98.3 F (36.8 C) (Oral)   Resp 12   Ht 5\' 8"  (1.727 m)   Wt 59 kg   SpO2 99%   BMI 19.77 kg/m   Physical Exam  Constitutional: He is oriented to person, place, and time. He appears well-developed and well-nourished.  HENT:  Head: Normocephalic and atraumatic.  Cardiovascular: Normal rate and regular rhythm.  No murmur heard. Pulmonary/Chest: Effort normal and breath sounds normal. No respiratory distress.  Abdominal: Soft. There is no rebound  and no guarding.  Moderate generalized abdominal tenderness without guarding or rebound.   Musculoskeletal: He exhibits no edema or tenderness.  Neurological: He is alert and oriented to person, place, and time.  Skin: Skin is warm and dry.  Psychiatric: He has a normal mood and affect. His behavior is normal.  Nursing note and vitals reviewed.    ED Treatments / Results  Labs (all labs ordered are listed, but only abnormal results are displayed) Labs Reviewed  COMPREHENSIVE METABOLIC PANEL - Abnormal; Notable for the following components:      Result Value   Sodium 131 (*)    CO2 20 (*)    Glucose, Bld 132 (*)    Calcium 8.6 (*)    AST 800 (*)    ALT 286 (*)    All other components within normal limits  CBC WITH DIFFERENTIAL/PLATELET - Abnormal; Notable for the following components:   Hemoglobin 12.9 (*)    All other components within normal limits  ETHANOL  URINALYSIS, ROUTINE W REFLEX MICROSCOPIC  LIPASE, BLOOD    EKG None  Radiology No results found.  Procedures Procedures (including critical care time)  Medications Ordered in ED Medications  morphine 4 MG/ML injection 4 mg (has no administration in time range)  sodium chloride 0.9 % bolus 1,000 mL (0 mLs Intravenous Stopped 09/06/18 2254)  famotidine (PEPCID) IVPB 20 mg premix (0 mg Intravenous Stopped 09/06/18 2233)  alum & mag hydroxide-simeth (MAALOX/MYLANTA) 200-200-20 MG/5ML suspension 30 mL (30 mLs Oral Given 09/06/18 2149)    And  lidocaine (XYLOCAINE) 2 % viscous mouth solution 15 mL (15 mLs Oral Given 09/06/18 2149)  ondansetron (ZOFRAN) injection 4 mg (4 mg Intravenous Given 09/06/18 2148)     Initial Impression / Assessment and Plan / ED Course  I have reviewed the triage vital signs and the nursing notes.  Pertinent labs & imaging results that were available during my care of the patient were reviewed by me and considered in my medical decision making (see chart for details).     Patient with  history of pancreatitis, alcohol abuse here for evaluation of generalized abdominal pain, nausea and vomiting. He does have generalized tenderness on examination but is non-toxic appearing. No significant improvement in symptoms with Pepcid, G.I. cocktail, will provide morphine pending labs and imaging. Patient care transferred pending imaging.  Final Clinical Impressions(s) / ED Diagnoses   Final diagnoses:  None    ED Discharge Orders    None       Quintella Reichert, MD 09/06/18 2343

## 2018-09-06 NOTE — ED Triage Notes (Signed)
BIB EMS for abd pain, N/V. Hx ulcers. Reports small amount of blood in last stool. Told EMS he had 3 beers around 1300. Ate oatmeal, which he threw up.

## 2018-09-06 NOTE — ED Notes (Signed)
Patient transported to CT 

## 2018-09-07 DIAGNOSIS — R945 Abnormal results of liver function studies: Secondary | ICD-10-CM

## 2018-09-07 DIAGNOSIS — F1010 Alcohol abuse, uncomplicated: Secondary | ICD-10-CM

## 2018-09-07 DIAGNOSIS — Z8719 Personal history of other diseases of the digestive system: Secondary | ICD-10-CM

## 2018-09-07 DIAGNOSIS — I10 Essential (primary) hypertension: Secondary | ICD-10-CM | POA: Diagnosis present

## 2018-09-07 DIAGNOSIS — K279 Peptic ulcer, site unspecified, unspecified as acute or chronic, without hemorrhage or perforation: Secondary | ICD-10-CM | POA: Diagnosis present

## 2018-09-07 DIAGNOSIS — K860 Alcohol-induced chronic pancreatitis: Secondary | ICD-10-CM | POA: Diagnosis present

## 2018-09-07 DIAGNOSIS — Z720 Tobacco use: Secondary | ICD-10-CM

## 2018-09-07 DIAGNOSIS — K219 Gastro-esophageal reflux disease without esophagitis: Secondary | ICD-10-CM

## 2018-09-07 DIAGNOSIS — K852 Alcohol induced acute pancreatitis without necrosis or infection: Principal | ICD-10-CM

## 2018-09-07 LAB — CBC
HEMATOCRIT: 38.7 % — AB (ref 39.0–52.0)
HEMOGLOBIN: 12.7 g/dL — AB (ref 13.0–17.0)
MCH: 30.3 pg (ref 26.0–34.0)
MCHC: 32.8 g/dL (ref 30.0–36.0)
MCV: 92.4 fL (ref 80.0–100.0)
Platelets: 271 10*3/uL (ref 150–400)
RBC: 4.19 MIL/uL — AB (ref 4.22–5.81)
RDW: 12.1 % (ref 11.5–15.5)
WBC: 10.7 10*3/uL — ABNORMAL HIGH (ref 4.0–10.5)
nRBC: 0 % (ref 0.0–0.2)

## 2018-09-07 LAB — BASIC METABOLIC PANEL
Anion gap: 7 (ref 5–15)
BUN: 9 mg/dL (ref 6–20)
CO2: 24 mmol/L (ref 22–32)
Calcium: 8.1 mg/dL — ABNORMAL LOW (ref 8.9–10.3)
Chloride: 103 mmol/L (ref 98–111)
Creatinine, Ser: 1.02 mg/dL (ref 0.61–1.24)
GFR calc Af Amer: >60 mL/min (ref >60)
GFR calc non Af Amer: >60 mL/min (ref >60)
Glucose, Bld: 130 mg/dL — ABNORMAL HIGH (ref 70–99)
Potassium: 4 mmol/L (ref 3.5–5.1)
Sodium: 134 mmol/L — ABNORMAL LOW (ref 135–145)

## 2018-09-07 LAB — TYPE AND SCREEN
ABO/RH(D): A POS
ANTIBODY SCREEN: NEGATIVE

## 2018-09-07 LAB — TRIGLYCERIDES: Triglycerides: 535 mg/dL — ABNORMAL HIGH (ref <150)

## 2018-09-07 MED ORDER — PANTOPRAZOLE SODIUM 40 MG IV SOLR
40.0000 mg | Freq: Two times a day (BID) | INTRAVENOUS | Status: DC
  Administered 2018-09-07 – 2018-09-08 (×4): 40 mg via INTRAVENOUS
  Filled 2018-09-07 (×4): qty 40

## 2018-09-07 MED ORDER — HYDRALAZINE HCL 20 MG/ML IJ SOLN: 5.0000 mg | INTRAMUSCULAR | Status: DC | PRN

## 2018-09-07 MED ORDER — HYDROMORPHONE HCL 1 MG/ML IJ SOLN
1.0000 mg | INTRAMUSCULAR | Status: DC | PRN
  Administered 2018-09-07 – 2018-09-08 (×5): 1 mg via INTRAVENOUS
  Filled 2018-09-07 (×5): qty 1

## 2018-09-07 MED ORDER — THIAMINE HCL 100 MG/ML IJ SOLN
100.0000 mg | Freq: Every day | INTRAMUSCULAR | Status: DC
  Filled 2018-09-07: qty 2

## 2018-09-07 MED ORDER — VITAMIN B-1 100 MG PO TABS
100.0000 mg | ORAL_TABLET | Freq: Every day | ORAL | Status: DC
  Administered 2018-09-07 – 2018-09-08 (×2): 100 mg via ORAL
  Filled 2018-09-07 (×2): qty 1

## 2018-09-07 MED ORDER — ADULT MULTIVITAMIN W/MINERALS CH
1.0000 | ORAL_TABLET | Freq: Every day | ORAL | Status: DC
  Administered 2018-09-07 – 2018-09-08 (×2): 1 via ORAL
  Filled 2018-09-07 (×2): qty 1

## 2018-09-07 MED ORDER — LORAZEPAM 1 MG PO TABS
1.0000 mg | ORAL_TABLET | Freq: Four times a day (QID) | ORAL | Status: DC | PRN
  Administered 2018-09-07: 1 mg via ORAL
  Filled 2018-09-07: qty 1

## 2018-09-07 MED ORDER — ZOLPIDEM TARTRATE 5 MG PO TABS
5.0000 mg | ORAL_TABLET | Freq: Every evening | ORAL | Status: DC | PRN
  Administered 2018-09-07: 5 mg via ORAL
  Filled 2018-09-07: qty 1

## 2018-09-07 MED ORDER — OXYCODONE HCL 5 MG PO TABS
5.0000 mg | ORAL_TABLET | ORAL | Status: DC | PRN
  Administered 2018-09-07 (×2): 5 mg via ORAL
  Filled 2018-09-07 (×2): qty 1

## 2018-09-07 MED ORDER — SODIUM CHLORIDE 0.9 % IV SOLN
INTRAVENOUS | Status: DC
  Administered 2018-09-07 – 2018-09-08 (×4): via INTRAVENOUS

## 2018-09-07 MED ORDER — HYDROMORPHONE HCL 1 MG/ML IJ SOLN
1.0000 mg | Freq: Once | INTRAMUSCULAR | Status: AC
  Administered 2018-09-07: 1 mg via INTRAVENOUS
  Filled 2018-09-07: qty 1

## 2018-09-07 MED ORDER — LORAZEPAM 2 MG/ML IJ SOLN: 0.0000 mg | Freq: Four times a day (QID) | INTRAMUSCULAR | Status: DC

## 2018-09-07 MED ORDER — METOPROLOL TARTRATE 5 MG/5ML IV SOLN
5.0000 mg | Freq: Three times a day (TID) | INTRAVENOUS | Status: DC
  Administered 2018-09-07 – 2018-09-08 (×3): 5 mg via INTRAVENOUS
  Filled 2018-09-07 (×3): qty 5

## 2018-09-07 MED ORDER — LORAZEPAM 2 MG/ML IJ SOLN: 0.0000 mg | Freq: Two times a day (BID) | INTRAMUSCULAR | Status: DC

## 2018-09-07 MED ORDER — NICOTINE 21 MG/24HR TD PT24
21.0000 mg | MEDICATED_PATCH | Freq: Every day | TRANSDERMAL | Status: DC
  Administered 2018-09-07 – 2018-09-08 (×2): 21 mg via TRANSDERMAL
  Filled 2018-09-07 (×2): qty 1

## 2018-09-07 MED ORDER — HYDRALAZINE HCL 20 MG/ML IJ SOLN
5.0000 mg | INTRAMUSCULAR | Status: DC | PRN
  Administered 2018-09-07: 5 mg via INTRAVENOUS
  Filled 2018-09-07: qty 1

## 2018-09-07 MED ORDER — LORAZEPAM 2 MG/ML IJ SOLN: 1.0000 mg | Freq: Four times a day (QID) | INTRAMUSCULAR | Status: DC | PRN

## 2018-09-07 MED ORDER — SODIUM CHLORIDE 0.9 % IV BOLUS (SEPSIS)
1000.0000 mL | Freq: Once | INTRAVENOUS | Status: AC
  Administered 2018-09-07: 1000 mL via INTRAVENOUS

## 2018-09-07 MED ORDER — SENNOSIDES-DOCUSATE SODIUM 8.6-50 MG PO TABS: 1.0000 | ORAL_TABLET | Freq: Every evening | ORAL | Status: DC | PRN

## 2018-09-07 MED ORDER — FOLIC ACID 1 MG PO TABS
1.0000 mg | ORAL_TABLET | Freq: Every day | ORAL | Status: DC
  Administered 2018-09-07 – 2018-09-08 (×2): 1 mg via ORAL
  Filled 2018-09-07 (×2): qty 1

## 2018-09-07 MED ORDER — SUCRALFATE 1 GM/10ML PO SUSP
1.0000 g | Freq: Three times a day (TID) | ORAL | Status: DC
  Administered 2018-09-07 – 2018-09-08 (×6): 1 g via ORAL
  Filled 2018-09-07 (×8): qty 10

## 2018-09-07 MED ORDER — ADULT MULTIVITAMIN W/MINERALS CH: 1.0000 | ORAL_TABLET | Freq: Every day | ORAL | Status: DC

## 2018-09-07 MED ORDER — ONDANSETRON HCL 4 MG PO TABS: 4.0000 mg | ORAL_TABLET | Freq: Four times a day (QID) | ORAL | Status: DC | PRN

## 2018-09-07 MED ORDER — ONDANSETRON HCL 4 MG/2ML IJ SOLN: 4.0000 mg | Freq: Four times a day (QID) | INTRAMUSCULAR | Status: DC | PRN

## 2018-09-07 NOTE — Progress Notes (Signed)
[facility] Progress Note  Subjective: no new c/o, abdomen still hurting. Was just admitted earlier this am (no charge)  Vitals:   09/07/18 0151 09/07/18 0207 09/07/18 0533 09/07/18 1000  BP: (!) 176/92 (!) 174/114 (!) 157/118 (!) 157/91  Pulse: 61 76 76 80  Resp: 20 19 18 18   Temp: 97.8 F (36.6 C) 97.8 F (36.6 C) 98.1 F (36.7 C) 98.6 F (37 C)  TempSrc: Oral Oral  Oral  SpO2: 100% 100% 100% 99%  Weight:      Height:        Inpatient medications: . folic acid  1 mg Oral Daily  . LORazepam  0-4 mg Intravenous Q6H   Followed by  . [START ON 09/09/2018] LORazepam  0-4 mg Intravenous Q12H  . multivitamin with minerals  1 tablet Oral Daily  . nicotine  21 mg Transdermal Daily  . pantoprazole  40 mg Intravenous Q12H  . sucralfate  1 g Oral TID WC & HS  . thiamine  100 mg Oral Daily   Or  . thiamine  100 mg Intravenous Daily   . sodium chloride 125 mL/hr at 09/07/18 1010   hydrALAZINE, HYDROmorphone (DILAUDID) injection, LORazepam **OR** LORazepam, ondansetron **OR** ondansetron (ZOFRAN) IV, oxyCODONE, senna-docusate, zolpidem  Exam: Alert no distress No jvd Chest cta bilat, no rales or wheezing Cor RRR no mrg Abd mild diffuse tenderness, +BS, nondistended GU defer Ext no edema or joint changes Neuro NF, ox3   Presentation Summary: Steven Li is a 52 y.o. male with medical history significant of polysubstance abuse (alcohol, tobacco and marijuana), alcoholic pancreatitis, hypertension, hyperlipidemia, GERD, migraine headaches, peptic ulcer disease, who presents with nausea vomiting and abdominal pain. Patient states that his abdominal pain started today, which is diffused pain, constant, sharp, 10 out of 10 severity, nonradiating.  It is associated with nausea and vomiting.  He vomited once with a little blood in the vomitus.  No diarrhea.  Patient also noted a little blood in the stool.  Denies chest pain, shortness breath, cough, fever or chills.  No symptoms  of UTI or unilateral weakness.  Patient states that he is not taking any medications currently at home. ED Course: pt was found to have lipase 690, WBC 6.1, renal function normal, abnormal liver function (ALP 105, AST 800, ALT 286, total bilirubin 0.8), temperature normal, no tachycardia, oxygen saturation 97% on room air.  CT abdomen/pelvis showed acute pancreatitis without pseudocyst formation or necrosis.  Patient is placed on MedSurg bed for observation.  Home meds:  - MVI/ pantoprazole 20 bid/ sucralfate 1gm qid       Hospital Problems/ Course:  Alcoholic pancreatitis: lipase 690.  CT abdomen/pelvis showed acute pancreatitis/ stranding without pseudocyst formation or necrosis. -med-surg bed for observation -NPO for pancreatitis -IVF: 2 LNS and then at 125 cc/hr -IV dilaudid for pain control, IV zofran for nausea -IV protonix for possible alcoholic gastritis -check triglyceride level  Tobacco abuse and Alcohol abuse: -Did counseling about importance of quitting smoking -Nicotine patch -Did counseling about the importance of quitting drinking -CIWA protocol  History of GI bleed and hx of PUD: Patient states that she noted a little blood in the vomitus and stool.  Hemoglobin 13.9 on 06/16/2018--> 12.9. -IV Protonix 40 mg twice daily -Carafate -type and cross -repeat CBC in aM  GERD: -on Protonix IV  Abnormal LFTs/ alcohlic liver damage: Patient had a negative hepatitis panel work-up last year. ALP 105, AST 800, ALT 286, total bilirubin 0.8.  The  ratio of AST/ALT is typical for alcoholic liver damage. Check INR.  -Avoid using liver toxic medication, such as Tylenol  HTN: Blood pressure 173/113.  Patient has not taken medications at home.  Elevated blood pressure may partially related to the pain. -IV hydralazine prn - start metoprolol 5mg  IV q 8hr sched    DVT ppx: SCD Code Status: Full code Family Communication: None at bed side.   Disposition Plan:  Anticipate  discharge back to previous home environment Consults called:  none Admission status:  medical floor/obs     Kelly Splinter MD [facility] pgr [phone] 09/07/2018, 2:30 PM   Recent Labs  Lab 09/06/18 2133 09/07/18 0315  NA 131* 134*  K 3.9 4.0  CL 98 103  CO2 20* 24  GLUCOSE 132* 130*  BUN 11 9  CREATININE 1.08 1.02  CALCIUM 8.6* 8.1*   Recent Labs  Lab 09/06/18 2133  AST 800*  ALT 286*  ALKPHOS 105  BILITOT 0.8  PROT 6.6  ALBUMIN 3.5   Recent Labs  Lab 09/06/18 2133 09/07/18 0315  WBC 6.1 10.7*  NEUTROABS 3.0  --   HGB 12.9* 12.7*  HCT 40.1 38.7*  MCV 93.3 92.4  PLT 311 271   Iron/TIBC/Ferritin/ %Sat    Component Value Date/Time   IRON 213 (H) 10/04/2016 1313   TIBC 175 (L) 10/04/2016 1313   FERRITIN 4,018 (H) 10/06/2016 0925   IRONPCTSAT 122 (H) 10/04/2016 1313

## 2018-09-07 NOTE — Progress Notes (Signed)
New Admission Note:  Arrival Method: By bed from ED around 0230 Mental Orientation: Alert and oriented Telemetry: None Assessment: Completed Skin: Completed, refer to flowsheets IV: Right forearm Pain: Abdominal pain 10/10 Tubes: None Safety Measures: Safety Fall Prevention Plan was given, discussed  Admission: Completed [facility] Orientation: Patient has been orientated to the room, unit and the staff. Family: None  Orders have been reviewed and implemented. Will continue to monitor the patient. Call light has been placed within reach and bed alarm has been activated.   Perry Mount, RN  Phone Number: [phone]

## 2018-09-07 NOTE — H&P (Signed)
History and Physical    [name]:[id number] DOB: December 06, 1965 DOA: 09/06/2018  Referring MD/NP/PA:   PCP: Charlott Rakes, MD   Patient coming from:  The patient is coming from home.  At baseline, pt is independent for most of ADL.        Chief Complaint: Nausea, vomiting, abdominal pain  HPI: Steven Li is a 52 y.o. male with medical history significant of polysubstance abuse (alcohol, tobacco and marijuana), alcoholic pancreatitis, hypertension, hyperlipidemia, GERD, migraine headaches, peptic ulcer disease, who presents with nausea vomiting and abdominal pain.  Patient states that his abdominal pain started today, which is diffused pain, constant, sharp, 10 out of 10 severity, nonradiating.  It is associated with nausea and vomiting.  He vomited once with a little blood in the vomitus.  No diarrhea.  Patient also noted a little blood in the stool.  Denies chest pain, shortness breath, cough, fever or chills.  No symptoms of UTI or unilateral weakness.  Patient states that he is not taking any medications currently at home.  ED Course: pt was found to have lipase 690, WBC 6.1, renal function normal, abnormal liver function (ALP 105, AST 800, ALT 286, total bilirubin 0.8), temperature normal, no tachycardia, oxygen saturation 97% on room air.  CT abdomen/pelvis showed acute pancreatitis without pseudocyst formation or necrosis.  Patient is placed on MedSurg bed for observation.  Review of Systems:   General: no fevers, chills, no body weight gain, has poor appetite, has fatigue HEENT: no blurry vision, hearing changes or sore throat Respiratory: no dyspnea, coughing, wheezing CV: no chest pain, no palpitations GI: has nausea, vomiting, abdominal pain, no diarrhea, constipation GU: no dysuria, burning on urination, increased urinary frequency, hematuria  Ext: no leg edema Neuro: no unilateral weakness, numbness, or tingling, no vision change or hearing loss Skin: no rash, no  skin tear. MSK: No muscle spasm, no deformity, no limitation of range of movement in spin Heme: No easy bruising.  Travel history: No recent long distant travel.  Allergy: No Known Allergies  Past Medical History:  Diagnosis Date  . ETOH abuse   . Headache    "a few/year" (04/17/2018)  . High cholesterol   . Hypertension   . Migraine    "a few/year" (04/17/2018)    Past Surgical History:  Procedure Laterality Date  . NO PAST SURGERIES      Social History:  reports that he has been smoking cigarettes. He has a 33.00 pack-year smoking history. He has never used smokeless tobacco. He reports that he drinks about 84.0 standard drinks of alcohol per week. He reports that he has current or past drug history. Drug: Marijuana.  Family History:  Family History  Problem Relation Age of Onset  . Cirrhosis Mother        alcoholic  . Stomach cancer Mother   . Cirrhosis Father        alcoholic      Prior to Admission medications   Medication Sig Start Date End Date Taking? Authorizing Provider  Multiple Vitamin (MULTIVITAMIN WITH MINERALS) TABS tablet Take 1 tablet by mouth daily. Patient not taking: Reported on 09/06/2018 06/16/18   Jola Schmidt, MD  pantoprazole (PROTONIX) 20 MG tablet Take 1 tablet (20 mg total) by mouth 2 (two) times daily. Patient not taking: Reported on 09/06/2018 06/16/18   Jola Schmidt, MD  sucralfate (CARAFATE) 1 GM/10ML suspension Take 10 mLs (1 g total) by mouth 4 (four) times daily -  with meals and  at bedtime. Patient not taking: Reported on 09/06/2018 06/16/18   Jola Schmidt, MD    Physical Exam: Vitals:   09/06/18 2129 09/06/18 2135 09/06/18 2145 09/06/18 2200  BP:  (!) 163/115 (!) 168/110 (!) 172/113  Pulse:  84 80 62  Resp:  (!) 21 19 12   Temp:   98.3 F (36.8 C)   TempSrc:   Oral   SpO2:  100% 100% 99%  Weight: 59 kg     Height: 5\' 8"  (1.727 m)      General: Not in acute distress.  Dry mucous and membrane HEENT:       Eyes: PERRL, EOMI, no  scleral icterus.       ENT: No discharge from the ears and nose, no pharynx injection, no tonsillar enlargement.        Neck: No JVD, no bruit, no mass felt. Heme: No neck lymph node enlargement. Cardiac: S1/S2, RRR, No murmurs, No gallops or rubs. Respiratory:  No rales, wheezing, rhonchi or rubs. GI: Soft, nondistended, diffused tender, no rebound pain, no organomegaly, BS present. GU: No hematuria Ext: No pitting leg edema bilaterally. 2+DP/PT pulse bilaterally. Musculoskeletal: No joint deformities, No joint redness or warmth, no limitation of ROM in spin. Skin: No rashes.  Neuro: Alert, oriented X3, cranial nerves II-XII grossly intact, moves all extremities normally.  Psych: Patient is not psychotic, no suicidal or hemocidal ideation.  Labs on Admission: I have personally reviewed following labs and imaging studies  CBC: Recent Labs  Lab 09/06/18 2133  WBC 6.1  NEUTROABS 3.0  HGB 12.9*  HCT 40.1  MCV 93.3  PLT 109   Basic Metabolic Panel: Recent Labs  Lab 09/06/18 2133  NA 131*  K 3.9  CL 98  CO2 20*  GLUCOSE 132*  BUN 11  CREATININE 1.08  CALCIUM 8.6*   GFR: Estimated Creatinine Clearance: 65.3 mL/min (by C-G formula based on SCr of 1.08 mg/dL). Liver Function Tests: Recent Labs  Lab 09/06/18 2133  AST 800*  ALT 286*  ALKPHOS 105  BILITOT 0.8  PROT 6.6  ALBUMIN 3.5   Recent Labs  Lab 09/06/18 2133  LIPASE 690*   No results for input(s): AMMONIA in the last 168 hours. Coagulation Profile: No results for input(s): INR, PROTIME in the last 168 hours. Cardiac Enzymes: No results for input(s): CKTOTAL, CKMB, CKMBINDEX, TROPONINI in the last 168 hours. BNP (last 3 results) No results for input(s): PROBNP in the last 8760 hours. HbA1C: No results for input(s): HGBA1C in the last 72 hours. CBG: No results for input(s): GLUCAP in the last 168 hours. Lipid Profile: No results for input(s): CHOL, HDL, LDLCALC, TRIG, CHOLHDL, LDLDIRECT in the last 72  hours. Thyroid Function Tests: No results for input(s): TSH, T4TOTAL, FREET4, T3FREE, THYROIDAB in the last 72 hours. Anemia Panel: No results for input(s): VITAMINB12, FOLATE, FERRITIN, TIBC, IRON, RETICCTPCT in the last 72 hours. Urine analysis:    Component Value Date/Time   COLORURINE YELLOW 09/06/2018 2211   APPEARANCEUR CLEAR 09/06/2018 2211   LABSPEC 1.010 09/06/2018 2211   PHURINE 5.0 09/06/2018 2211   GLUCOSEU NEGATIVE 09/06/2018 2211   HGBUR NEGATIVE 09/06/2018 2211   BILIRUBINUR NEGATIVE 09/06/2018 2211   KETONESUR NEGATIVE 09/06/2018 2211   PROTEINUR NEGATIVE 09/06/2018 2211   UROBILINOGEN 0.2 07/07/2014 0755   NITRITE NEGATIVE 09/06/2018 2211   LEUKOCYTESUR NEGATIVE 09/06/2018 2211   Sepsis Labs: @LABRCNTIP (procalcitonin:4,lacticidven:4) )No results found for this or any previous visit (from the past 240 hour(s)).   Radiological Exams  on Admission: Ct Abdomen Pelvis W Contrast  Result Date: 09/07/2018 CLINICAL DATA:  Abdominal pain.  History of ulcers. EXAM: CT ABDOMEN AND PELVIS WITH CONTRAST TECHNIQUE: Multidetector CT imaging of the abdomen and pelvis was performed using the standard protocol following bolus administration of intravenous contrast. CONTRAST:  166mL OMNIPAQUE IOHEXOL 300 MG/ML  SOLN COMPARISON:  06/16/2018 FINDINGS: Lower chest: Lung bases are clear. No effusions. Heart is normal size. Hepatobiliary: Diffuse low-density throughout the liver compatible with fatty infiltration. Scattered hypodensities, likely small cysts. Gallbladder unremarkable. Pancreas: There is stranding around the pancreas compatible with acute pancreatitis. Slightly prominent pancreatic duct, stable. Previously seen small pancreatic pseudocysts no longer visualized. Spleen: No focal abnormality or ductal dilatation. Adrenals/Urinary Tract: Small cyst in the midpole of the left kidney, stable. No hydronephrosis. Adrenal glands and urinary bladder unremarkable. Stomach/Bowel: Normal  appendix. Stomach, large and small bowel grossly unremarkable. Vascular/Lymphatic: Aortic atherosclerosis. No enlarged abdominal or pelvic lymph nodes. Reproductive: No visible focal abnormality. Other: No free fluid or free air. Musculoskeletal: No acute bony abnormality. IMPRESSION: Stranding around the pancreas compatible with acute pancreatitis. No evidence of pancreatic necrosis. Mild fatty infiltration of the liver. Aortic atherosclerosis. Electronically Signed   By: Rolm Baptise M.D.   On: 09/07/2018 00:18     EKG:  Not done in ED, will get one.   Assessment/Plan Principal Problem:   Alcoholic pancreatitis Active Problems:   Alcohol abuse   GERD (gastroesophageal reflux disease)   Abnormal LFTs   History of GI bleed   Tobacco abuse   PUD (peptic ulcer disease)   HTN (hypertension)   Alcoholic pancreatitis: lipase 690.  CT abdomen/pelvis showed acute pancreatitis without pseudocyst formation or necrosis.  -will place on med-surg bed for observation -NPO for pancreatitis -IVF: 2 LNS and then at 125 cc/hr -IV dilaudid for pain control, IV zofran for nausea -IV protonix for possible alcoholic gastritis -check triglyceride level  Tobacco abuse and Alcohol abuse: -Did counseling about importance of quitting smoking -Nicotine patch -Did counseling about the importance of quitting drinking -CIWA protocol  History of GI bleed and hx of PUD: Patient states that she noted a little blood in the vomitus and stool.  Hemoglobin 13.9 on 06/16/2018--> 12.9. -IV Protonix 40 mg twice daily -Carafate -type and cross -repeat CBC in aM  GERD: -on Protonix IV  Abnormal LFTs: Patient had a negative hepatitis panel work-up last year. ALP 105, AST 800, ALT 286, total bilirubin 0.8.  The ratio of AST/ALT is typical for alcoholic liver damage. -Avoid using liver toxic medication, such as Tylenol  HTN: Blood pressure 173/113.  Patient has not taken medications at home.  Elevated blood pressure  may partially related to the pain. -IV hydralazine.    DVT ppx: SCD Code Status: Full code Family Communication: None at bed side.   Disposition Plan:  Anticipate discharge back to previous home environment Consults called:  none Admission status:  medical floor/obs     Date of Service 09/07/2018    Ivor Costa [facility] Pager [phone]  If 7PM-7AM, please contact night-coverage [email or web address] Password [email or web address] 09/07/2018, 1:16 AM

## 2018-09-08 DIAGNOSIS — I10 Essential (primary) hypertension: Secondary | ICD-10-CM

## 2018-09-08 LAB — COMPREHENSIVE METABOLIC PANEL
ALT: 138 U/L — ABNORMAL HIGH (ref 0–44)
AST: 160 U/L — ABNORMAL HIGH (ref 15–41)
Albumin: 3 g/dL — ABNORMAL LOW (ref 3.5–5.0)
Alkaline Phosphatase: 90 U/L (ref 38–126)
Anion gap: 11 (ref 5–15)
BUN: <5 mg/dL — ABNORMAL LOW (ref 6–20)
CHLORIDE: 100 mmol/L (ref 98–111)
CO2: 24 mmol/L (ref 22–32)
Calcium: 8.5 mg/dL — ABNORMAL LOW (ref 8.9–10.3)
Creatinine, Ser: 0.91 mg/dL (ref 0.61–1.24)
GFR calc Af Amer: >60 mL/min (ref >60)
GFR calc non Af Amer: >60 mL/min (ref >60)
Glucose, Bld: 94 mg/dL (ref 70–99)
Potassium: 3.4 mmol/L — ABNORMAL LOW (ref 3.5–5.1)
Sodium: 135 mmol/L (ref 135–145)
Total Bilirubin: 1.4 mg/dL — ABNORMAL HIGH (ref 0.3–1.2)
Total Protein: 5.6 g/dL — ABNORMAL LOW (ref 6.5–8.1)

## 2018-09-08 LAB — CBC
HEMATOCRIT: 36.9 % — AB (ref 39.0–52.0)
Hemoglobin: 12.5 g/dL — ABNORMAL LOW (ref 13.0–17.0)
MCH: 31.1 pg (ref 26.0–34.0)
MCHC: 33.9 g/dL (ref 30.0–36.0)
MCV: 91.8 fL (ref 80.0–100.0)
Platelets: 189 10*3/uL (ref 150–400)
RBC: 4.02 MIL/uL — AB (ref 4.22–5.81)
RDW: 12 % (ref 11.5–15.5)
WBC: 9.5 10*3/uL (ref 4.0–10.5)
nRBC: 0 % (ref 0.0–0.2)

## 2018-09-08 LAB — PROTIME-INR
INR: 1.04
Prothrombin Time: 13.6 seconds (ref 11.4–15.2)

## 2018-09-08 LAB — LIPASE, BLOOD: LIPASE: 722 U/L — AB (ref 11–51)

## 2018-09-08 MED ORDER — PANTOPRAZOLE SODIUM 40 MG PO TBEC: 40.0000 mg | DELAYED_RELEASE_TABLET | Freq: Two times a day (BID) | ORAL | Status: DC

## 2018-09-08 MED ORDER — AMLODIPINE BESYLATE 10 MG PO TABS: 10.0000 mg | ORAL_TABLET | Freq: Every day | ORAL | 11 refills | Status: DC

## 2018-09-08 MED ORDER — AMLODIPINE BESYLATE 5 MG PO TABS
5.0000 mg | ORAL_TABLET | Freq: Two times a day (BID) | ORAL | Status: DC
  Administered 2018-09-08: 5 mg via ORAL
  Filled 2018-09-08: qty 1

## 2018-09-08 NOTE — Care Management Note (Addendum)
Case Management Note  Patient Details  Name: Steven Li MRN: [id number] Date of Birth: 08-25-1966  Subjective/Objective:      Alcohol pancreatitis, ETOH, abn LFT              Action/Plan: Spoke to pt and states he lives with his sister and her husband. He takes the bus to his job. Works part-time at [employer] 4 days per week for 6 hours. Will need note for work. States he can afford blood pressure meds if they are less that $15-20. Pt can utilize [facility] for discount on meds. Will provide MATCH and utilize [employer].   Appt arranged at [facility] for 09/18/2018 at 2:30 pm. Provided pt with brochure.    Expected Discharge Date:                  Expected Discharge Plan:  Home/Self Care  In-House Referral:  NA  Discharge planning Services  CM Consult, [facility], Medication Assistance  Post Acute Care Choice:  NA Choice offered to:  NA  DME Arranged:  N/A DME Agency:  NA  HH Arranged:  NA HH Agency:  NA  Status of Service:  Completed, signed off  If discussed at [place] of Stay Meetings, dates discussed:    Additional Comments:  Erenest Rasher, RN 09/08/2018, 12:14 PM

## 2018-09-08 NOTE — Progress Notes (Signed)
[facility] Progress Note  Subjective: abd pain improving, still hurts but more of an ache , sharp pains resolved. Would like to eat something.  AST/ ALT down, lipase unchanged ~700. Getting OOB and walking in halls, not using CIWA.  Taking IV dilaudid 1 mg every 3h  Vitals:   09/07/18 2120 09/07/18 2151 09/08/18 0304 09/08/18 0603  BP: (!) 177/110 (!) 171/109 (!) 146/89 (!) 166/102  Pulse: 68 62 71 (!) 56  Resp:  16 16   Temp:  98.9 F (37.2 C) 98.5 F (36.9 C)   TempSrc:  Oral Oral   SpO2:  97% 99%   Weight:      Height:        Inpatient medications: . folic acid  1 mg Oral Daily  . LORazepam  0-4 mg Intravenous Q6H   Followed by  . [START ON 09/09/2018] LORazepam  0-4 mg Intravenous Q12H  . metoprolol tartrate  5 mg Intravenous Q8H  . multivitamin with minerals  1 tablet Oral Daily  . nicotine  21 mg Transdermal Daily  . pantoprazole  40 mg Intravenous Q12H  . sucralfate  1 g Oral TID WC & HS  . thiamine  100 mg Oral Daily   Or  . thiamine  100 mg Intravenous Daily   . sodium chloride 125 mL/hr at 09/07/18 1817   hydrALAZINE, HYDROmorphone (DILAUDID) injection, LORazepam **OR** LORazepam, ondansetron **OR** ondansetron (ZOFRAN) IV, oxyCODONE, senna-docusate, zolpidem  Exam: Alert no distress No jvd Chest cta bilat, no rales or wheezing Cor RRR no mrg Abd mild diffuse tenderness, +BS, nondistended GU defer Ext no edema or joint changes Neuro NF, ox3   Presentation Summary: Steven Li is a 52 y.o. male with medical history significant of polysubstance abuse (alcohol, tobacco and marijuana), alcoholic pancreatitis, hypertension, hyperlipidemia, GERD, migraine headaches, peptic ulcer disease, who presents with nausea vomiting and abdominal pain. Patient states that his abdominal pain started today, which is diffused pain, constant, sharp, 10 out of 10 severity, nonradiating.  It is associated with nausea and vomiting.  He vomited once with a little blood in  the vomitus.  No diarrhea.  Patient also noted a little blood in the stool.  Denies chest pain, shortness breath, cough, fever or chills.  No symptoms of UTI or unilateral weakness.  Patient states that he is not taking any medications currently at home. ED Course: pt was found to have lipase 690, WBC 6.1, renal function normal, abnormal liver function (ALP 105, AST 800, ALT 286, total bilirubin 0.8), temperature normal, no tachycardia, oxygen saturation 97% on room air.  CT abdomen/pelvis showed acute pancreatitis without pseudocyst formation or necrosis.  Patient is placed on MedSurg bed for observation.  Home meds:  - pantoprazole 20 bid/ sucralfate 1gm qid       Hospital Problems/ Course:  Alcoholic pancreatitis: lipase 690.  CT abdomen/pelvis showed mild acute pancreatitis/ stranding without pseudocyst formation or necrosis. - pain improving, will start on clear liquids - dec IVF to 100 cc/hr - cont IV meds for pain and nausea - IV protonix for possible alcoholic gastritis   Tobacco abuse and Alcohol abuse: -Did counseling about importance of quitting smoking -Nicotine patch -Did counseling about the importance of quitting drinking, dc CIWA  History of GI bleed and hx of PUD: Patient states that she noted a little blood in the vomitus and stool.  Hemoglobin 13.9 on 06/16/2018--> 12.9. - change protonix to po bid x 2d then qd - Hb stable 12's  GERD: -on Protonix IV  Abnormal LFTs/ alcoholic acute hepatitis  - mild acute etoh hepatitis, AST/ALT declining, Tbili up slightly but still <2.Patient had a negative hepatitis panel work-up last year - Avoid using liver toxic medication, such as Tylenol  - check PT/INR.   HTN: BP's still up.  Patient not on medications at home. BP's still high despite pain improving - IV hydralazine prn - dc IV metoprolol - will start on norvasc 5 bid    DVT ppx: SCD Code Status: Full code Family Communication: None at bed side.    Disposition Plan:  Anticipate discharge back to previous home environment Consults called:  none Admission status: inpatient, Ulanda Edison MD [facility] pgr [phone] 09/08/2018, 9:18 AM   Recent Labs  Lab 09/06/18 2133 09/07/18 0315 09/08/18 0517  NA 131* 134* 135  K 3.9 4.0 3.4*  CL 98 103 100  CO2 20* 24 24  GLUCOSE 132* 130* 94  BUN 11 9 <5*  CREATININE 1.08 1.02 0.91  CALCIUM 8.6* 8.1* 8.5*   Recent Labs  Lab 09/06/18 2133 09/08/18 0517  AST 800* 160*  ALT 286* 138*  ALKPHOS 105 90  BILITOT 0.8 1.4*  PROT 6.6 5.6*  ALBUMIN 3.5 3.0*   Recent Labs  Lab 09/06/18 2133 09/07/18 0315 09/08/18 0517  WBC 6.1 10.7* 9.5  NEUTROABS 3.0  --   --   HGB 12.9* 12.7* 12.5*  HCT 40.1 38.7* 36.9*  MCV 93.3 92.4 91.8  PLT 311 271 189   Iron/TIBC/Ferritin/ %Sat    Component Value Date/Time   IRON 213 (H) 10/04/2016 1313   TIBC 175 (L) 10/04/2016 1313   FERRITIN 4,018 (H) 10/06/2016 0925   IRONPCTSAT 122 (H) 10/04/2016 1313

## 2018-09-08 NOTE — Discharge Summary (Addendum)
Physician Discharge Summary  Patient ID: KAO MIESSE MRN: [id number] DOB/AGE: 54-06-1966 54 y.o.  Admit date: 09/06/2018 Discharge date: 09/08/2018  Admission Diagnoses:   Abdominal pain   Acute pancreatitis   Alcohol abuse   Abnormal LFTs   Tobacco abuse   HTN (hypertension)  Discharge Diagnoses:    Acute pancreatitis   Alcohol abuse   Abnormal LFTs   Tobacco abuse   HTN (hypertension)  Discharged Condition: good  Presentation Summary: Steven Li a 52 y.o.malewith medical history significant ofpolysubstance abuse (alcohol, tobacco and marijuana),alcoholic pancreatitis, hypertension, hyperlipidemia, GERD, migraine headaches, peptic ulcer disease, who presents with nausea vomiting and abdominal pain. Patient states that his abdominal pain started today, which is diffused pain, constant, sharp, 10 out of 10 severity, nonradiating. It is associated with nausea andvomiting. He vomited once with a little blood in the vomitus. No diarrhea. Patient also noted a little blood in the stool. Denies chest pain, shortness breath, cough, fever or chills. No symptoms of UTI or unilateral weakness. Patient states that he is not taking any medications currently at home. ED Course:pt was found to have lipase 690, WBC 6.1, renal function normal, abnormal liver function (ALP 105, AST 800, ALT 286, total bilirubin 0.8), temperature normal, no tachycardia, oxygen saturation 97% on room air. CT abdomen/pelvis showed acute pancreatitis without pseudocyst formation or necrosis. Patient is placed on MedSurg bed for observation.  Hospital Problems/ Course:  Alcoholic pancreatitis: w/ abd pain/ N/V. Lipase 690.CT abdomen/pelvis showed mild acute pancreatitis/ without pseudocyst formation or necrosis.  Abd pain and nausea resolved over 24-36 hr of NPO status and IVF"s, patient taking clears w/o difficulty and wants to go home. Counseled extensively about dangers of ETOH abuse and the variety  of health problems it can cause. OK for dc.   Tobacco abuse and Alcohol abuse: -Did counseling about importance of quitting smoking -Nicotine patch -Did counseling about the importance of quitting drinking, dc CIWA  History of GI bleedand hx of WUJ:[id number] states that she noted a little blood in the vomitus and stool. Hemoglobin 13.9 on 06/16/2018-->12.9. - Hb stable 12's, no evidence clinically of GIB   Abnormal LFTs/ alcoholic acute hepatitis  - mild acute etoh hepatitis, improving - Avoid using liver toxic medication, such as Tylenol - no more ETOH!  HTN:BP's up here, no hx BP medication.   - started norvasc 10 mg qd here  - Needs PCP, he has info for the Int Medicine clinic and will get an appt.    Discharge Exam: Blood pressure (!) 154/95, pulse 63, temperature 97.8 F (36.6 C), temperature source Oral, resp. rate 18, height 5\' 8"  (1.727 m), weight 59 kg, SpO2 99 %. Alert no distress No jvd Chest cta bilat, no rales or wheezing Cor RRR no mrg Abd mild diffuse tenderness, +BS, nondistended GU defer Ext no edema or joint changes Neuro NF, ox3  Disposition: Discharge disposition: 01-Home or Self Care        Allergies as of 09/08/2018   No Known Allergies     Medication List    STOP taking these medications   pantoprazole 20 MG tablet Commonly known as:  PROTONIX   sucralfate 1 GM/10ML suspension Commonly known as:  CARAFATE     TAKE these medications   amLODipine 10 MG tablet Commonly known as:  NORVASC Take 1 tablet (10 mg total) by mouth daily.   multivitamin with minerals Tabs tablet Take 1 tablet by mouth daily.        Signed:  Leland Pouch Ezma Rehm 09/08/2018, 3:36 PM

## 2018-09-18 ENCOUNTER — Inpatient Hospital Stay: Payer: Self-pay

## 2018-09-20 ENCOUNTER — Emergency Department (HOSPITAL_COMMUNITY)
Admission: EM | Admit: 2018-09-20 | Discharge: 2018-09-20 | Disposition: A | Discharge status: Home / Self Care | Payer: No Typology Code available for payment source | Attending: Emergency Medicine | Admitting: Emergency Medicine

## 2018-09-20 ENCOUNTER — Emergency Department (HOSPITAL_COMMUNITY): Payer: No Typology Code available for payment source

## 2018-09-20 ENCOUNTER — Encounter (HOSPITAL_COMMUNITY): Payer: Self-pay | Admitting: *Deleted

## 2018-09-20 ENCOUNTER — Other Ambulatory Visit: Payer: Self-pay

## 2018-09-20 DIAGNOSIS — R079 Chest pain, unspecified: Secondary | ICD-10-CM

## 2018-09-20 DIAGNOSIS — I10 Essential (primary) hypertension: Secondary | ICD-10-CM | POA: Insufficient documentation

## 2018-09-20 DIAGNOSIS — M25532 Pain in left wrist: Secondary | ICD-10-CM | POA: Insufficient documentation

## 2018-09-20 DIAGNOSIS — Y92410 Unspecified street and highway as the place of occurrence of the external cause: Secondary | ICD-10-CM | POA: Insufficient documentation

## 2018-09-20 DIAGNOSIS — F17210 Nicotine dependence, cigarettes, uncomplicated: Secondary | ICD-10-CM | POA: Insufficient documentation

## 2018-09-20 DIAGNOSIS — Z79899 Other long term (current) drug therapy: Secondary | ICD-10-CM | POA: Diagnosis not present

## 2018-09-20 MED ORDER — DICLOFENAC SODIUM 1 % TD GEL: 4.0000 g | Freq: Four times a day (QID) | TRANSDERMAL | 0 refills | Status: DC

## 2018-09-20 MED ORDER — METHOCARBAMOL 500 MG PO TABS
500.0000 mg | ORAL_TABLET | Freq: Once | ORAL | Status: AC
  Administered 2018-09-20: 500 mg via ORAL
  Filled 2018-09-20: qty 1

## 2018-09-20 MED ORDER — METHOCARBAMOL 500 MG PO TABS: 500.0000 mg | ORAL_TABLET | Freq: Every evening | ORAL | 0 refills | Status: DC | PRN

## 2018-09-20 NOTE — ED Notes (Signed)
Patient transported to X-ray 
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
## 2018-11-18 ENCOUNTER — Encounter (HOSPITAL_COMMUNITY): Payer: Self-pay | Admitting: Emergency Medicine

## 2018-11-18 ENCOUNTER — Other Ambulatory Visit: Payer: Self-pay

## 2018-11-18 ENCOUNTER — Inpatient Hospital Stay (HOSPITAL_COMMUNITY)
Admission: EM | Admit: 2018-11-18 | Discharge: 2018-11-19 | DRG: 439 | Discharge status: Against Medical Advice | Payer: Self-pay | Attending: Oncology | Admitting: Oncology

## 2018-11-18 ENCOUNTER — Emergency Department (HOSPITAL_COMMUNITY): Payer: Self-pay

## 2018-11-18 DIAGNOSIS — K852 Alcohol induced acute pancreatitis without necrosis or infection: Principal | ICD-10-CM | POA: Diagnosis present

## 2018-11-18 DIAGNOSIS — K860 Alcohol-induced chronic pancreatitis: Secondary | ICD-10-CM | POA: Diagnosis present

## 2018-11-18 DIAGNOSIS — G43909 Migraine, unspecified, not intractable, without status migrainosus: Secondary | ICD-10-CM | POA: Diagnosis present

## 2018-11-18 DIAGNOSIS — Z5329 Procedure and treatment not carried out because of patient's decision for other reasons: Secondary | ICD-10-CM | POA: Diagnosis present

## 2018-11-18 DIAGNOSIS — E7800 Pure hypercholesterolemia, unspecified: Secondary | ICD-10-CM | POA: Diagnosis present

## 2018-11-18 DIAGNOSIS — I10 Essential (primary) hypertension: Secondary | ICD-10-CM | POA: Diagnosis present

## 2018-11-18 DIAGNOSIS — Z800 Family history of malignant neoplasm of digestive organs: Secondary | ICD-10-CM

## 2018-11-18 DIAGNOSIS — F17210 Nicotine dependence, cigarettes, uncomplicated: Secondary | ICD-10-CM | POA: Diagnosis present

## 2018-11-18 DIAGNOSIS — F10188 Alcohol abuse with other alcohol-induced disorder: Secondary | ICD-10-CM | POA: Diagnosis present

## 2018-11-18 LAB — URINALYSIS, ROUTINE W REFLEX MICROSCOPIC
Bilirubin Urine: NEGATIVE
Glucose, UA: NEGATIVE mg/dL
Hgb urine dipstick: NEGATIVE
Ketones, ur: NEGATIVE mg/dL
Leukocytes,Ua: NEGATIVE
Nitrite: NEGATIVE
Protein, ur: NEGATIVE mg/dL
Specific Gravity, Urine: 1.008 (ref 1.005–1.030)
pH: 5 (ref 5.0–8.0)

## 2018-11-18 LAB — COMPREHENSIVE METABOLIC PANEL
ALT: 154 U/L — ABNORMAL HIGH (ref 0–44)
AST: 305 U/L — ABNORMAL HIGH (ref 15–41)
Albumin: 3.2 g/dL — ABNORMAL LOW (ref 3.5–5.0)
Alkaline Phosphatase: 84 U/L (ref 38–126)
Anion gap: 16 — ABNORMAL HIGH (ref 5–15)
BUN: 13 mg/dL (ref 6–20)
CALCIUM: 8.1 mg/dL — AB (ref 8.9–10.3)
CO2: 19 mmol/L — ABNORMAL LOW (ref 22–32)
Chloride: 101 mmol/L (ref 98–111)
Creatinine, Ser: 1.02 mg/dL (ref 0.61–1.24)
GFR calc Af Amer: >60 mL/min (ref >60)
GFR calc non Af Amer: >60 mL/min (ref >60)
Glucose, Bld: 123 mg/dL — ABNORMAL HIGH (ref 70–99)
Potassium: 3.5 mmol/L (ref 3.5–5.1)
Sodium: 136 mmol/L (ref 135–145)
Total Bilirubin: 0.9 mg/dL (ref 0.3–1.2)
Total Protein: 6.1 g/dL — ABNORMAL LOW (ref 6.5–8.1)

## 2018-11-18 LAB — LIPASE, BLOOD: Lipase: 174 U/L — ABNORMAL HIGH (ref 11–51)

## 2018-11-18 LAB — CBC WITH DIFFERENTIAL/PLATELET
Abs Immature Granulocytes: 0.03 10*3/uL (ref 0.00–0.07)
Basophils Absolute: 0.1 10*3/uL (ref 0.0–0.1)
Basophils Relative: 1 %
EOS PCT: 3 %
Eosinophils Absolute: 0.1 10*3/uL (ref 0.0–0.5)
HEMATOCRIT: 38 % — AB (ref 39.0–52.0)
Hemoglobin: 12.6 g/dL — ABNORMAL LOW (ref 13.0–17.0)
Immature Granulocytes: 1 %
Lymphocytes Relative: 29 %
Lymphs Abs: 1.5 10*3/uL (ref 0.7–4.0)
MCH: 30.8 pg (ref 26.0–34.0)
MCHC: 33.2 g/dL (ref 30.0–36.0)
MCV: 92.9 fL (ref 80.0–100.0)
Monocytes Absolute: 0.6 10*3/uL (ref 0.1–1.0)
Monocytes Relative: 12 %
Neutro Abs: 2.9 10*3/uL (ref 1.7–7.7)
Neutrophils Relative %: 54 %
Platelets: 250 10*3/uL (ref 150–400)
RBC: 4.09 MIL/uL — ABNORMAL LOW (ref 4.22–5.81)
RDW: 12.6 % (ref 11.5–15.5)
WBC: 5.2 10*3/uL (ref 4.0–10.5)
nRBC: 0 % (ref 0.0–0.2)

## 2018-11-18 LAB — ETHANOL: Alcohol, Ethyl (B): 103 mg/dL — ABNORMAL HIGH (ref <10)

## 2018-11-18 MED ORDER — SODIUM CHLORIDE 0.9% FLUSH: 3.0000 mL | Freq: Two times a day (BID) | INTRAVENOUS | Status: DC

## 2018-11-18 MED ORDER — FAMOTIDINE IN NACL 20-0.9 MG/50ML-% IV SOLN
20.0000 mg | INTRAVENOUS | Status: DC
  Administered 2018-11-19: 20 mg via INTRAVENOUS
  Filled 2018-11-18: qty 50

## 2018-11-18 MED ORDER — HYDROMORPHONE HCL 1 MG/ML IJ SOLN
1.0000 mg | INTRAMUSCULAR | Status: DC | PRN
  Administered 2018-11-18: 1 mg via INTRAVENOUS
  Filled 2018-11-18 (×2): qty 1

## 2018-11-18 MED ORDER — LORAZEPAM 2 MG/ML IJ SOLN
1.0000 mg | Freq: Four times a day (QID) | INTRAMUSCULAR | Status: DC | PRN
  Administered 2018-11-18: 1 mg via INTRAVENOUS
  Filled 2018-11-18: qty 1

## 2018-11-18 MED ORDER — SODIUM CHLORIDE 0.9 % IV BOLUS
1000.0000 mL | Freq: Once | INTRAVENOUS | Status: AC
  Administered 2018-11-18: 1000 mL via INTRAVENOUS

## 2018-11-18 MED ORDER — SODIUM CHLORIDE 0.9% FLUSH: 3.0000 mL | INTRAVENOUS | Status: DC | PRN

## 2018-11-18 MED ORDER — WHITE PETROLATUM EX OINT
TOPICAL_OINTMENT | CUTANEOUS | Status: AC
  Administered 2018-11-18: 0.2
  Filled 2018-11-18: qty 28.35

## 2018-11-18 MED ORDER — SODIUM CHLORIDE 0.9 % IV SOLN: 250.0000 mL | INTRAVENOUS | Status: DC | PRN

## 2018-11-18 MED ORDER — VITAMIN B-1 100 MG PO TABS
100.0000 mg | ORAL_TABLET | Freq: Every day | ORAL | Status: DC
  Administered 2018-11-19: 100 mg via ORAL
  Filled 2018-11-18: qty 1

## 2018-11-18 MED ORDER — NICOTINE 21 MG/24HR TD PT24
21.0000 mg | MEDICATED_PATCH | Freq: Every day | TRANSDERMAL | Status: DC
  Administered 2018-11-18 – 2018-11-19 (×2): 21 mg via TRANSDERMAL
  Filled 2018-11-18 (×3): qty 1

## 2018-11-18 MED ORDER — FENTANYL CITRATE (PF) 100 MCG/2ML IJ SOLN
25.0000 ug | Freq: Once | INTRAMUSCULAR | Status: AC
  Administered 2018-11-18: 25 ug via INTRAVENOUS
  Filled 2018-11-18: qty 2

## 2018-11-18 MED ORDER — ONDANSETRON HCL 4 MG/2ML IJ SOLN
4.0000 mg | Freq: Once | INTRAMUSCULAR | Status: AC
  Administered 2018-11-18: 4 mg via INTRAVENOUS
  Filled 2018-11-18: qty 2

## 2018-11-18 MED ORDER — ENOXAPARIN SODIUM 40 MG/0.4ML ~~LOC~~ SOLN
40.0000 mg | SUBCUTANEOUS | Status: DC
  Administered 2018-11-18: 40 mg via SUBCUTANEOUS
  Filled 2018-11-18 (×2): qty 0.4

## 2018-11-18 MED ORDER — SODIUM CHLORIDE 0.9 % IV SOLN
INTRAVENOUS | Status: DC
  Administered 2018-11-18 – 2018-11-19 (×2): via INTRAVENOUS

## 2018-11-18 MED ORDER — ONDANSETRON HCL 4 MG/2ML IJ SOLN
4.0000 mg | Freq: Four times a day (QID) | INTRAMUSCULAR | Status: DC | PRN
  Administered 2018-11-18: 4 mg via INTRAVENOUS
  Filled 2018-11-18: qty 2

## 2018-11-18 MED ORDER — MORPHINE SULFATE (PF) 4 MG/ML IV SOLN
4.0000 mg | Freq: Once | INTRAVENOUS | Status: DC
  Administered 2018-11-18: 4 mg via INTRAVENOUS
  Filled 2018-11-18: qty 1

## 2018-11-18 MED ORDER — HYDROMORPHONE HCL 1 MG/ML IJ SOLN
1.0000 mg | INTRAMUSCULAR | Status: DC | PRN
  Administered 2018-11-18: 1 mg via INTRAVENOUS
  Filled 2018-11-18: qty 1

## 2018-11-18 MED ORDER — FAMOTIDINE IN NACL 20-0.9 MG/50ML-% IV SOLN
20.0000 mg | Freq: Once | INTRAVENOUS | Status: AC
  Administered 2018-11-18: 20 mg via INTRAVENOUS
  Filled 2018-11-18: qty 50

## 2018-11-18 MED ORDER — IOHEXOL 300 MG/ML  SOLN
100.0000 mL | Freq: Once | INTRAMUSCULAR | Status: AC | PRN
  Administered 2018-11-18: 100 mL via INTRAVENOUS

## 2018-11-18 MED ORDER — THIAMINE HCL 100 MG/ML IJ SOLN
100.0000 mg | Freq: Every day | INTRAMUSCULAR | Status: DC
  Administered 2018-11-18: 100 mg via INTRAVENOUS
  Filled 2018-11-18 (×2): qty 2

## 2018-11-18 MED ORDER — HYDROMORPHONE HCL 1 MG/ML IJ SOLN: 1.0000 mg | INTRAMUSCULAR | Status: DC | PRN

## 2018-11-18 MED ORDER — LORAZEPAM 1 MG PO TABS: 1.0000 mg | ORAL_TABLET | Freq: Four times a day (QID) | ORAL | Status: DC | PRN

## 2018-11-18 NOTE — ED Notes (Signed)
Patient transported to CT 

## 2018-11-18 NOTE — ED Triage Notes (Signed)
Pt arrived via [facility] from home c/o abd pain
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
## 2018-11-18 NOTE — ED Provider Notes (Signed)
[facility] EMERGENCY DEPARTMENT Provider Note   CSN: [id number] Arrival date & time: 11/18/18  1110     History   Chief Complaint Chief Complaint  Patient presents with  . Abdominal Pain    HPI Steven Li is a 53 y.o. male with a past medical history of hypertension, alcohol abuse, pancreatitis who presents to ED for middle abdominal pain, several episodes of nonbloody, nonbilious emesis since today.  He does admit to drinking a sixpack of beer yesterday.  Denies any changes to bowel movements.  Patient states that "I already answered all these questions for those people and I do not want to do it again."  He is constantly requesting something for pain.  HPI  Past Medical History:  Diagnosis Date  . ETOH abuse   . Headache    "a few/year" (04/17/2018)  . High cholesterol   . Hypertension   . Migraine    "a few/year" (04/17/2018)    Patient Active Problem List   Diagnosis Date Noted  . HTN (hypertension) 09/07/2018  . Pancreatitis, alcoholic, acute 26/37/8588  . Pancreatic pseudocyst 04/17/2018  . Tobacco abuse 04/17/2018  . Iron overload 10/28/2016  . Hyperbilirubinemia 10/03/2016  . Elevated troponin 10/03/2016  . Anemia   . Abnormal LFTs   . Acute upper GI bleed   . Encephalopathy, hepatic ([place])   . Anemia due to blood loss, acute   . Hepatitis 10/01/2016  . Alcohol abuse 10/01/2016  . Aortic atherosclerosis ([place]) 10/01/2016  . Transaminitis 09/30/2016  . Colitis     Past Surgical History:  Procedure Laterality Date  . NO PAST SURGERIES          Home Medications    Prior to Admission medications   Medication Sig Start Date End Date Taking? Authorizing Provider  amLODipine (NORVASC) 10 MG tablet Take 1 tablet (10 mg total) by mouth daily. 09/08/18 09/08/19  Roney Jaffe, MD  diclofenac sodium (VOLTAREN) 1 % GEL Apply 4 g topically 4 (four) times daily. 09/20/18   Maczis, Barth Kirks, PA-C  methocarbamol (ROBAXIN) 500 MG tablet  Take 1 tablet (500 mg total) by mouth at bedtime as needed for muscle spasms. 09/20/18   Maczis, Barth Kirks, PA-C  Multiple Vitamin (MULTIVITAMIN WITH MINERALS) TABS tablet Take 1 tablet by mouth daily. Patient not taking: Reported on 09/06/2018 06/16/18   Jola Schmidt, MD    Family History Family History  Problem Relation Age of Onset  . Cirrhosis Mother        alcoholic  . Stomach cancer Mother   . Cirrhosis Father        alcoholic     Social History Social History   Tobacco Use  . Smoking status: Current Every Day Smoker    Packs/day: 1.00    Years: 33.00    Pack years: 33.00    Types: Cigarettes  . Smokeless tobacco: Never Used  Substance Use Topics  . Alcohol use: Yes    Alcohol/week: 84.0 standard drinks    Types: 84 Cans of beer per week    Comment: 04/17/2018 "12 pack of beer qd"  . Drug use: Yes    Types: Marijuana    Comment: 04/17/2018 "qd"     Allergies   Patient has no known allergies.   Review of Systems Review of Systems  Constitutional: Negative for appetite change, chills and fever.  HENT: Negative for ear pain, rhinorrhea, sneezing and sore throat.   Eyes: Negative for photophobia and visual disturbance.  Respiratory: Negative for cough, chest tightness, shortness of breath and wheezing.   Cardiovascular: Negative for chest pain and palpitations.  Gastrointestinal: Positive for abdominal pain, nausea and vomiting. Negative for blood in stool, constipation and diarrhea.  Genitourinary: Negative for dysuria, hematuria and urgency.  Musculoskeletal: Negative for myalgias.  Skin: Negative for rash.  Neurological: Negative for dizziness, weakness and light-headedness.     Physical Exam Updated Vital Signs BP (!) 109/95   Pulse 87   Temp (!) 97.5 F (36.4 C) (Oral)   Resp 18   SpO2 92%   Physical Exam Vitals signs and nursing note reviewed.  Constitutional:      General: He is not in acute distress.    Appearance: He is well-developed.    HENT:     Head: Normocephalic and atraumatic.     Nose: Nose normal.  Eyes:     General: No scleral icterus.       Left eye: No discharge.     Conjunctiva/sclera: Conjunctivae normal.  Neck:     Musculoskeletal: Normal range of motion and neck supple.  Cardiovascular:     Rate and Rhythm: Normal rate and regular rhythm.     Heart sounds: Normal heart sounds. No murmur. No friction rub. No gallop.   Pulmonary:     Effort: Pulmonary effort is normal. No respiratory distress.     Breath sounds: Normal breath sounds.  Abdominal:     General: Bowel sounds are normal. There is no distension.     Palpations: Abdomen is soft.     Tenderness: There is abdominal tenderness in the epigastric area and periumbilical area. There is no guarding.  Musculoskeletal: Normal range of motion.  Skin:    General: Skin is warm and dry.     Findings: No rash.  Neurological:     Mental Status: He is alert.     Motor: No abnormal muscle tone.     Coordination: Coordination normal.      ED Treatments / Results  Labs (all labs ordered are listed, but only abnormal results are displayed) Labs Reviewed  COMPREHENSIVE METABOLIC PANEL - Abnormal; Notable for the following components:      Result Value   CO2 19 (*)    Glucose, Bld 123 (*)    Calcium 8.1 (*)    Total Protein 6.1 (*)    Albumin 3.2 (*)    AST 305 (*)    ALT 154 (*)    Anion gap 16 (*)    All other components within normal limits  LIPASE, BLOOD - Abnormal; Notable for the following components:   Lipase 174 (*)    All other components within normal limits  CBC WITH DIFFERENTIAL/PLATELET - Abnormal; Notable for the following components:   RBC 4.09 (*)    Hemoglobin 12.6 (*)    HCT 38.0 (*)    All other components within normal limits  URINALYSIS, ROUTINE W REFLEX MICROSCOPIC - Abnormal; Notable for the following components:   Color, Urine STRAW (*)    All other components within normal limits  ETHANOL - Abnormal; Notable for the  following components:   Alcohol, Ethyl (B) 103 (*)    All other components within normal limits    EKG None  Radiology Ct Abdomen Pelvis W Contrast  Result Date: 11/18/2018 CLINICAL DATA:  Abdominal pain. Concern for pancreatitis. EXAM: CT ABDOMEN AND PELVIS WITH CONTRAST TECHNIQUE: Multidetector CT imaging of the abdomen and pelvis was performed using the standard protocol following bolus administration  of intravenous contrast. CONTRAST:  144mL OMNIPAQUE IOHEXOL 300 MG/ML  SOLN COMPARISON:  CT 09/06/2018 FINDINGS: Lower chest: Lung bases are clear. Hepatobiliary: No focal hepatic lesion. No biliary duct dilatation. Gallbladder is normal. Common bile duct is normal. Pancreas: There is fluid along the head, body, and tail of the pancreas. No significant pancreatic duct dilatation. The pancreas enhances uniformly without evidence of ductal interruption or non vascularization. There is edema through the neck of the pancreas. No organized fluid collections. No pancreatic duct stone. No common bile duct stone identified by CT imaging.NO cholelithiasis. Spleen: Normal spleen Adrenals/urinary tract: Adrenal glands and kidneys are normal. The ureters and bladder normal. Stomach/Bowel: Stomach, small bowel, appendix, and cecum are normal. The colon and rectosigmoid colon are normal. Vascular/Lymphatic: Abdominal aorta is normal caliber with atherosclerotic calcification. There is no retroperitoneal or periportal lymphadenopathy. No pelvic lymphadenopathy. Reproductive: Prostate normal Other: No free fluid. Musculoskeletal: No aggressive osseous lesion. IMPRESSION: 1. Acute pancreatitis. No organized fluid collections. Findings similar to CT 09/07/2018 2. No evidence of pancreatic necrosis. 3. No cholelithiasis or choledocholithiasis by CT imaging. Electronically Signed   By: Suzy Bouchard M.D.   On: 11/18/2018 15:56    Procedures Procedures (including critical care time)  Medications Ordered in  ED Medications  sodium chloride 0.9 % bolus 1,000 mL (0 mLs Intravenous Stopped 11/18/18 1257)  ondansetron (ZOFRAN) injection 4 mg (4 mg Intravenous Given 11/18/18 1141)  famotidine (PEPCID) IVPB 20 mg premix (0 mg Intravenous Stopped 11/18/18 1257)  fentaNYL (SUBLIMAZE) injection 25 mcg (25 mcg Intravenous Given 11/18/18 1256)  fentaNYL (SUBLIMAZE) injection 25 mcg (25 mcg Intravenous Given 11/18/18 1413)  sodium chloride 0.9 % bolus 1,000 mL (1,000 mLs Intravenous New Bag/Given 11/18/18 1414)  iohexol (OMNIPAQUE) 300 MG/ML solution 100 mL (100 mLs Intravenous Contrast Given 11/18/18 1523)     Initial Impression / Assessment and Plan / ED Course  I have reviewed the triage vital signs and the nursing notes.  Pertinent labs & imaging results that were available during my care of the patient were reviewed by me and considered in my medical decision making (see chart for details).     53 year old male with a past medical history of hypertension, alcohol abuse, pancreatitis presents to ED for abdominal pain, emesis that began today.  He does admit to drinking a sixpack of beer yesterday.  Lab work significant for lipase of 174, elevation in LFTs.  Alcohol level is 103.  Urinalysis is unremarkable.  CBC is unremarkable.  CT of the pelvis shows findings consistent with acute pancreatitis with no complications.  Patient was given fluids, fentanyl x2 and morphine here with no improvement in his pain.  He is requesting admission for IV fluids and pain control.  Will consult for admission.  His vital signs remained within normal limits.  IMTS to admit.    Portions of this note were generated with [employer]. Dictation errors may occur despite best attempts at proofreading.   Final Clinical Impressions(s) / ED Diagnoses   Final diagnoses:  Alcohol-induced acute pancreatitis without infection or necrosis    ED Discharge Orders    None       Delia Heady, PA-C 11/18/18 1615     Hayden Rasmussen, MD 11/18/18 1758

## 2018-11-18 NOTE — Progress Notes (Signed)
Patient admitted to [facility] room 13. Dx: alcoholic pancreatitis. Patient reports pain continues, but better than before. Aware of the need to sop drinking alcohol. IV fluids infusing. Oriented to room. Call bell in reach.

## 2018-11-19 ENCOUNTER — Encounter (HOSPITAL_COMMUNITY): Payer: Self-pay | Admitting: General Practice

## 2018-11-19 ENCOUNTER — Other Ambulatory Visit: Payer: Self-pay

## 2018-11-19 DIAGNOSIS — Z5321 Procedure and treatment not carried out due to patient leaving prior to being seen by health care provider: Secondary | ICD-10-CM

## 2018-11-19 LAB — COMPREHENSIVE METABOLIC PANEL
ALT: 154 U/L — ABNORMAL HIGH (ref 0–44)
AST: 273 U/L — ABNORMAL HIGH (ref 15–41)
Albumin: 3.3 g/dL — ABNORMAL LOW (ref 3.5–5.0)
Alkaline Phosphatase: 95 U/L (ref 38–126)
Anion gap: 14 (ref 5–15)
BUN: <5 mg/dL — ABNORMAL LOW (ref 6–20)
CO2: 21 mmol/L — ABNORMAL LOW (ref 22–32)
Calcium: 8 mg/dL — ABNORMAL LOW (ref 8.9–10.3)
Chloride: 102 mmol/L (ref 98–111)
Creatinine, Ser: 0.9 mg/dL (ref 0.61–1.24)
GFR calc Af Amer: >60 mL/min (ref >60)
Glucose, Bld: 117 mg/dL — ABNORMAL HIGH (ref 70–99)
Potassium: 3.9 mmol/L (ref 3.5–5.1)
Sodium: 137 mmol/L (ref 135–145)
Total Bilirubin: 1.9 mg/dL — ABNORMAL HIGH (ref 0.3–1.2)
Total Protein: 5.9 g/dL — ABNORMAL LOW (ref 6.5–8.1)

## 2018-11-19 LAB — CBC
HEMATOCRIT: 36.6 % — AB (ref 39.0–52.0)
Hemoglobin: 12.6 g/dL — ABNORMAL LOW (ref 13.0–17.0)
MCH: 31 pg (ref 26.0–34.0)
MCHC: 34.4 g/dL (ref 30.0–36.0)
MCV: 90.1 fL (ref 80.0–100.0)
Platelets: 231 10*3/uL (ref 150–400)
RBC: 4.06 MIL/uL — ABNORMAL LOW (ref 4.22–5.81)
RDW: 12.3 % (ref 11.5–15.5)
WBC: 9.2 10*3/uL (ref 4.0–10.5)
nRBC: 0 % (ref 0.0–0.2)

## 2018-11-19 MED ORDER — BOOST / RESOURCE BREEZE PO LIQD CUSTOM
1.0000 | Freq: Three times a day (TID) | ORAL | Status: DC
  Administered 2018-11-19 (×2): 1 via ORAL

## 2018-11-19 MED ORDER — CHLORDIAZEPOXIDE HCL 10 MG PO CAPS: 10.0000 mg | ORAL_CAPSULE | Freq: Three times a day (TID) | ORAL | Status: DC

## 2018-11-19 MED ORDER — HYDROMORPHONE HCL 1 MG/ML IJ SOLN: 1.0000 mg | Freq: Four times a day (QID) | INTRAMUSCULAR | Status: DC | PRN

## 2018-11-19 NOTE — Discharge Summary (Signed)
Name: Steven Li MRN: [id number] DOB: Sep 23, 1966 53 y.o. PCP: Charlott Rakes, MD  Date of Admission: 11/18/2018 11:10 AM Date of Discharge: 11/19/2018 Attending Physician: Murriel Hopper, MD  Discharge Diagnosis: 1. Alcoholic Pancreatitis  Discharge Medications: Allergies as of 11/19/2018   No Known Allergies     Medication List    ASK your doctor about these medications   amLODipine 10 MG tablet Commonly known as:  NORVASC Take 1 tablet (10 mg total) by mouth daily.   bismuth subsalicylate 409 WJ/19JY suspension Commonly known as:  PEPTO BISMOL Take 30 mLs by mouth every 6 (six) hours as needed for indigestion.   diclofenac sodium 1 % Gel Commonly known as:  VOLTAREN Apply 4 g topically 4 (four) times daily.   methocarbamol 500 MG tablet Commonly known as:  ROBAXIN Take 1 tablet (500 mg total) by mouth at bedtime as needed for muscle spasms.   multivitamin with minerals Tabs tablet Take 1 tablet by mouth daily.       Disposition and follow-up:   Mr.Keonta E Mikami was discharged from [facility] in [place] condition.  At the hospital follow up visit please address:  1. Alcoholic Pancreatitis:  - Left AMA after progressing to clear liquid diet - Consider checking Lipase to see resolution if continuing to have abdominal symptoms  2. Labs / imaging needed at time of follow-up: None  3. Pending labs/ test needing follow-up: None  Follow-up Appointments: Follow-up [facility] Follow up.   Why:  December 02, 2018 at 2:30 pm  Contact information: [place] [address] [phone]          Hospital Course by problem list: 1. Alcoholic Pancreatitis:  Mr.Steven Li is a 53 yo M w/ PMH of alcohol use disorder presenting with abdominal pain, nausea, and vomiting. He presented with alcohol level >100 and elevated lipase of 174. He also had elevated AST (>300), and ALT  (>150). CT abdomen was performed showing acute pancreatitis without fluid collections or evidence of necrosis. He was made NPO, started on IV fluids and given pain control with dilaudid. Next day, all of his presenting symptoms resolved and he transitioned to clear liquid diet. Before he continued to progress his diet, he signed AMA and left his hospital room due to home emergency requiring immediate attention.   Discharge Vitals:   BP (!) 130/94 (BP Location: Right Arm)   Pulse 78   Temp 98.6 F (37 C) (Oral)   Resp 16   Ht 5\' 9"  (1.753 m)   Wt 52.3 kg   SpO2 95%   BMI 17.03 kg/m   Pertinent Labs, Studies, and Procedures:  CMP Latest Ref Rng & Units 11/19/2018 11/18/2018 09/08/2018  Glucose 70 - 99 mg/dL 117(H) 123(H) 94  BUN 6 - 20 mg/dL <5(L) 13 <5(L)  Creatinine 0.61 - 1.24 mg/dL 0.90 1.02 0.91  Sodium 135 - 145 mmol/L 137 136 135  Potassium 3.5 - 5.1 mmol/L 3.9 3.5 3.4(L)  Chloride 98 - 111 mmol/L 102 101 100  CO2 22 - 32 mmol/L 21(L) 19(L) 24  Calcium 8.9 - 10.3 mg/dL 8.0(L) 8.1(L) 8.5(L)  Total Protein 6.5 - 8.1 g/dL 5.9(L) 6.1(L) 5.6(L)  Total Bilirubin 0.3 - 1.2 mg/dL 1.9(H) 0.9 1.4(H)  Alkaline Phos 38 - 126 U/L 95 84 90  AST 15 - 41 U/L 273(H) 305(H) 160(H)  ALT 0 - 44 U/L 154(H) 154(H) 138(H)    CT  ABDOMEN PELVIS W CONTRAST IMPRESSION: 1. Acute pancreatitis. No organized fluid collections. Findings similar to CT 09/07/2018 2. No evidence of pancreatic necrosis. 3. No cholelithiasis or choledocholithiasis by CT imaging.  Discharge Instructions:   Signed: Mosetta Anis, MD 11/20/2018, 2:15 PM   Pager: [phone]

## 2018-11-19 NOTE — Progress Notes (Signed)
Steven Li requested I reach out to his workplace to describe his estimated length of stay. Spoke with his manager, Steven Li [phone]). Informed her that he is stable but will require multiple days of inpatient stay. She expressed understanding.

## 2018-11-19 NOTE — Care Management Note (Addendum)
Case Management Note  Patient Details  Name: Steven Li MRN: [id number] Date of Birth: 04-Mar-1966  Subjective/Objective:                    Action/Plan:  Patient agreeable to establish care at  [facility]. Scheduled appointment for December 02, 2018 at 2:30 pm  Expected Discharge Date:                  Expected Discharge Plan:  Home/Self Care  In-House Referral:     Discharge planning Services  CM Consult  Post Acute Care Choice:    Choice offered to:  Patient  DME Arranged:  N/A DME Agency:  NA  HH Arranged:    [place] Agency:  NA  Status of Service:  In process, will continue to follow  If discussed at Long Length of Stay Meetings, dates discussed:    Additional Comments:  Marilu Favre, RN 11/19/2018, 11:16 AM

## 2018-11-19 NOTE — Progress Notes (Signed)
   Subjective: Mr.Wyndham is a 53 yo M w/ PMH of alcohol use disorder and pancreatitis admit for acute alcoholic pancreatitis on hosp day 1.  Mr.Caras was observed and evaluated at bedside this AM. He was observed resting comfortably in bed. He states his abdominal pain has improved significantly. Currently endorsing some soreness but able to tolerate abdominal exam. He states he had some ice chips without nausea or vomiting. Able to urinate well. No bowel movements but passing gas. He expresses wishes for alcohol abstinence but states he is worried about prolonged hospital stay due to recently starting a new job and not wanting to be away from his job for too long. Discussed that hospital team will reach out to employer for excuse from work.  Objective:  Vital signs in last 24 hours: Vitals:   11/18/18 1841 11/18/18 2046 11/19/18 0452 11/19/18 0626  BP: 129/87 128/90 (!) 130/94   Pulse: 82 87 89 78  Resp:  15 16   Temp: 98.7 F (37.1 C) 98.8 F (37.1 C) 98.6 F (37 C)   TempSrc: Oral Oral Oral   SpO2: 100% 95% (!) 88% 95%  Weight:      Height:       Physical Exam  Constitutional: He is oriented to person, place, and time and well-developed, well-nourished, and in no distress. No distress.  Neck: Normal range of motion. Neck supple.  Cardiovascular: Normal rate, regular rhythm, normal heart sounds and intact distal pulses.  No murmur heard. Pulmonary/Chest: Effort normal and breath sounds normal. He has no rales.  Abdominal: Soft. Bowel sounds are normal. There is abdominal tenderness (peri-umbilical tenderness to deep palpation). There is no guarding.  Musculoskeletal: Normal range of motion.        General: No edema.  Neurological: He is alert and oriented to person, place, and time.  Skin: Skin is warm and dry. He is not diaphoretic.   Assessment/Plan:  Active Problems:   Pancreatitis  Mr.Kinnick is a 53 yo M w/ PMH of EtOH use disorder, hypertension, and prior hx of pancreatitis  admit for acute pancreatitis. He appears significantly improved compared to yesterday with good pain control. Will attempt to advance diet.   Abdominal pain 2/2 acute alcoholic pancreatitis Significantly less tender abdomen on exam. Required '1mg'$  Dilaudid q8 hours overnight. Subjectively feeling better. Will trial oral intake. - Clear liquid diet - Advance diet as tolerated - C/w Fluid NS IV 125cc/hr - Dilaudid '1mg'$  q6hr PRN for pain - Zofran '4mg'$  q6hr PRN for nausea  Transaminitis 2/2 alcohol consumption ALT 273, ALT 154, Alk Phos 95, TB 1.9, Improving with fluid resuscitation - Trend CMPs - Avoid hepatotoxic meds  Alcohol Use Disorder 6 pack daily beer drinker. Expressing desire for abstinence. Will need to be monitored for withdrawal symptoms. CIWA has been at 0 overnight. Expected to start having worsening symptoms tonight. - CIWA w/ Ativan - Thiamine, Folate supplementation - Will need naltrexone at discharge  DVT prophx: Lovenox Diet: Clears Code: Full  Dispo: Anticipated discharge in approximately 2 day(s).   Mosetta Anis, MD 11/19/2018, 10:22 AM Pager: [phone]

## 2018-11-19 NOTE — Progress Notes (Signed)
Patient's IV removed. AMA paperwork signed.
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
## 2018-11-22 NOTE — Discharge Instructions (Signed)
Pancreatitis Eating Plan  Pancreatitis is when your pancreas becomes irritated and swollen (inflamed). The pancreas is a small organ located behind your stomach. It helps your body digest food and regulate your blood sugar. Pancreatitis can affect how your body digests food, especially foods with fat. You may also have other symptoms such as abdominal pain or nausea.  When you have pancreatitis, following a low-fat eating plan may help you manage symptoms and recover more quickly. Work with your health care provider or a diet and nutrition specialist (dietitian) to create an eating plan that is right for you.  What are tips for following this plan?  Reading food labels  Use the information on food labels to help keep track of how much fat you eat:   Check the serving size.   Look for the amount of total fat in grams (g) in one serving.  ? Low-fat foods have 3 g of fat or less per serving.  ? Fat-free foods have 0.5 g of fat or less per serving.   Keep track of how much fat you eat based on how many servings you eat.  ? For example, if you eat two servings, the amount of fat you eat will be two times what is listed on the label.  Shopping     Buy low-fat or nonfat foods, such as:  ? Fresh, frozen, or canned fruits and vegetables.  ? Grains, including pasta, bread, and rice.  ? Lean meat, poultry, fish, and other protein foods.  ? Low-fat or nonfat dairy.   Avoid buying bakery products and other sweets made with whole milk, butter, and eggs.   Avoid buying snack foods with added fat, such as anything with butter or cheese flavoring.  Cooking   Remove skin from poultry, and remove extra fat from meat.   Limit the amount of fat and oil you use to 6 teaspoons or less per day.   Cook using low-fat methods, such as boiling, broiling, grilling, steaming, or baking.   Use spray oil to cook. Add fat-free chicken broth to add flavor and moisture.   Avoid adding cream to thicken soups or sauces. Use other thickeners  such as corn starch or tomato paste.  Meal planning     Eat a low-fat diet as told by your dietitian. For most people, this means having no more than 55-65 grams of fat each day.   Eat small, frequent meals throughout the day. For example, you may have 5-6 small meals instead of 3 large meals.   Drink enough fluid to keep your urine pale yellow.   Do not drink alcohol. Talk to your health care provider if you need help stopping.   Limit how much caffeine you have, including black coffee, black and green tea, caffeinated soft drinks, and energy drinks.  General information   Let your health care provider or dietitian know if you have unplanned weight loss on this eating plan.   You may be instructed to follow a clear liquid diet during a flare of symptoms. Talk with your health care provider about how to manage your diet during symptoms of a flare.   Take any vitamins or supplements as told by your health care provider.   Work with a dietitian, especially if you have other conditions such as obesity or diabetes mellitus.  What foods should I avoid?  Fruits  Fried fruits. Fruits served with butter or cream.  Vegetables  Fried vegetables. Vegetables cooked with butter, cheese, or   cream.  Grains  Biscuits, waffles, donuts, pastries, and croissants. Pies and cookies. Butter-flavored popcorn. Regular crackers.  Meats and other protein foods  Fatty cuts of meat. Poultry with skin. Organ meats. Bacon, sausage, and cold cuts. Whole eggs. Nuts and nut butters.  Dairy  Whole and 2% milk. Whole milk yogurt. Whole milk ice cream. Cream and half-and-half. Cream cheese. Sour cream. Cheese.  Beverages  Wine, beer, and liquor.  The items listed above may not be a complete list of foods and beverages to avoid. Contact a dietitian for more information.  Summary   Pancreatitis can affect how your body digests food, especially foods with fat.   When you have pancreatitis, it is recommended that you follow a low-fat eating  plan to help you recover more quickly and manage symptoms. For most people, this means limiting fat to no more than 55-65 grams per day.   Do not drink alcohol. Limit the amount of caffeine you have, and drink enough fluid to keep your urine pale yellow.  This information is not intended to replace advice given to you by your health care provider. Make sure you discuss any questions you have with your health care provider.  Document Released: 12/30/2017 Document Revised: 12/30/2017 Document Reviewed: 12/30/2017  [employer]vier Interactive Patient Education  2019 [employer]

## 2018-12-02 ENCOUNTER — Inpatient Hospital Stay: Payer: Self-pay | Admitting: Family Medicine

## 2019-01-27 ENCOUNTER — Ambulatory Visit: Payer: Self-pay

## 2019-01-29 ENCOUNTER — Ambulatory Visit: Payer: Self-pay | Attending: Nurse Practitioner | Admitting: Nurse Practitioner

## 2019-01-29 ENCOUNTER — Other Ambulatory Visit: Payer: Self-pay

## 2019-01-29 ENCOUNTER — Encounter: Payer: Self-pay | Admitting: Nurse Practitioner

## 2019-01-29 DIAGNOSIS — I700 Atherosclerosis of aorta: Secondary | ICD-10-CM

## 2019-01-29 DIAGNOSIS — K860 Alcohol-induced chronic pancreatitis: Secondary | ICD-10-CM

## 2019-01-29 DIAGNOSIS — I10 Essential (primary) hypertension: Secondary | ICD-10-CM

## 2019-01-29 DIAGNOSIS — K7290 Hepatic failure, unspecified without coma: Secondary | ICD-10-CM

## 2019-01-29 DIAGNOSIS — F1020 Alcohol dependence, uncomplicated: Secondary | ICD-10-CM | POA: Insufficient documentation

## 2019-01-29 MED ORDER — AMLODIPINE BESYLATE 10 MG PO TABS: 10.0000 mg | ORAL_TABLET | Freq: Every day | ORAL | 1 refills | Status: DC

## 2019-01-29 NOTE — Progress Notes (Signed)
Virtual Visit via Telephone Note  I connected with Steven Li on 01/29/19  at   8:30 AM EDT  EDT by telephone and verified that I am speaking with the correct person using two identifiers.   Consent I discussed the limitations, risks, security and privacy concerns of performing an evaluation and management service by telephone and the availability of in person appointments. I also discussed with the patient that there may be a patient responsible charge related to this service. The patient expressed understanding and agreed to proceed.   Location of Patient: Private  Home   Location of Provider: [place] [facility] [employer] Office    Persons participating in Telemedicine visit: Geryl Rankins FNP-BC [place]    History of Present Illness: Telemedicine visit for: Establish care He has a history of ETOH abuse, HPL, HTN, Migraines, acute UGI bleed,  Transaminitis, alcohol induced pancreatitis as well as alcoholic hepatitis w/o ascites. 2-D echo revealed EF of 16%, grade 2 diastolic dysfunction.   He has not had any alcohol intake or smoke marijuana in over 2 months.  He goes to the [facility] which is a mental health and substance abuse center. Hepatitis panel negative (2018).    Essential Hypertension He had his sister check his blood pressure during the telemedicine call.  Blood pressure reading was 123/92. He is taking amlodipine 61m as prescribed. Denies chest pain, shortness of breath, palpitations, lightheadedness, dizziness, headaches or BLE edema.   BP Readings from Last 3 Encounters:  11/19/18 (!) 130/94  09/20/18 (!) 134/96  09/08/18 (!) 154/95    History of alcoholic pancreatitis Patient was admitted to the hospital on 2-12 and discharged on 2-13.  He was seen in the hospital with symptoms of abdominal pain, nausea and vomiting, alcohol level greater than 100, lipase 174, AST greater than 300, and ALT greater than 150.  CT of abdomen showed  acute pancreatitis without fluid collection or evidence of necrosis.  He was treated with IV fluids and Dilaudid.  Symptoms resolved within 24 hours however prior to advancing diet from clear liquids patient signed AMA and left the hospital.  Today he states he was sent home from work by his employer a few days ago from work with complaints of abdominal pain which is now resolved and he is requesting a letter to return to work.  He denies any fever, chills, abdominal pain, nausea vomiting, productive cough, shortness of breath, headaches, or chest pain.  Past Medical History:  Diagnosis Date  . ETOH abuse   . Headache    "a few/year" (04/17/2018)  . High cholesterol   . Hypertension   . Migraine    "a few/year" (04/17/2018)    Past Surgical History:  Procedure Laterality Date  . NO PAST SURGERIES      Family History  Problem Relation Age of Onset  . Cirrhosis Mother        alcoholic  . Stomach cancer Mother   . Cirrhosis Father        alcoholic   . Diabetes Sister     Social History   Socioeconomic History  . Marital status: Divorced    Spouse name: Not on file  . Number of children: Not on file  . Years of education: Not on file  . Highest education level: Not on file  Occupational History  . Not on file  Social Needs  . Financial resource strain: Not on file  . Food insecurity:  Worry: Not on file    Inability: Not on file  . Transportation needs:    Medical: Not on file    Non-medical: Not on file  Tobacco Use  . Smoking status: Current Every Day Smoker    Packs/day: 1.00    Years: 33.00    Pack years: 33.00    Types: Cigarettes  . Smokeless tobacco: Never Used  Substance and Sexual Activity  . Alcohol use: Not Currently    Alcohol/week: 84.0 standard drinks    Types: 84 Cans of beer per week    Comment: 04/17/2018 "12 pack of beer qd"  . Drug use: Not Currently    Types: Marijuana    Comment: 04/17/2018 "qd"  . Sexual activity: Not Currently  Lifestyle  .  Physical activity:    Days per week: Not on file    Minutes per session: Not on file  . Stress: Not on file  Relationships  . Social connections:    Talks on phone: Not on file    Gets together: Not on file    Attends religious service: Not on file    Active member of club or organization: Not on file    Attends meetings of clubs or organizations: Not on file    Relationship status: Not on file  Other Topics Concern  . Not on file  Social History Narrative  . Not on file     Observations/Objective: Awake, alert and oriented x 3   Review of Systems  Constitutional: Negative for fever, malaise/fatigue and weight loss.  HENT: Negative.  Negative for nosebleeds.   Eyes: Negative.  Negative for blurred vision, double vision and photophobia.  Respiratory: Negative.  Negative for cough and shortness of breath.   Cardiovascular: Negative.  Negative for chest pain, palpitations and leg swelling.  Gastrointestinal: Negative.  Negative for heartburn, nausea and vomiting.  Musculoskeletal: Negative.  Negative for myalgias.  Neurological: Negative.  Negative for dizziness, focal weakness, seizures and headaches.  Psychiatric/Behavioral: Negative.  Negative for suicidal ideas.    Assessment and Plan: Steven Li was seen today for establish care.  Diagnoses and all orders for this visit:  Essential hypertension -     amLODipine (NORVASC) 10 MG tablet; Take 1 tablet (10 mg total) by mouth daily. -     CBC; Future -     CMP14+EGFR; Future -     Lipid panel; Future  Liver failure without hepatic coma, unspecified chronicity ([place]) Resolved. Patient has been advised to continue alcohol cessation.   Alcohol dependence, uncomplicated (HCC) Stable.  Patient currently is abstaining from alcohol.   Aortic atherosclerosis (HCC) Stable.   Alcohol-induced chronic pancreatitis (HCC) -     Lipase; Future   Follow Up Instructions Return in about 2 months (around 03/31/2019) for HTN.     I  discussed the assessment and treatment plan with the patient. The patient was provided an opportunity to ask questions and all were answered. The patient agreed with the plan and demonstrated an understanding of the instructions.   The patient was advised to call back or seek an in-person evaluation if the symptoms worsen or if the condition fails to improve as anticipated.  I provided 13 minutes of non-face-to-face time during this encounter including median intraservice time, reviewing previous notes, labs, imaging, medications and explaining diagnosis and management.  Gildardo Pounds, FNP-BC

## 2019-02-01 ENCOUNTER — Other Ambulatory Visit: Payer: Self-pay

## 2019-02-01 ENCOUNTER — Ambulatory Visit: Payer: Self-pay | Attending: Family Medicine

## 2019-02-01 DIAGNOSIS — K860 Alcohol-induced chronic pancreatitis: Secondary | ICD-10-CM

## 2019-02-01 DIAGNOSIS — I10 Essential (primary) hypertension: Secondary | ICD-10-CM

## 2019-02-02 LAB — LIPID PANEL
Chol/HDL Ratio: 3 ratio (ref 0.0–5.0)
Cholesterol, Total: 188 mg/dL (ref 100–199)
HDL: 62 mg/dL (ref >39)
LDL Calculated: 94 mg/dL (ref 0–99)
Triglycerides: 158 mg/dL — ABNORMAL HIGH (ref 0–149)
VLDL Cholesterol Cal: 32 mg/dL (ref 5–40)

## 2019-02-02 LAB — CMP14+EGFR
ALT: 10 IU/L (ref 0–44)
AST: 21 IU/L (ref 0–40)
Albumin/Globulin Ratio: 1.6 (ref 1.2–2.2)
Albumin: 4.4 g/dL (ref 3.8–4.9)
Alkaline Phosphatase: 121 IU/L — ABNORMAL HIGH (ref 39–117)
BUN/Creatinine Ratio: 14 (ref 9–20)
BUN: 14 mg/dL (ref 6–24)
Bilirubin Total: <0.2 mg/dL (ref 0.0–1.2)
CO2: 23 mmol/L (ref 20–29)
Calcium: 10.1 mg/dL (ref 8.7–10.2)
Chloride: 102 mmol/L (ref 96–106)
Creatinine, Ser: 1.03 mg/dL (ref 0.76–1.27)
GFR calc Af Amer: 95 mL/min/{1.73_m2} (ref >59)
GFR calc non Af Amer: 82 mL/min/{1.73_m2} (ref >59)
Globulin, Total: 2.7 g/dL (ref 1.5–4.5)
Glucose: 84 mg/dL (ref 65–99)
Potassium: 4.6 mmol/L (ref 3.5–5.2)
Sodium: 139 mmol/L (ref 134–144)
Total Protein: 7.1 g/dL (ref 6.0–8.5)

## 2019-02-02 LAB — CBC
Hematocrit: 36.6 % — ABNORMAL LOW (ref 37.5–51.0)
Hemoglobin: 12.8 g/dL — ABNORMAL LOW (ref 13.0–17.7)
MCH: 30.7 pg (ref 26.6–33.0)
MCHC: 35 g/dL (ref 31.5–35.7)
MCV: 88 fL (ref 79–97)
Platelets: 321 10*3/uL (ref 150–450)
RBC: 4.17 x10E6/uL (ref 4.14–5.80)
RDW: 11.8 % (ref 11.6–15.4)
WBC: 9.4 10*3/uL (ref 3.4–10.8)

## 2019-02-02 LAB — LIPASE: Lipase: 30 U/L (ref 13–78)

## 2019-02-07 ENCOUNTER — Other Ambulatory Visit: Payer: Self-pay | Admitting: Nurse Practitioner

## 2019-02-07 DIAGNOSIS — Z1211 Encounter for screening for malignant neoplasm of colon: Secondary | ICD-10-CM

## 2019-02-08 ENCOUNTER — Telehealth: Payer: Self-pay

## 2019-02-08 NOTE — Telephone Encounter (Signed)
CMA spoke to patient to inform on lab results and referrals.  Pt. Verified DOB. Pt. Understood.

## 2019-02-08 NOTE — Telephone Encounter (Signed)
-----   Message from Gildardo Pounds, NP sent at 02/07/2019  9:05 PM EDT ----- Labs still show anemia. Will refer you to Gastroenterology for colonoscopy and work up of anemia. Liver function is slightly elevated. Make sure you are avoiding alcohol.

## 2019-02-23 ENCOUNTER — Telehealth: Payer: Self-pay | Admitting: Family Medicine

## 2019-02-23 NOTE — Telephone Encounter (Signed)
Informed patient of message.  

## 2019-02-23 NOTE — Telephone Encounter (Signed)
Patient called to check on the status of their colonoscopy referral. Please follow up

## 2019-02-23 NOTE — Telephone Encounter (Signed)
5/4   Pt do not have insurance I send a letter to patient to apply for the [facility] discount CAFA with application  to be refer to a Gi specialist.

## 2019-03-26 ENCOUNTER — Telehealth: Payer: Self-pay | Admitting: Family Medicine

## 2019-03-26 NOTE — Telephone Encounter (Signed)
LVM to informed Pt that am returning the documents he provide to [facility] since most of the documents can not be used or be reading, also attach a copy of the CAFA application as well high light what he need to submitted when he make an appt with financial

## 2019-03-31 ENCOUNTER — Other Ambulatory Visit: Payer: Self-pay

## 2019-03-31 ENCOUNTER — Ambulatory Visit: Payer: Self-pay | Attending: Family Medicine | Admitting: Family Medicine

## 2019-03-31 ENCOUNTER — Encounter: Payer: Self-pay | Admitting: Family Medicine

## 2019-03-31 DIAGNOSIS — B86 Scabies: Secondary | ICD-10-CM

## 2019-03-31 DIAGNOSIS — Z1211 Encounter for screening for malignant neoplasm of colon: Secondary | ICD-10-CM

## 2019-03-31 DIAGNOSIS — I10 Essential (primary) hypertension: Secondary | ICD-10-CM

## 2019-03-31 DIAGNOSIS — Z720 Tobacco use: Secondary | ICD-10-CM

## 2019-03-31 MED ORDER — PERMETHRIN 5 % EX CREA: 1.0000 "application " | TOPICAL_CREAM | Freq: Once | CUTANEOUS | 1 refills | Status: AC

## 2019-03-31 NOTE — Progress Notes (Signed)
Patient has been called and DOB has been verified. Patient has been screened and transferred to PCP to start phone visit.     

## 2019-03-31 NOTE — Progress Notes (Signed)
Virtual Visit via Telephone Note  I connected with Steven Li, on 03/31/2019 at 10:55 AM by telephone due to the COVID-19 pandemic and verified that I am speaking with the correct person using two identifiers.   Consent: I discussed the limitations, risks, security and privacy concerns of performing an evaluation and management service by telephone and the availability of in person appointments. I also discussed with the patient that there may be a patient responsible charge related to this service. The patient expressed understanding and agreed to proceed.   Location of Patient: Home  Location of Provider: Clinic   Persons participating in Telemedicine visit: Arshdeep Bolger [place]-CMA Dr. Felecia Shelling     History of Present Illness: 53 year old male with a history of tobacco abuse, previous alcohol abuse, hypertension who presents today for follow-up visit. He quit alcohol 6 months ago and has continued to abstain.  Also informs me he quit THC in the past but he continues to smoke half a pack of cigarettes per day and is not ready to give this up yet.  He endorses compliance with his antihypertensive and denies adverse effects from his medication. Today he complains of generalized pruritus which occurs more at night and has been present for the last 1 month.  He has not noticed any skin lesions and has changed his discharge and call to no avail.  No one else she is to bed with him.  Denies allergies to foods, soaps or creams. Denies chest pain, dyspnea, paroxysmal nocturnal dyspnea, pedal edema  Past Medical History:  Diagnosis Date  . ETOH abuse   . Headache    "a few/year" (04/17/2018)  . High cholesterol   . Hypertension   . Migraine    "a few/year" (04/17/2018)   No Known Allergies  Current Outpatient Medications on File Prior to Visit  Medication Sig Dispense Refill  . amLODipine (NORVASC) 10 MG tablet Take 1 tablet (10 mg total) by mouth daily. 90 tablet 1    No current facility-administered medications on file prior to visit.     Observations/Objective: Awake, alert, oriented x3 Not in acute distress  Assessment and Plan: 1. Essential hypertension Stable Continue amlodipine Counseled on blood pressure goal of less than 130/80, low-sodium, DASH diet, medication compliance, 150 minutes of moderate intensity exercise per week. Discussed medication compliance, adverse effects.   2. Scabies Counseled on care of beddings - permethrin (ELIMITE) 5 % cream; Apply 1 application topically once for 1 dose. From head to toe on leaving for 8 hours then wash off  Dispense: 60 g; Refill: 1  3. Screening for colon cancer Advised to apply for the [facility] financial discount however he states he did not qualify for the orange card and complains of the cumbersome process involved in the financial assistance application He would like to sign up for a payment plan so he can have his colonoscopy - Ambulatory referral to Gastroenterology  4. Tobacco abuse Spent 3 minutes counseling on cessation of tobacco and hazardous effects of tobacco use but he is not ready to quit at this time   Follow Up Instructions: 3 months   I discussed the assessment and treatment plan with the patient. The patient was provided an opportunity to ask questions and all were answered. The patient agreed with the plan and demonstrated an understanding of the instructions.   The patient was advised to call back or seek an in-person evaluation if the symptoms worsen or if the condition fails to improve as  anticipated.     I provided 15 minutes total of non-face-to-face time during this encounter including median intraservice time, reviewing previous notes, labs, imaging, medications, management and patient verbalized understanding.     Charlott Rakes, MD, FAAFP. [facility] and [place], [place]   03/31/2019, 10:55 AM

## 2019-04-05 ENCOUNTER — Telehealth: Payer: Self-pay | Admitting: Family Medicine

## 2019-04-05 NOTE — Telephone Encounter (Signed)
Patient was called and informed of medication being ready for pick up at his pharmacy.

## 2019-04-05 NOTE — Telephone Encounter (Signed)
New Message   Pt calling states that he was suppose to have a prescription called in for his itching on his legs and back to the [facility] on [address] but they states they have not received anything yet. Please f/u

## 2019-07-07 ENCOUNTER — Ambulatory Visit: Payer: Self-pay | Admitting: Family Medicine

## 2019-09-13 ENCOUNTER — Other Ambulatory Visit: Payer: Self-pay | Admitting: Nurse Practitioner

## 2019-09-13 DIAGNOSIS — I10 Essential (primary) hypertension: Secondary | ICD-10-CM

## 2019-09-15 ENCOUNTER — Other Ambulatory Visit: Payer: Self-pay | Admitting: Family Medicine

## 2019-09-15 DIAGNOSIS — I10 Essential (primary) hypertension: Secondary | ICD-10-CM

## 2019-10-11 ENCOUNTER — Other Ambulatory Visit: Payer: Self-pay | Admitting: Nurse Practitioner

## 2019-10-11 DIAGNOSIS — I10 Essential (primary) hypertension: Secondary | ICD-10-CM

## 2019-11-30 ENCOUNTER — Other Ambulatory Visit: Payer: Self-pay | Admitting: Family Medicine

## 2019-11-30 DIAGNOSIS — I10 Essential (primary) hypertension: Secondary | ICD-10-CM

## 2020-05-15 ENCOUNTER — Ambulatory Visit: Payer: Self-pay | Attending: Family Medicine | Admitting: Family Medicine

## 2020-05-15 ENCOUNTER — Encounter: Payer: Self-pay | Admitting: Family Medicine

## 2020-05-15 ENCOUNTER — Other Ambulatory Visit: Payer: Self-pay

## 2020-05-15 VITALS — BP 149/83 | HR 72 | Ht 69.0 in | Wt 127.8 lb

## 2020-05-15 DIAGNOSIS — I10 Essential (primary) hypertension: Secondary | ICD-10-CM

## 2020-05-15 DIAGNOSIS — F1010 Alcohol abuse, uncomplicated: Secondary | ICD-10-CM

## 2020-05-15 MED ORDER — AMLODIPINE BESYLATE 5 MG PO TABS: 5.0000 mg | ORAL_TABLET | Freq: Every day | ORAL | 6 refills | Status: DC

## 2020-05-15 NOTE — Progress Notes (Signed)
Patient has DMV paperwork to be filled out.  No concerns today.

## 2020-05-15 NOTE — Patient Instructions (Signed)
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
## 2020-05-16 LAB — LIPID PANEL
Chol/HDL Ratio: 1.8 ratio (ref 0.0–5.0)
Cholesterol, Total: 198 mg/dL (ref 100–199)
HDL: 112 mg/dL (ref >39)
LDL Chol Calc (NIH): 70 mg/dL (ref 0–99)
Triglycerides: 92 mg/dL (ref 0–149)
VLDL Cholesterol Cal: 16 mg/dL (ref 5–40)

## 2020-05-16 LAB — CMP14+EGFR
ALT: 14 IU/L (ref 0–44)
AST: 30 IU/L (ref 0–40)
Albumin/Globulin Ratio: 1.7 (ref 1.2–2.2)
Albumin: 4.7 g/dL (ref 3.8–4.9)
Alkaline Phosphatase: 115 IU/L (ref 48–121)
BUN/Creatinine Ratio: 16 (ref 9–20)
BUN: 13 mg/dL (ref 6–24)
Bilirubin Total: 0.3 mg/dL (ref 0.0–1.2)
CO2: 23 mmol/L (ref 20–29)
Calcium: 9.3 mg/dL (ref 8.7–10.2)
Chloride: 99 mmol/L (ref 96–106)
Creatinine, Ser: 0.79 mg/dL (ref 0.76–1.27)
GFR calc Af Amer: 116 mL/min/{1.73_m2} (ref >59)
GFR calc non Af Amer: 100 mL/min/{1.73_m2} (ref >59)
Globulin, Total: 2.8 g/dL (ref 1.5–4.5)
Glucose: 90 mg/dL (ref 65–99)
Potassium: 4.6 mmol/L (ref 3.5–5.2)
Sodium: 138 mmol/L (ref 134–144)
Total Protein: 7.5 g/dL (ref 6.0–8.5)

## 2020-05-18 ENCOUNTER — Telehealth: Payer: Self-pay

## 2020-05-18 NOTE — Telephone Encounter (Signed)
Patient was called and informed of normal results via voicemail.

## 2020-05-18 NOTE — Telephone Encounter (Signed)
-----   Message from Charlott Rakes, MD sent at 05/16/2020  1:35 PM EDT ----- Please inform the patient that labs are normal. Thank you.

## 2020-05-30 ENCOUNTER — Telehealth: Payer: Self-pay | Admitting: *Deleted

## 2020-05-30 NOTE — Telephone Encounter (Signed)
Pt walked in stating the last form Dr Margarita Rana filled out for him is not the correct form. Patient brought correct form to be filled out. Form placed in Dr Smitty Pluck box. Please call patient when completed [phone]

## 2020-06-06 ENCOUNTER — Telehealth: Payer: Self-pay

## 2020-06-06 NOTE — Telephone Encounter (Signed)
I did paper work at office visit in his presence after reviewing documentation he brought in from completion of substance abuse treatment, we were supposed to make a copy and give him the original. Two copies cannot be missing

## 2020-06-06 NOTE — Telephone Encounter (Signed)
Patient has been called and informed that paperwork will be upfront for pick up.

## 2020-06-06 NOTE — Telephone Encounter (Signed)
Patient states that paperwork was not received by DMV. States that his lawyer informed him that they did not receive paperwork.  Patient is requesting that paperwork be filled out again.

## 2020-12-05 ENCOUNTER — Other Ambulatory Visit: Payer: Self-pay | Admitting: Family Medicine

## 2020-12-05 DIAGNOSIS — I10 Essential (primary) hypertension: Secondary | ICD-10-CM

## 2020-12-05 NOTE — Telephone Encounter (Signed)
Requested medication (s) are due for refill today: yes  Requested medication (s) are on the active medication list: yes  Last refill: 11/07/2020  Notes to clinic:overdue for office visit Vm has been left for patient to contact office to schedule  Review for courtesy refill   Requested Prescriptions  Pending Prescriptions Disp Refills   amLODipine (NORVASC) 5 MG tablet [Pharmacy Med Name: AMLODIPINE BESYLATE 5MG  TABLETS] 30 tablet 6    Sig: TAKE 1 TABLET(5 MG) BY MOUTH DAILY      Cardiovascular:  Calcium Channel Blockers Failed - 12/05/2020  3:07 AM      Failed - Last BP in normal range    BP Readings from Last 1 Encounters:  05/15/20 (!) 149/83          Failed - Valid encounter within last 6 months    Recent Outpatient Visits           6 months ago Alcohol abuse   [place], Charlane Ferretti, MD   1 year ago Essential hypertension   [place], Charlane Ferretti, MD   1 year ago Essential hypertension   [place], Vernia Buff, NP   4 years ago Transaminitis   [facility] Charlott Rakes, MD

## 2021-01-08 ENCOUNTER — Other Ambulatory Visit: Payer: Self-pay | Admitting: Family Medicine

## 2021-01-08 DIAGNOSIS — I10 Essential (primary) hypertension: Secondary | ICD-10-CM

## 2021-01-08 NOTE — Telephone Encounter (Signed)
   Notes to clinic:  Courtesy refill has been giving Still no appointment scheduled    Requested Prescriptions  Pending Prescriptions Disp Refills   amLODipine (NORVASC) 5 MG tablet [Pharmacy Med Name: AMLODIPINE BESYLATE 5MG  TABLETS] 30 tablet 0    Sig: TAKE 1 TABLET(5 MG) BY MOUTH DAILY      Cardiovascular:  Calcium Channel Blockers Failed - 01/08/2021  3:07 AM      Failed - Last BP in normal range    BP Readings from Last 1 Encounters:  05/15/20 (!) 149/83          Failed - Valid encounter within last 6 months    Recent Outpatient Visits           7 months ago Alcohol abuse   [place], Charlane Ferretti, MD   1 year ago Essential hypertension   [place] Charlott Rakes, MD   1 year ago Essential hypertension   [place], Vernia Buff, NP   4 years ago Transaminitis   [facility] Charlott Rakes, MD

## 2021-01-16 ENCOUNTER — Other Ambulatory Visit: Payer: Self-pay | Admitting: Family Medicine

## 2021-01-16 DIAGNOSIS — I10 Essential (primary) hypertension: Secondary | ICD-10-CM

## 2021-08-18 ENCOUNTER — Emergency Department (HOSPITAL_COMMUNITY)
Admission: EM | Admit: 2021-08-18 | Discharge: 2021-08-18 | Disposition: A | Discharge status: Home / Self Care | Payer: Self-pay | Attending: Emergency Medicine | Admitting: Emergency Medicine

## 2021-08-18 ENCOUNTER — Emergency Department (HOSPITAL_COMMUNITY): Payer: Self-pay

## 2021-08-18 ENCOUNTER — Other Ambulatory Visit: Payer: Self-pay

## 2021-08-18 ENCOUNTER — Encounter (HOSPITAL_COMMUNITY): Payer: Self-pay | Admitting: Emergency Medicine

## 2021-08-18 DIAGNOSIS — Z79899 Other long term (current) drug therapy: Secondary | ICD-10-CM | POA: Insufficient documentation

## 2021-08-18 DIAGNOSIS — R109 Unspecified abdominal pain: Secondary | ICD-10-CM | POA: Insufficient documentation

## 2021-08-18 DIAGNOSIS — R110 Nausea: Secondary | ICD-10-CM | POA: Insufficient documentation

## 2021-08-18 DIAGNOSIS — F17210 Nicotine dependence, cigarettes, uncomplicated: Secondary | ICD-10-CM | POA: Insufficient documentation

## 2021-08-18 DIAGNOSIS — Z20822 Contact with and (suspected) exposure to covid-19: Secondary | ICD-10-CM | POA: Insufficient documentation

## 2021-08-18 DIAGNOSIS — R61 Generalized hyperhidrosis: Secondary | ICD-10-CM | POA: Insufficient documentation

## 2021-08-18 DIAGNOSIS — M791 Myalgia, unspecified site: Secondary | ICD-10-CM | POA: Insufficient documentation

## 2021-08-18 DIAGNOSIS — R509 Fever, unspecified: Secondary | ICD-10-CM | POA: Insufficient documentation

## 2021-08-18 DIAGNOSIS — R5383 Other fatigue: Secondary | ICD-10-CM | POA: Insufficient documentation

## 2021-08-18 DIAGNOSIS — R197 Diarrhea, unspecified: Secondary | ICD-10-CM | POA: Insufficient documentation

## 2021-08-18 DIAGNOSIS — I10 Essential (primary) hypertension: Secondary | ICD-10-CM | POA: Insufficient documentation

## 2021-08-18 DIAGNOSIS — R0602 Shortness of breath: Secondary | ICD-10-CM

## 2021-08-18 DIAGNOSIS — R000 Tachycardia, unspecified: Secondary | ICD-10-CM

## 2021-08-18 LAB — BASIC METABOLIC PANEL
Anion gap: 17 — ABNORMAL HIGH (ref 5–15)
BUN: 13 mg/dL (ref 6–20)
CO2: 18 mmol/L — ABNORMAL LOW (ref 22–32)
Calcium: 9.1 mg/dL (ref 8.9–10.3)
Chloride: 98 mmol/L (ref 98–111)
Creatinine, Ser: 1.06 mg/dL (ref 0.61–1.24)
GFR, Estimated: >60 mL/min (ref >60)
Glucose, Bld: 76 mg/dL (ref 70–99)
Potassium: 4.6 mmol/L (ref 3.5–5.1)
Sodium: 133 mmol/L — ABNORMAL LOW (ref 135–145)

## 2021-08-18 LAB — URINALYSIS, ROUTINE W REFLEX MICROSCOPIC
Bilirubin Urine: NEGATIVE
Glucose, UA: NEGATIVE mg/dL
Hgb urine dipstick: NEGATIVE
Ketones, ur: 5 mg/dL — AB
Leukocytes,Ua: NEGATIVE
Nitrite: NEGATIVE
Protein, ur: NEGATIVE mg/dL
Specific Gravity, Urine: 1.008 (ref 1.005–1.030)
pH: 5 (ref 5.0–8.0)

## 2021-08-18 LAB — RESP PANEL BY RT-PCR (FLU A&B, COVID) ARPGX2
Influenza A by PCR: NEGATIVE
Influenza B by PCR: NEGATIVE
SARS Coronavirus 2 by RT PCR: NEGATIVE

## 2021-08-18 LAB — CBC WITH DIFFERENTIAL/PLATELET
Abs Immature Granulocytes: 0.06 10*3/uL (ref 0.00–0.07)
Basophils Absolute: 0.1 10*3/uL (ref 0.0–0.1)
Basophils Relative: 1 %
Eosinophils Absolute: 0.1 10*3/uL (ref 0.0–0.5)
Eosinophils Relative: 1 %
HCT: 43.3 % (ref 39.0–52.0)
Hemoglobin: 14.9 g/dL (ref 13.0–17.0)
Immature Granulocytes: 1 %
Lymphocytes Relative: 20 %
Lymphs Abs: 1.8 10*3/uL (ref 0.7–4.0)
MCH: 32.6 pg (ref 26.0–34.0)
MCHC: 34.4 g/dL (ref 30.0–36.0)
MCV: 94.7 fL (ref 80.0–100.0)
Monocytes Absolute: 0.7 10*3/uL (ref 0.1–1.0)
Monocytes Relative: 8 %
Neutro Abs: 6.4 10*3/uL (ref 1.7–7.7)
Neutrophils Relative %: 69 %
Platelets: 225 10*3/uL (ref 150–400)
RBC: 4.57 MIL/uL (ref 4.22–5.81)
RDW: 12.1 % (ref 11.5–15.5)
WBC: 9.2 10*3/uL (ref 4.0–10.5)
nRBC: 0 % (ref 0.0–0.2)

## 2021-08-18 LAB — RAPID URINE DRUG SCREEN, HOSP PERFORMED
Amphetamines: NOT DETECTED
Barbiturates: NOT DETECTED
Benzodiazepines: NOT DETECTED
Cocaine: NOT DETECTED
Opiates: NOT DETECTED
Tetrahydrocannabinol: NOT DETECTED

## 2021-08-18 LAB — HEPATIC FUNCTION PANEL
ALT: 18 U/L (ref 0–44)
AST: 41 U/L (ref 15–41)
Albumin: 3.8 g/dL (ref 3.5–5.0)
Alkaline Phosphatase: 83 U/L (ref 38–126)
Bilirubin, Direct: 0.1 mg/dL (ref 0.0–0.2)
Indirect Bilirubin: 0.7 mg/dL (ref 0.3–0.9)
Total Bilirubin: 0.8 mg/dL (ref 0.3–1.2)
Total Protein: 6.8 g/dL (ref 6.5–8.1)

## 2021-08-18 LAB — TSH: TSH: 0.777 u[IU]/mL (ref 0.350–4.500)

## 2021-08-18 LAB — TROPONIN I (HIGH SENSITIVITY)
Troponin I (High Sensitivity): 6 ng/L (ref <18)
Troponin I (High Sensitivity): 8 ng/L (ref <18)

## 2021-08-18 LAB — D-DIMER, QUANTITATIVE: D-Dimer, Quant: 0.5 ug/mL-FEU (ref 0.00–0.50)

## 2021-08-18 LAB — LIPASE, BLOOD: Lipase: 25 U/L (ref 11–51)

## 2021-08-18 LAB — LACTIC ACID, PLASMA: Lactic Acid, Venous: 1.2 mmol/L (ref 0.5–1.9)

## 2021-08-18 MED ORDER — ONDANSETRON HCL 4 MG PO TABS: 4.0000 mg | ORAL_TABLET | ORAL | 0 refills | Status: DC | PRN

## 2021-08-18 MED ORDER — ACETAMINOPHEN 325 MG PO TABS
325.0000 mg | ORAL_TABLET | Freq: Once | ORAL | Status: AC
  Administered 2021-08-18: 325 mg via ORAL
  Filled 2021-08-18: qty 1

## 2021-08-18 MED ORDER — ONDANSETRON HCL 4 MG/2ML IJ SOLN
4.0000 mg | Freq: Once | INTRAMUSCULAR | Status: AC
  Administered 2021-08-18: 4 mg via INTRAVENOUS
  Filled 2021-08-18: qty 2

## 2021-08-18 MED ORDER — SODIUM CHLORIDE 0.9 % IV BOLUS
1000.0000 mL | Freq: Once | INTRAVENOUS | Status: AC
  Administered 2021-08-18: 1000 mL via INTRAVENOUS

## 2021-08-18 NOTE — ED Provider Notes (Signed)
I provided a substantive portion of the care of this patient.  I personally performed the entirety of the history for this encounter.  EKG Interpretation  Date/Time:  Saturday August 18 2021 07:57:56 EST Ventricular Rate:  144 PR Interval:  128 QRS Duration: 74 QT Interval:  294 QTC Calculation: 455 R Axis:   94 Text Interpretation: Sinus tachycardia Right atrial enlargement Rightward axis Pulmonary disease pattern Biventricular hypertrophy ST & T wave abnormality, consider inferior ischemia Abnormal ECG similar to previous with significant tachycardia and rate related changes Confirmed by Charlesetta Shanks [phone]) on 08/18/2021 8:59:06 AM   Patient reports he has had symptoms over the past couple days of body aches nausea general weakness and sweats.  He works at [employer] as a Training and development officer.  He reports he has been getting fatigued and having to take some breaks.  There are also multiple coworkers have been sick recently.  Symptoms worse today.  Patient was structured by work Freight forwarder to come to the hospital to get evaluated.  Patient is alert.  Nontoxic.  Mental status clear.  No respiratory distress at rest.  Thin but well-nourished well-developed.  Heart regular with mild tachycardia.  No gross rub murmur gallop.  Lungs are grossly clear.  No wheeze rhonchi rales.  Abdomen soft without guarding.  No peripheral edema.  Calves are soft and nontender.  Patient was significantly tachycardic on arrival.  This has improved significantly with rehydration.  Patient's history is very suggestive of viral illness.  He has tested negative for COVID and influenza.  At this time no suggestion of bacterial coinfection.  Although tachycardic, ACS low probability with normal troponin and negative D-dimer.  Agree with plan of management.   Charlesetta Shanks, MD 08/18/21 1227

## 2021-08-18 NOTE — ED Provider Notes (Signed)
[facility] EMERGENCY DEPARTMENT Provider Note   CSN: [id number] Arrival date & time: 08/18/21  0753     History Chief Complaint  Patient presents with   Shortness of Breath    Steven Li is a 55 y.o. male with no significant medical history presents with 2 days of shortness of breath, dry mouth, fever, nausea, body aches, abdominal pain, diaphoresis, diarrhea, night sweats and lack of appetite. Patient states that these symptoms came on suddenly two days ago. Patient states he works at [employer] and multiple coworkers have been out recently with unknown illness. Patient endorses smoking 1PPD since age 66 and drinking 6 beers every night when he gets off work. Patient continues that he is seen by a PCP and was taken off of his blood pressure medication 3 months ago for unknown reasons. Patient is hypertensive today on examination.    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   Essential hypertension 09/07/2018   Pancreatitis, alcoholic, acute 57/32/2025   Pancreatic pseudocyst 04/17/2018   Tobacco abuse 04/17/2018   Iron overload 10/28/2016   Hyperbilirubinemia 10/03/2016   Elevated troponin 10/03/2016   Anemia    Abnormal LFTs    Acute upper GI bleed    Encephalopathy, hepatic    Anemia due to blood loss, acute    Hepatitis 10/01/2016   Alcohol abuse 10/01/2016   Aortic atherosclerosis ([place]) 10/01/2016   Transaminitis 09/30/2016   Colitis     Past Surgical History:  Procedure Laterality Date   NO PAST  SURGERIES         Family History  Problem Relation Age of Onset   Cirrhosis Mother        alcoholic   Stomach cancer Mother    Cirrhosis Father        alcoholic    Diabetes Sister     Social History   Tobacco Use   Smoking status: Every Day    Packs/day: 1.00    Years: 33.00    Pack years: 33.00    Types: Cigarettes   Smokeless tobacco: Never  Vaping Use   Vaping Use: Never used  Substance Use Topics   Alcohol use: Not Currently    Alcohol/week: 84.0 standard drinks    Types: 84 Cans of beer per week    Comment: 04/17/2018 "12 pack of beer qd"   Drug use: Not Currently    Types: Marijuana    Comment: 04/17/2018 "qd"    Home Medications Prior to Admission medications   Medication Sig Start Date End Date Taking? Authorizing Provider  amLODipine (NORVASC) 5 MG tablet Take 1 tablet (5 mg total) by mouth daily. Must have office visit for refills 12/05/20   Charlott Rakes, MD    Allergies    Patient has no known allergies.  Review of Systems   Review of Systems  Constitutional:  Positive for appetite change and diaphoresis.  Respiratory:  Positive for shortness of breath.   Cardiovascular:  Negative for chest pain.  Gastrointestinal:  Positive for abdominal pain, diarrhea and nausea. Negative for vomiting.  Musculoskeletal:  Positive for myalgias.  Neurological:  Negative for headaches.  All other systems reviewed and are negative.  Physical Exam Updated Vital Signs BP (!) 138/104   Pulse (!) 112   Temp 99.1 F (37.3 C) (Oral)   Resp 18   SpO2 98%   Physical Exam Vitals reviewed.  Constitutional:      General: He is not in acute distress.    Appearance: He is not toxic-appearing.  HENT:     Head: Normocephalic.     Mouth/Throat:     Mouth: Mucous membranes are dry.  Eyes:     Pupils: Pupils are equal, round, and reactive to light.  Cardiovascular:     Rate and Rhythm: Regular rhythm. Tachycardia present.     Heart sounds: No murmur  heard. Pulmonary:     Effort: Pulmonary effort is normal.     Breath sounds: Normal breath sounds.  Abdominal:     Palpations: Abdomen is soft.     Tenderness: There is no abdominal tenderness.  Musculoskeletal:     Right lower leg: No edema.     Left lower leg: No edema.  Skin:    General: Skin is warm and dry.     Capillary Refill: Capillary refill takes 2 to 3 seconds.  Neurological:     General: No focal deficit present.     Mental Status: He is alert.  Psychiatric:        Mood and Affect: Mood normal.    ED Results / Procedures / Treatments   Labs (all labs ordered are listed, but only abnormal results are displayed) Labs Reviewed  BASIC METABOLIC PANEL - Abnormal; Notable for the following components:      Result Value   Sodium 133 (*)    CO2 18 (*)    Anion gap 17 (*)    All other components within normal limits  RESP PANEL BY RT-PCR (FLU A&B, COVID) ARPGX2  CBC WITH DIFFERENTIAL/PLATELET  LIPASE, BLOOD  HEPATIC FUNCTION PANEL  TROPONIN I (HIGH SENSITIVITY)    EKG EKG Interpretation  Date/Time:  Saturday August 18 2021 07:57:56 EST Ventricular Rate:  144 PR Interval:  128 QRS Duration: 74 QT Interval:  294 QTC Calculation: 455 R Axis:   94 Text Interpretation: Sinus tachycardia Right atrial enlargement Rightward axis Pulmonary disease pattern Biventricular hypertrophy ST & T wave abnormality, consider inferior ischemia Abnormal ECG similar to previous with significant tachycardia and rate related changes Confirmed by Charlesetta Shanks [id number]) on 08/18/2021 8:59:06 AM  Radiology DG Chest 2 View  Result Date: 08/18/2021 CLINICAL DATA:  Shortness of breath EXAM: CHEST - 2 VIEW COMPARISON:  09/20/2018 FINDINGS: Large lung volumes. There is no edema, consolidation, effusion, or pneumothorax. Normal heart size and mediastinal contours. IMPRESSION: Stable large volume lungs.  No acute or focal finding. Electronically Signed   By: Jorje Guild M.D.   On:  08/18/2021 08:41    Procedures Procedures   Medications Ordered in ED Medications  acetaminophen (TYLENOL) tablet 325 mg (has no administration in time range)  ondansetron (ZOFRAN) injection 4 mg (has no administration in time range)  sodium chloride 0.9 % bolus 1,000 mL (has no administration in time range)    ED Course  I have reviewed the triage vital signs and the nursing notes.  Pertinent labs & imaging results that were available during my care of the patient were reviewed by me and considered in my medical  decision making (see chart for details).    MDM Rules/Calculators/A&P                          48YOM who presents with tachycardia and shortness of breath. Cause of tachycardia does not seem to be emergent as his EKG and chest xray are without abnormalities. Patient is afebrile but states he has been running fevers at home and has been having nausea and one episode of diarrhea. Viral respiratory panel is negative for COVID and influenza. No signs of hyperthyroid are present and patient TSH is WNL. PE has been considered but patient denies recent travel, history of blood clots, history of cancer or hormone therapies, patient denies chest pain or leg/calf swelling. Patient D Dimer is negative. Doubt ACS due to negative troponin, regular EKG, no chest pain. No anemia on CBC. Patient denies drug use and urine drug screen is negative. Patient endorses 1PPD tobacco usage and 6 beers per day alcohol usage. Patient may be hypovolemic, has been treated with 1L saline bolus and ondansetron for nausea with improvement in tachycardia. On reassessment, patient states that he feels he is able to breath better and shortness of breath is no longer present. Patient hepatic function panel WNL. Patient lactic acid WNL. Patient advised to hydrate more often and lessen alcohol usage. Patient stable on discharge.   Final Clinical Impression(s) / ED Diagnoses Final diagnoses:  None    Rx / DC  Orders ED Discharge Orders     None        Azucena Cecil, [place] 08/18/21 1436    Charlesetta Shanks, MD 08/23/21 [phone]

## 2021-08-18 NOTE — ED Notes (Signed)
Called lab for add on HFT and lipase to current sample, and swab sample status - in process.

## 2021-08-18 NOTE — ED Provider Notes (Signed)
Emergency Medicine Provider Triage Evaluation Note  Steven Li , a 55 y.o. male  was evaluated in triage.  Pt complains of Fever, chills, body aches, nausea without vomiting.  No diarrhea no constipation.  No recent sick contacts that he knows of.  No sore throat, no cough.  No chest pain, no shortness of breath.  Patient does endorse heavy smoking, from 1 pack a day.  As well as beer drinking.  Patient reports COVID-vaccine x2..  Review of Systems  Positive: As above Negative: As above  Physical Exam  BP (!) 154/119 (BP Location: Right Arm)   Pulse (!) 140   Temp 98.4 F (36.9 C) (Oral)   Resp 20   SpO2 97%  Gen:   Awake, no distress   Resp:  Normal effort, overall clear breath sounds, some scattered rhonchi that clear with cough MSK:   Moves extremities without difficulty  Other:  Tachycardia with regular rhythm  Medical Decision Making  Medically screening exam initiated at 8:11 AM.  Appropriate orders placed.  [name] was informed that the remainder of the evaluation will be completed by another provider, this initial triage assessment does not replace that evaluation, and the importance of remaining in the ED until their evaluation is complete.  URI sx, malaise, body aches   Anselmo Pickler, PA-C 08/18/21 6599    Charlesetta Shanks, MD 08/18/21 [phone]

## 2021-08-18 NOTE — Discharge Instructions (Addendum)
Return to ED with any new or worsening symptoms such as consistent headache or racing heart Maximize water intake and prioritize hydration Attempt to lessen cigarette usage Attempt to lessen alcohol intake Follow up with PCP concerning discontinued high blood pressure medication as you seem to be hypertensive on your visit today

## 2021-08-18 NOTE — ED Triage Notes (Signed)
Pt reports nausea, body aches, dry mouth, fever, chills, and SOB x 2 days.  Denies cough.

## 2021-08-23 ENCOUNTER — Other Ambulatory Visit: Payer: Self-pay

## 2021-08-23 ENCOUNTER — Encounter: Payer: Self-pay | Admitting: Family Medicine

## 2021-08-23 ENCOUNTER — Ambulatory Visit: Payer: Self-pay | Attending: Family Medicine | Admitting: Family Medicine

## 2021-08-23 VITALS — BP 182/94 | HR 90 | Ht 68.0 in | Wt 126.6 lb

## 2021-08-23 DIAGNOSIS — I10 Essential (primary) hypertension: Secondary | ICD-10-CM

## 2021-08-23 DIAGNOSIS — F17210 Nicotine dependence, cigarettes, uncomplicated: Secondary | ICD-10-CM

## 2021-08-23 DIAGNOSIS — Z1211 Encounter for screening for malignant neoplasm of colon: Secondary | ICD-10-CM

## 2021-08-23 MED ORDER — AMLODIPINE BESYLATE 10 MG PO TABS
10.0000 mg | ORAL_TABLET | Freq: Every day | ORAL | 1 refills | Status: DC
Start: 2021-08-23 — End: 2021-11-14

## 2021-08-23 NOTE — Progress Notes (Signed)
Subjective:  Patient ID: Steven Li, male    DOB: [birth year]  Age: 55 y.o. MRN: [id number]  CC: Hypertension (Have not had any meds. in 5 months./)   HPI Steven Li is a 55 y.o. year old male with a history of hypertension, tobacco abuse. Last seen in the clinic in 05/2020  Interval History: His blood pressure is elevated and he has been without medications for the last 5 months. He has no chest pains, blurry vision, headaches. Not adhering to a low-sodium diet. Denies acute concerns today but just needs his refills. Smokes a pack of cigarettes per day and is not ready to quit. Past Medical History:  Diagnosis Date   ETOH abuse    Headache    "a few/year" (04/17/2018)   High cholesterol    Hypertension    Migraine    "a few/year" (04/17/2018)    Past Surgical History:  Procedure Laterality Date   NO PAST SURGERIES      Family History  Problem Relation Age of Onset   Cirrhosis Mother        alcoholic   Stomach cancer Mother    Cirrhosis Father        alcoholic    Diabetes Sister     No Known Allergies  Outpatient Medications Prior to Visit  Medication Sig Dispense Refill   ondansetron (ZOFRAN) 4 MG tablet Take 1 tablet (4 mg total) by mouth as needed for nausea or vomiting. 12 tablet 0   amLODipine (NORVASC) 5 MG tablet Take 1 tablet (5 mg total) by mouth daily. Must have office visit for refills 30 tablet 0   No facility-administered medications prior to visit.     ROS Review of Systems  Constitutional:  Negative for activity change and appetite change.  HENT:  Negative for sinus pressure and sore throat.   Eyes:  Negative for visual disturbance.  Respiratory:  Negative for cough, chest tightness and shortness of breath.   Cardiovascular:  Negative for chest pain and leg swelling.  Gastrointestinal:  Negative for abdominal distention, abdominal pain, constipation and diarrhea.  Endocrine: Negative.   Genitourinary:  Negative for dysuria.   Musculoskeletal:  Negative for joint swelling and myalgias.  Skin:  Negative for rash.  Allergic/Immunologic: Negative.   Neurological:  Negative for weakness, light-headedness and numbness.  Psychiatric/Behavioral:  Negative for dysphoric mood and suicidal ideas.    Objective:  BP (!) 182/94   Pulse 90   Ht 5\' 8"  (1.727 m)   Wt 126 lb 9.6 oz (57.4 kg)   SpO2 97%   BMI 19.25 kg/m   BP/Weight 08/23/2021 94/17/4081 01/09/8184  Systolic BP 631 497 026  Diastolic BP 94 378 83  Wt. (Lbs) 126.6 - 127.8  BMI 19.25 - 18.87      Physical Exam Constitutional:      Appearance: He is well-developed.  Cardiovascular:     Rate and Rhythm: Normal rate.     Heart sounds: Normal heart sounds. No murmur heard. Pulmonary:     Effort: Pulmonary effort is normal.     Breath sounds: Normal breath sounds. No wheezing or rales.  Chest:     Chest wall: No tenderness.  Abdominal:     General: Bowel sounds are normal. There is no distension.     Palpations: Abdomen is soft. There is no mass.     Tenderness: There is no abdominal tenderness.  Musculoskeletal:        General: Normal range of motion.  Right lower leg: No edema.     Left lower leg: No edema.  Neurological:     Mental Status: He is alert and oriented to person, place, and time.  Psychiatric:        Mood and Affect: Mood normal.   CMP Latest Ref Rng & Units 08/18/2021 05/15/2020 02/01/2019  Glucose 70 - 99 mg/dL 76 90 84  BUN 6 - 20 mg/dL 13 13 14   Creatinine 0.61 - 1.24 mg/dL 1.06 0.79 1.03  Sodium 135 - 145 mmol/L 133(L) 138 139  Potassium 3.5 - 5.1 mmol/L 4.6 4.6 4.6  Chloride 98 - 111 mmol/L 98 99 102  CO2 22 - 32 mmol/L 18(L) 23 23  Calcium 8.9 - 10.3 mg/dL 9.1 9.3 10.1  Total Protein 6.5 - 8.1 g/dL 6.8 7.5 7.1  Total Bilirubin 0.3 - 1.2 mg/dL 0.8 0.3 <0.2  Alkaline Phos 38 - 126 U/L 83 115 121(H)  AST 15 - 41 U/L 41 30 21  ALT 0 - 44 U/L 18 14 10     Lipid Panel     Component Value Date/Time   CHOL 198  05/15/2020 0932   TRIG 92 05/15/2020 0932   HDL 112 05/15/2020 0932   CHOLHDL 1.8 05/15/2020 0932   LDLCALC 70 05/15/2020 0932    CBC    Component Value Date/Time   WBC 9.2 08/18/2021 0816   RBC 4.57 08/18/2021 0816   HGB 14.9 08/18/2021 0816   HGB 12.8 (L) 02/01/2019 0921   HCT 43.3 08/18/2021 0816   HCT 36.6 (L) 02/01/2019 0921   PLT 225 08/18/2021 0816   PLT 321 02/01/2019 0921   MCV 94.7 08/18/2021 0816   MCV 88 02/01/2019 0921   MCH 32.6 08/18/2021 0816   MCHC 34.4 08/18/2021 0816   RDW 12.1 08/18/2021 0816   RDW 11.8 02/01/2019 0921   LYMPHSABS 1.8 08/18/2021 0816   MONOABS 0.7 08/18/2021 0816   EOSABS 0.1 08/18/2021 0816   BASOSABS 0.1 08/18/2021 0816    No results found for: HGBA1C  Assessment & Plan:  1. Essential hypertension Uncontrolled due to running out of medications which I have refilled He will see the clinical pharmacist at his next visit to assess his blood pressure control Counseled on blood pressure goal of less than 130/80, low-sodium, DASH diet, medication compliance, 150 minutes of moderate intensity exercise per week. Discussed medication compliance, adverse effects. - amLODipine (NORVASC) 10 MG tablet; Take 1 tablet (10 mg total) by mouth daily. Must have office visit for refills  Dispense: 90 tablet; Refill: 1  2. Screening for colon cancer - Fecal occult blood, imunochemical([facility])  3. Nicotine dependence, cigarettes, uncomplicated Spent 3 minutes counseling on smoking cessation and he is not ready to quit.   Meds ordered this encounter  Medications   amLODipine (NORVASC) 10 MG tablet    Sig: Take 1 tablet (10 mg total) by mouth daily. Must have office visit for refills    Dispense:  90 tablet    Refill:  1    Follow-up: Return in about 2 weeks (around 09/06/2021) for Blood Pressure follow-up; 3 months PCP-hypertension.       Charlott Rakes, MD, FAAFP. [facility] [place],  [place]   08/23/2021, 12:48 PM

## 2021-08-23 NOTE — Patient Instructions (Signed)

## 2021-09-10 ENCOUNTER — Telehealth: Payer: Self-pay | Admitting: Family Medicine

## 2021-09-10 NOTE — Telephone Encounter (Signed)
Lurena Joiner will be out the office. Called patient no answer. Left vm to call [phone] to reschedule.

## 2021-09-11 ENCOUNTER — Ambulatory Visit: Payer: Self-pay | Admitting: Pharmacist

## 2021-09-27 ENCOUNTER — Ambulatory Visit: Payer: Self-pay | Admitting: Pharmacist

## 2021-10-18 ENCOUNTER — Ambulatory Visit: Payer: Self-pay | Admitting: Pharmacist

## 2021-11-01 ENCOUNTER — Ambulatory Visit: Payer: Self-pay | Admitting: Pharmacist

## 2021-11-03 ENCOUNTER — Encounter (HOSPITAL_COMMUNITY): Payer: Self-pay | Admitting: *Deleted

## 2021-11-03 ENCOUNTER — Emergency Department (HOSPITAL_COMMUNITY): Payer: Self-pay

## 2021-11-03 ENCOUNTER — Emergency Department (HOSPITAL_COMMUNITY)
Admission: EM | Admit: 2021-11-03 | Discharge: 2021-11-03 | Disposition: A | Discharge status: Home / Self Care | Payer: Self-pay | Attending: Emergency Medicine | Admitting: Emergency Medicine

## 2021-11-03 ENCOUNTER — Other Ambulatory Visit: Payer: Self-pay

## 2021-11-03 DIAGNOSIS — Z20822 Contact with and (suspected) exposure to covid-19: Secondary | ICD-10-CM | POA: Insufficient documentation

## 2021-11-03 DIAGNOSIS — F17200 Nicotine dependence, unspecified, uncomplicated: Secondary | ICD-10-CM | POA: Insufficient documentation

## 2021-11-03 DIAGNOSIS — J069 Acute upper respiratory infection, unspecified: Secondary | ICD-10-CM | POA: Insufficient documentation

## 2021-11-03 DIAGNOSIS — I10 Essential (primary) hypertension: Secondary | ICD-10-CM | POA: Insufficient documentation

## 2021-11-03 LAB — RESP PANEL BY RT-PCR (FLU A&B, COVID) ARPGX2
Influenza A by PCR: NEGATIVE
Influenza B by PCR: NEGATIVE
SARS Coronavirus 2 by RT PCR: NEGATIVE

## 2021-11-03 MED ORDER — ALBUTEROL SULFATE HFA 108 (90 BASE) MCG/ACT IN AERS: 1.0000 | INHALATION_SPRAY | Freq: Four times a day (QID) | RESPIRATORY_TRACT | 3 refills | Status: DC | PRN

## 2021-11-03 MED ORDER — AEROCHAMBER PLUS MISC: 2 refills | Status: DC

## 2021-11-03 MED ORDER — DOXYCYCLINE HYCLATE 100 MG PO CAPS: 100.0000 mg | ORAL_CAPSULE | Freq: Two times a day (BID) | ORAL | 0 refills | Status: DC

## 2021-11-03 MED ORDER — IPRATROPIUM-ALBUTEROL 0.5-2.5 (3) MG/3ML IN SOLN
3.0000 mL | Freq: Once | RESPIRATORY_TRACT | Status: AC
  Administered 2021-11-03: 3 mL via RESPIRATORY_TRACT
  Filled 2021-11-03: qty 3

## 2021-11-03 MED ORDER — PREDNISONE 10 MG PO TABS: 40.0000 mg | ORAL_TABLET | Freq: Every day | ORAL | 0 refills | Status: AC

## 2021-11-03 NOTE — ED Triage Notes (Signed)
Patient presents to ed c/o sob with cough and generalized bodyaches onset 4 days ago. States he was told by his boss to see a doctor before coming back to work.

## 2021-11-03 NOTE — ED Provider Notes (Signed)
[facility] EMERGENCY DEPARTMENT Provider Note   CSN: [id number] Arrival date & time: 11/03/21  0814     History  Chief Complaint  Patient presents with   Shortness of Breath    Steven Li is a 56 y.o. male.  56 year old male presents with complaint of cough with shortness of breath with fevers, chills, body aches onset 4 days ago.  Patient is a daily smoker, history of hypertension, denies history of COPD or chronic lung disease.  No known sick contacts.  Denies chest pain.  No other complaints or concerns      Home Medications Prior to Admission medications   Medication Sig Start Date End Date Taking? Authorizing Provider  albuterol (VENTOLIN HFA) 108 (90 Base) MCG/ACT inhaler Inhale 1-2 puffs into the lungs every 6 (six) hours as needed for wheezing or shortness of breath. 11/03/21  Yes Tacy Learn, PA-C  doxycycline (VIBRAMYCIN) 100 MG capsule Take 1 capsule (100 mg total) by mouth 2 (two) times daily. 11/03/21  Yes Tacy Learn, PA-C  predniSONE (DELTASONE) 10 MG tablet Take 4 tablets (40 mg total) by mouth daily for 5 days. 11/03/21 11/08/21 Yes Tacy Learn, PA-C  Spacer/Aero-Holding Chambers (AEROCHAMBER PLUS) inhaler Use as instructed 11/03/21  Yes Tacy Learn, PA-C  amLODipine (NORVASC) 10 MG tablet Take 1 tablet (10 mg total) by mouth daily. Must have office visit for refills 08/23/21   Charlott Rakes, MD  ondansetron (ZOFRAN) 4 MG tablet Take 1 tablet (4 mg total) by mouth as needed for nausea or vomiting. 08/18/21   Azucena Cecil, PA-C      Allergies    Patient has no known allergies.    Review of Systems   Review of Systems  Constitutional:  Positive for chills and fever.  HENT:  Positive for congestion. Negative for sore throat.   Respiratory:  Positive for cough, shortness of breath and wheezing.   Cardiovascular:  Negative for chest pain.  Gastrointestinal:  Negative for nausea and vomiting.  Musculoskeletal:  Positive  for arthralgias and myalgias.  Skin:  Negative for rash.  Allergic/Immunologic: Negative for immunocompromised state.  Psychiatric/Behavioral:  Negative for confusion.   All other systems reviewed and are negative.  Physical Exam Updated Vital Signs BP (!) 158/97    Pulse 96    Temp 98.5 F (36.9 C) (Oral)    Resp 20    Ht 5\' 9"  (1.753 m)    Wt 59 kg    SpO2 100%    BMI 19.20 kg/m  Physical Exam Vitals and nursing note reviewed.  Constitutional:      General: He is not in acute distress.    Appearance: He is well-developed. He is not diaphoretic.  HENT:     Head: Normocephalic and atraumatic.  Cardiovascular:     Rate and Rhythm: Normal rate and regular rhythm.     Heart sounds: No murmur heard. Pulmonary:     Effort: Pulmonary effort is normal.     Breath sounds: Wheezing and rhonchi present.  Abdominal:     Palpations: Abdomen is soft.     Tenderness: There is no abdominal tenderness.  Musculoskeletal:     Cervical back: Neck supple.     Right lower leg: No edema.     Left lower leg: No edema.  Skin:    General: Skin is warm and dry.  Neurological:     Mental Status: He is alert and oriented to person, place, and time.  Psychiatric:        Behavior: Behavior normal.    ED Results / Procedures / Treatments   Labs (all labs ordered are listed, but only abnormal results are displayed) Labs Reviewed  RESP PANEL BY RT-PCR (FLU A&B, COVID) ARPGX2    EKG EKG Interpretation  Date/Time:  Saturday November 03 2021 06:48:55 EST Ventricular Rate:  80 PR Interval:  156 QRS Duration: 90 QT Interval:  417 QTC Calculation: 482 R Axis:   86 Text Interpretation: Sinus rhythm Probable left atrial enlargement Borderline prolonged QT interval Confirmed by Lennice Sites ([id number]) on 11/03/2021 7:21:08 AM  Radiology DG Chest Port 1 View  Result Date: 11/03/2021 CLINICAL DATA:  Cough and shortness of breath EXAM: PORTABLE CHEST 1 VIEW COMPARISON:  08/18/2021 FINDINGS: Normal heart  size and mediastinal contours. Subtle right apical density is stable over multiple years and likely from scarring. No acute infiltrate or edema. No effusion or pneumothorax. No acute osseous findings. Artifact from EKG leads. IMPRESSION: No acute finding. Electronically Signed   By: Jorje Guild M.D.   On: 11/03/2021 07:37    Procedures Procedures    Medications Ordered in ED Medications  ipratropium-albuterol (DUONEB) 0.5-2.5 (3) MG/3ML nebulizer solution 3 mL (3 mLs Nebulization Given 11/03/21 1660)    ED Course/ Medical Decision Making/ A&P                           Medical Decision Making Amount and/or Complexity of Data Reviewed Radiology: ordered.  Risk Prescription drug management.   This patient presents to the ED for concern of cough, shortness of breath, URI symptoms, this involves an extensive number of treatment options, and is a complaint that carries with it a high risk of complications and morbidity.  The differential diagnosis includes but not limited to COPD exacerbation, viral illness including COVID/flu, heart failure   Co morbidities that complicate the patient evaluation  Tobacco use   Lab Tests:  I Ordered, and personally interpreted labs.  The pertinent results include: COVID/flu test which shows negative   Imaging Studies ordered:  I ordered imaging studies including chest x-ray  I independently visualized and interpreted imaging which showed no acute disease I agree with the radiologist interpretation   Cardiac Monitoring:  The patient was maintained on a cardiac monitor.  I personally viewed and interpreted the cardiac monitored which showed an underlying rhythm of: Sinus rhythm   Medicines ordered and prescription drug management:  I ordered medication including DuoNeb for wheezing, shortness of breath Reevaluation of the patient after these medicines showed that the patient improved I have reviewed the patients home medicines and have  made adjustments as needed   Problem List / ED Course:  56 year old male with URI symptoms x4 days with complaint of shortness of breath.  Patient is a daily smoker.  He is found to have coarse lung sounds with wheezing throughout.  Symptoms resolved with DuoNeb treatment.  Patient is negative for COVID/flu.  Chest x-ray without acute findings.  Patient is discharged with inhaler with spacer and course of prednisone.  Also recommend course of antibiotics, suspect underlying COPD.  Recommend recheck with PCP.   Reevaluation:  After the interventions noted above, I reevaluated the patient and found that they have :resolved   Dispostion:  After consideration of the diagnostic results and the patients response to treatment, I feel that the patent would benefit from follow-up with PCP.  Final Clinical Impression(s) / ED Diagnoses Final diagnoses:  Viral URI with cough    Rx / DC Orders ED Discharge Orders          Ordered    predniSONE (DELTASONE) 10 MG tablet  Daily        11/03/21 0903    albuterol (VENTOLIN HFA) 108 (90 Base) MCG/ACT inhaler  Every 6 hours PRN        11/03/21 0903    Spacer/Aero-Holding Chambers (AEROCHAMBER PLUS) inhaler        11/03/21 0903    doxycycline (VIBRAMYCIN) 100 MG capsule  2 times daily        11/03/21 0903              Tacy Learn, PA-C 11/03/21 0905    Lennice Sites, DO 11/03/21 [phone]

## 2021-11-03 NOTE — ED Notes (Signed)
Pts sats remained 95% while ambulating

## 2021-11-03 NOTE — Discharge Instructions (Signed)
Take prednisone as prescribed and complete the full course.  Use inhaler as needed as prescribed every 4-6 hours.  Take doxycycline as prescribed and complete the full course.  Recheck with your primary care provider.

## 2021-11-13 ENCOUNTER — Emergency Department (HOSPITAL_COMMUNITY): Payer: Self-pay

## 2021-11-13 ENCOUNTER — Emergency Department (HOSPITAL_COMMUNITY)
Admission: EM | Admit: 2021-11-13 | Discharge: 2021-11-13 | Disposition: A | Discharge status: Home / Self Care | Payer: Self-pay | Attending: Emergency Medicine | Admitting: Emergency Medicine

## 2021-11-13 ENCOUNTER — Other Ambulatory Visit: Payer: Self-pay

## 2021-11-13 DIAGNOSIS — R059 Cough, unspecified: Secondary | ICD-10-CM | POA: Insufficient documentation

## 2021-11-13 DIAGNOSIS — Z20822 Contact with and (suspected) exposure to covid-19: Secondary | ICD-10-CM | POA: Insufficient documentation

## 2021-11-13 DIAGNOSIS — Z5321 Procedure and treatment not carried out due to patient leaving prior to being seen by health care provider: Secondary | ICD-10-CM | POA: Insufficient documentation

## 2021-11-13 DIAGNOSIS — R1012 Left upper quadrant pain: Secondary | ICD-10-CM | POA: Insufficient documentation

## 2021-11-13 LAB — CBC WITH DIFFERENTIAL/PLATELET
Abs Immature Granulocytes: 0.04 10*3/uL (ref 0.00–0.07)
Basophils Absolute: 0.1 10*3/uL (ref 0.0–0.1)
Basophils Relative: 1 %
Eosinophils Absolute: 0.2 10*3/uL (ref 0.0–0.5)
Eosinophils Relative: 3 %
HCT: 36.2 % — ABNORMAL LOW (ref 39.0–52.0)
Hemoglobin: 12 g/dL — ABNORMAL LOW (ref 13.0–17.0)
Immature Granulocytes: 1 %
Lymphocytes Relative: 27 %
Lymphs Abs: 1.8 10*3/uL (ref 0.7–4.0)
MCH: 32.1 pg (ref 26.0–34.0)
MCHC: 33.1 g/dL (ref 30.0–36.0)
MCV: 96.8 fL (ref 80.0–100.0)
Monocytes Absolute: 1 10*3/uL (ref 0.1–1.0)
Monocytes Relative: 15 %
Neutro Abs: 3.5 10*3/uL (ref 1.7–7.7)
Neutrophils Relative %: 53 %
Platelets: 242 10*3/uL (ref 150–400)
RBC: 3.74 MIL/uL — ABNORMAL LOW (ref 4.22–5.81)
RDW: 12.6 % (ref 11.5–15.5)
WBC: 6.6 10*3/uL (ref 4.0–10.5)
nRBC: 0 % (ref 0.0–0.2)

## 2021-11-13 LAB — COMPREHENSIVE METABOLIC PANEL
ALT: 17 U/L (ref 0–44)
AST: 32 U/L (ref 15–41)
Albumin: 3.5 g/dL (ref 3.5–5.0)
Alkaline Phosphatase: 56 U/L (ref 38–126)
Anion gap: 11 (ref 5–15)
BUN: 17 mg/dL (ref 6–20)
CO2: 20 mmol/L — ABNORMAL LOW (ref 22–32)
Calcium: 8.7 mg/dL — ABNORMAL LOW (ref 8.9–10.3)
Chloride: 104 mmol/L (ref 98–111)
Creatinine, Ser: 1.08 mg/dL (ref 0.61–1.24)
GFR, Estimated: >60 mL/min (ref >60)
Glucose, Bld: 127 mg/dL — ABNORMAL HIGH (ref 70–99)
Potassium: 3.9 mmol/L (ref 3.5–5.1)
Sodium: 135 mmol/L (ref 135–145)
Total Bilirubin: 0.5 mg/dL (ref 0.3–1.2)
Total Protein: 6.5 g/dL (ref 6.5–8.1)

## 2021-11-13 LAB — URINALYSIS, ROUTINE W REFLEX MICROSCOPIC
Bacteria, UA: NONE SEEN
Bilirubin Urine: NEGATIVE
Glucose, UA: NEGATIVE mg/dL
Ketones, ur: 5 mg/dL — AB
Leukocytes,Ua: NEGATIVE
Nitrite: NEGATIVE
Protein, ur: NEGATIVE mg/dL
Specific Gravity, Urine: 1.01 (ref 1.005–1.030)
pH: 5 (ref 5.0–8.0)

## 2021-11-13 LAB — RESP PANEL BY RT-PCR (FLU A&B, COVID) ARPGX2
Influenza A by PCR: NEGATIVE
Influenza B by PCR: NEGATIVE
SARS Coronavirus 2 by RT PCR: NEGATIVE

## 2021-11-13 LAB — LIPASE, BLOOD: Lipase: 33 U/L (ref 11–51)

## 2021-11-13 LAB — ETHANOL: Alcohol, Ethyl (B): 30 mg/dL — ABNORMAL HIGH (ref <10)

## 2021-11-13 NOTE — ED Triage Notes (Signed)
Pt c/o LUQ pain that radiates to the ribs. Pt reports it worsens with cough and activity.

## 2021-11-13 NOTE — ED Notes (Signed)
Called patient for the 3rd time, No answer.

## 2021-11-13 NOTE — ED Provider Triage Note (Signed)
Emergency Medicine Provider Triage Evaluation Note  Steven Li , a 55 y.o. male  was evaluated in triage.  Pt complains of left upper abdominal pain/rib pain.  States seen in the ED last week and diagnosed with COPD.  Since then worsening pain with coughing, twisting, bending over, etc.  Pain in left upper abdomen and left ribs.  Initially had some vomiting.  Denies hemoptysis or hematemesis.  Does have hx of upper GI bleeds in the past.  Review of Systems  Positive: Abdominal pain, rib pain Negative: fever  Physical Exam  BP 127/80 (BP Location: Right Arm)    Pulse (!) 102    Temp 99.1 F (37.3 C) (Oral)    Resp 16    SpO2 94%   Gen:   Awake, no distress   Resp:  Normal effort  MSK:   Moves extremities without difficulty  Other:  Tender along left lower ribs and into LUQ, no deformity noted  Medical Decision Making  Medically screening exam initiated at 5:44 AM.  Appropriate orders placed.  [name] was informed that the remainder of the evaluation will be completed by another provider, this initial triage assessment does not replace that evaluation, and the importance of remaining in the ED until their evaluation is complete.  Left upper abdominal/rib pain.  Worse with coughing.  Low grade temp of 71F in triage.  No deformities noted on exam.  Does have hx of GI bleeds in the past, denies melena or hematemesis recently.  Will check labs, rib films, covid screen.   Larene Pickett, PA-C 11/13/21 [phone]

## 2021-11-13 NOTE — ED Notes (Signed)
Called Patient 2 Times no answer.

## 2021-11-14 ENCOUNTER — Encounter: Payer: Self-pay | Admitting: Pharmacist

## 2021-11-14 ENCOUNTER — Other Ambulatory Visit: Payer: Self-pay

## 2021-11-14 ENCOUNTER — Ambulatory Visit: Payer: Self-pay | Attending: Family Medicine | Admitting: Pharmacist

## 2021-11-14 DIAGNOSIS — I10 Essential (primary) hypertension: Secondary | ICD-10-CM

## 2021-11-14 MED ORDER — AMLODIPINE BESYLATE 10 MG PO TABS
10.0000 mg | ORAL_TABLET | Freq: Every day | ORAL | 1 refills | Status: DC
  Filled 2021-11-14: qty 30, 30d supply, fill #0

## 2021-11-14 MED ORDER — AMLODIPINE BESYLATE 10 MG PO TABS: 10.0000 mg | ORAL_TABLET | Freq: Every day | ORAL | 1 refills | Status: DC

## 2021-11-14 NOTE — Progress Notes (Signed)
° °  S:    PCP: Dr. Margarita Rana   Patient arrives in good spirits. Presents to the clinic for hypertension evaluation, counseling, and management. Patient was referred and last seen by Primary Care Provider on 08/23/2021. His BP was 182/94 at that visit. Endorsed being without medications for months prior to that appointment. No changes were made to his regimen. Refills were sent.    Medication adherence reported, however, he has not taken his BP medication today. Denies chest pain. Endorses dyspnea and gets short of breath more upon exertion. Denies excessive weakness or fatigue.   Current BP Medications include:  amlodipine 10 mg daily  Dietary habits include: eats a lot of spicy food, endorses lack of appetite. Works at [employer] and eats there a lot. Tries not to add salt to foods. Denies excessive intake of caffeine.  Exercise habits include: none outside of work  Family / Social history:  Fhx: cirrhosis, stomach cancer, DM  Tobacco: current 0.5 PPD smoker Alcohol: consumes 4 Natural Lights (24 oz) each day  O:  Vitals:   11/14/21 0852  BP: (!) 145/76  Pulse: 88   Home BP readings: none   Last 3 Office BP readings: BP Readings from Last 3 Encounters:  11/13/21 (!) 170/99  11/03/21 (!) 142/91  08/23/21 (!) 182/94    BMET    Component Value Date/Time   NA 135 11/13/2021 0550   NA 138 05/15/2020 0932   K 3.9 11/13/2021 0550   CL 104 11/13/2021 0550   CO2 20 (L) 11/13/2021 0550   GLUCOSE 127 (H) 11/13/2021 0550   BUN 17 11/13/2021 0550   BUN 13 05/15/2020 0932   CREATININE 1.08 11/13/2021 0550   CREATININE 0.58 (L) 10/17/2016 1133   CALCIUM 8.7 (L) 11/13/2021 0550   GFRNONAA >60 11/13/2021 0550   GFRNONAA >89 10/17/2016 1133   GFRAA 116 05/15/2020 0932   GFRAA >89 10/17/2016 1133    Renal function: Estimated Creatinine Clearance: 63 mL/min (by C-G formula based on SCr of 1.08 mg/dL).  Clinical ASCVD: No  The ASCVD Risk score (Arnett DK, et al., 2019) failed to  calculate for the following reasons:   The valid HDL cholesterol range is 20 to 100 mg/dL   A/P: Hypertension longstanding currently above goal but improved from previous clinic readings on current medications. BP Goal = < 130/80 mmHg. Medication adherence reported, however, he has not taken his amlodipine today. Will hold off on any changes. Refills for amlodipine sent and he will see Dr. Margarita Rana on 2/20.  -Continued amlodipine 10 mg daily. -Commended cutting back to 0.5 PPD. Recommended cessation.  -Strongly recommended cutting back on alcohol.   -Counseled on lifestyle modifications for blood pressure control including reduced dietary sodium, increased exercise, adequate sleep.  Results reviewed and written information provided.   Total time in face-to-face counseling 20 minutes.   F/U Clinic Visit in 2 weeks with PCP.  Benard Halsted, PharmD, [facility], [place] [phone]

## 2021-11-26 ENCOUNTER — Ambulatory Visit: Payer: Self-pay | Admitting: Family Medicine

## 2021-11-27 ENCOUNTER — Other Ambulatory Visit: Payer: Self-pay

## 2021-11-27 ENCOUNTER — Encounter: Payer: Self-pay | Admitting: Family Medicine

## 2021-11-27 ENCOUNTER — Ambulatory Visit: Payer: Self-pay | Attending: Family Medicine | Admitting: Family Medicine

## 2021-11-27 VITALS — BP 120/60 | HR 95 | Ht 69.0 in | Wt 118.2 lb

## 2021-11-27 DIAGNOSIS — F1010 Alcohol abuse, uncomplicated: Secondary | ICD-10-CM

## 2021-11-27 DIAGNOSIS — F17210 Nicotine dependence, cigarettes, uncomplicated: Secondary | ICD-10-CM

## 2021-11-27 DIAGNOSIS — I10 Essential (primary) hypertension: Secondary | ICD-10-CM

## 2021-11-27 DIAGNOSIS — Z23 Encounter for immunization: Secondary | ICD-10-CM

## 2021-11-27 DIAGNOSIS — Z1211 Encounter for screening for malignant neoplasm of colon: Secondary | ICD-10-CM

## 2021-11-27 DIAGNOSIS — G621 Alcoholic polyneuropathy: Secondary | ICD-10-CM

## 2021-11-27 MED ORDER — GABAPENTIN 300 MG PO CAPS
300.0000 mg | ORAL_CAPSULE | Freq: Every day | ORAL | 6 refills | Status: DC
  Filled 2021-11-27: qty 30, 30d supply, fill #0
  Filled 2021-12-27: qty 30, 30d supply, fill #1
  Filled 2022-02-11: qty 30, 30d supply, fill #2
  Filled 2022-04-05: qty 30, 30d supply, fill #3

## 2021-11-27 MED ORDER — THIAMINE HCL 100 MG PO TABS
100.0000 mg | ORAL_TABLET | Freq: Every day | ORAL | 6 refills | Status: DC
  Filled 2021-11-27: qty 30, 30d supply, fill #0

## 2021-11-27 MED ORDER — AMLODIPINE BESYLATE 10 MG PO TABS
10.0000 mg | ORAL_TABLET | Freq: Every day | ORAL | 1 refills | Status: DC
  Filled 2021-11-27: qty 90, 90d supply, fill #0
  Filled 2021-12-11: qty 30, 30d supply, fill #0
  Filled 2022-01-11: qty 30, 30d supply, fill #1
  Filled 2022-02-11: qty 30, 30d supply, fill #2
  Filled 2022-03-12: qty 30, 30d supply, fill #3
  Filled 2022-04-05: qty 30, 30d supply, fill #4
  Filled 2022-05-20: qty 30, 30d supply, fill #5

## 2021-11-27 NOTE — Progress Notes (Signed)
° °Subjective:  °Patient ID: Steven Li, male    DOB: 10/05/1966  Age: 55 y.o. MRN: [id number] ° °CC: Hypertension ° ° °HPI °Steven Li is a 55 y.o. year old male with a history of hypertension, tobacco abuse (greater than 20-pack-year) here for chronic disease management. ° °Interval History: °He has had L arm pain x1 mo described as 4/10, shooting from his left elbow down to his hand which is intermittent and sometimes has numbness.  He is right-handed. Denies presence of neck pain °Endorses drinking alcohol but has cut down from a 12 pack to a  6 pack. °Also endorses he had been smoking a pack of cigarettes for greater than 30 years but recently cut down to half a pack daily. ° °Initial blood pressure was 147/93 but repeat performed manually by myself returned at 120/60. °He has no additional concerns today. °Past Medical History:  °Diagnosis Date  ° ETOH abuse   ° Headache   ° "a few/year" (04/17/2018)  ° High cholesterol   ° Hypertension   ° Migraine   ° "a few/year" (04/17/2018)  ° ° °Past Surgical History:  °Procedure Laterality Date  ° NO PAST SURGERIES    ° ° °Family History  °Problem Relation Age of Onset  ° Cirrhosis Mother   °     alcoholic  ° Stomach cancer Mother   ° Cirrhosis Father   °     alcoholic   ° Diabetes Sister   ° ° °No Known Allergies ° °Outpatient Medications Prior to Visit  °Medication Sig Dispense Refill  ° albuterol (VENTOLIN HFA) 108 (90 Base) MCG/ACT inhaler Inhale 1-2 puffs into the lungs every 6 (six) hours as needed for wheezing or shortness of breath. 18 g 3  ° amLODipine (NORVASC) 10 MG tablet Take 1 tablet (10 mg total) by mouth daily. Must have office visit for refills 90 tablet 1  ° doxycycline (VIBRAMYCIN) 100 MG capsule Take 1 capsule (100 mg total) by mouth 2 (two) times daily. (Patient not taking: Reported on 11/27/2021) 20 capsule 0  ° ondansetron (ZOFRAN) 4 MG tablet Take 1 tablet (4 mg total) by mouth as needed for nausea or vomiting. (Patient not taking: Reported on  11/27/2021) 12 tablet 0  ° predniSONE (DELTASONE) 10 MG tablet Take 40 mg by mouth daily. For 5 days (Patient not taking: Reported on 11/27/2021)    ° Spacer/Aero-Holding Chambers (AEROCHAMBER PLUS) inhaler Use as instructed (Patient not taking: Reported on 11/27/2021) 1 each 2  ° °No facility-administered medications prior to visit.  ° ° ° °ROS °Review of Systems  °Constitutional:  Negative for activity change and appetite change.  °HENT:  Negative for sinus pressure and sore throat.   °Eyes:  Negative for visual disturbance.  °Respiratory:  Negative for cough, chest tightness and shortness of breath.   °Cardiovascular:  Negative for chest pain and leg swelling.  °Gastrointestinal:  Negative for abdominal distention, abdominal pain, constipation and diarrhea.  °Endocrine: Negative.   °Genitourinary:  Negative for dysuria.  °Musculoskeletal:   °     See HPI  °Skin:  Negative for rash.  °Allergic/Immunologic: Negative.   °Neurological:  Positive for numbness. Negative for weakness and light-headedness.  °Psychiatric/Behavioral:  Negative for dysphoric mood and suicidal ideas.   ° °Objective:  °BP 120/60    Pulse 95    Ht 5' 9" (1.753 m)    Wt 118 lb 3.2 oz (53.6 kg)    SpO2 98%    BMI 17.46 kg/m²  ° °  kg/m   BP/Weight 11/27/2021 06/09/9029 0/06/2329  Systolic BP 076 226 333  Diastolic BP 60 76 99  Wt. (Lbs) 118.2 - -  BMI 17.46 - -      Physical Exam Constitutional:      Appearance: He is well-developed.  Cardiovascular:     Rate and Rhythm: Normal rate.     Heart sounds: Normal heart sounds. No murmur heard. Pulmonary:     Effort: Pulmonary effort is normal.     Breath sounds: Normal breath sounds. No wheezing or rales.  Chest:     Chest wall: No tenderness.  Abdominal:     General: Bowel sounds are normal. There is no distension.     Palpations: Abdomen is soft. There is no mass.     Tenderness: There is no abdominal tenderness.  Musculoskeletal:        General: Normal range of motion.     Right lower  leg: No edema.     Left lower leg: No edema.     Comments: Normal handgrip bilaterally  Neurological:     Mental Status: He is alert and oriented to person, place, and time.     Sensory: No sensory deficit.     Coordination: Coordination normal.  Psychiatric:        Mood and Affect: Mood normal.    CMP Latest Ref Rng & Units 11/13/2021 08/18/2021 05/15/2020  Glucose 70 - 99 mg/dL 127(H) 76 90  BUN 6 - 20 mg/dL _0 Creatinine 0.61 - 1.24 mg/dL 1.08 1.06 0.79  Sodium 135 - 145 mmol/L 135 133(L) 138  Potassium 3.5 - 5.1 mmol/L 3.9 4.6 4.6  Chloride 98 - 111 mmol/L 104 98 99  CO2 22 - 32 mmol/L 20(L) 18(L) 23  Calcium 8.9 - 10.3 mg/dL 8.7(L) 9.1 9.3  Total Protein 6.5 - 8.1 g/dL 6.5 6.8 7.5  Total Bilirubin 0.3 - 1.2 mg/dL 0.5 0.8 0.3  Alkaline Phos 38 - 126 U/L 56 83 115  AST 15 - 41 U/L 32 41 30  ALT 0 - 44 U/L _1 Lipid Panel     Component Value Date/Time   CHOL 198 05/15/2020 0932   TRIG 92 05/15/2020 0932   HDL 112 05/15/2020 0932   CHOLHDL 1.8 05/15/2020 0932   LDLCALC 70 05/15/2020 0932    CBC    Component Value Date/Time   WBC 6.6 11/13/2021 0550   RBC 3.74 (L) 11/13/2021 0550   HGB 12.0 (L) 11/13/2021 0550   HGB 12.8 (L) 02/01/2019 0921   HCT 36.2 (L) 11/13/2021 0550   HCT 36.6 (L) 02/01/2019 0921   PLT 242 11/13/2021 0550   PLT 321 02/01/2019 0921   MCV 96.8 11/13/2021 0550   MCV 88 02/01/2019 0921   MCH 32.1 11/13/2021 0550   MCHC 33.1 11/13/2021 0550   RDW 12.6 11/13/2021 0550   RDW 11.8 02/01/2019 0921   LYMPHSABS 1.8 11/13/2021 0550   MONOABS 1.0 11/13/2021 0550   EOSABS 0.2 11/13/2021 0550   BASOSABS 0.1 11/13/2021 0550    No results found for: HGBA1C Assessment & Plan:  1. Essential hypertension Controlled Counseled on blood pressure goal of less than 130/80, low-sodium, DASH diet, medication compliance, 150 minutes of moderate intensity exercise per week. Discussed medication compliance, adverse effects. - amLODipine (NORVASC)  10 MG tablet; Take 1 tablet (10 mg total) by mouth daily.  Dispense: 90 tablet; Refill: 1 - CMP14+EGFR - LP+Non-HDL Cholesterol  2. Screening for colon  cancer - Fecal occult blood, imunochemical([facility])  3. Smoking greater than 20 pack years Counseled on smoking cessation and he is not ready to quit at this time - CT CHEST LUNG CANCER SCREENING LOW DOSE WO CONTRAST; Future  4. Alcohol abuse Amended on cutting back on alcohol abuse Encouraged to continue to work on quitting  5. Alcoholic peripheral neuropathy ([place]) Uncontrolled We will also evaluate for other possible etiology Counseled on sedating side effects of gabapentin and after shared decision making he would like to proceed with gabapentin. - Vitamin B12 - Hemoglobin A1c - thiamine 100 MG tablet; Take 1 tablet (100 mg total) by mouth daily.  Dispense: 30 tablet; Refill: 6 - gabapentin (NEURONTIN) 300 MG capsule; Take 1 capsule (300 mg total) by mouth at bedtime.  Dispense: 30 capsule; Refill: 6   Meds ordered this encounter  Medications   thiamine 100 MG tablet    Sig: Take 1 tablet (100 mg total) by mouth daily.    Dispense:  30 tablet    Refill:  6   gabapentin (NEURONTIN) 300 MG capsule    Sig: Take 1 capsule (300 mg total) by mouth at bedtime.    Dispense:  30 capsule    Refill:  6   amLODipine (NORVASC) 10 MG tablet    Sig: Take 1 tablet (10 mg total) by mouth daily.    Dispense:  90 tablet    Refill:  1    Follow-up: Return in about 6 months (around 05/27/2022) for Chronic medical conditions.       Charlott Rakes, MD, FAAFP. [facility] [place], [place]   11/27/2021, 9:00 AM

## 2021-11-27 NOTE — Patient Instructions (Signed)
Neuropathic Pain °Neuropathic pain is pain caused by damage to the nerves that are responsible for certain sensations in your body (sensory nerves). °Neuropathic pain can make you more sensitive to pain. Even a minor sensation can feel very painful. This is usually a long-term (chronic) condition that can be difficult to treat. The type of pain differs from person to person. It may: °Start suddenly (acute), or it may develop slowly and become chronic. °Come and go as damaged nerves heal, or it may stay at the same level for years. °Cause emotional distress, loss of sleep, and a lower quality of life. °What are the causes? °The most common cause of this condition is diabetes. Many other diseases and conditions can also cause neuropathic pain. Causes of neuropathic pain can be classified as: °Toxic. This is caused by medicines and chemicals. The most common causes of toxic neuropathic pain is damage from medicines that kill cancer cells (chemotherapy) or alcohol abuse. °Metabolic. This can be caused by: °Diabetes. °Lack of vitamins like B12. °Traumatic. Any injury that cuts, crushes, or stretches a nerve can cause damage and pain. °Compression-related. If a sensory nerve gets trapped or compressed for a long period of time, the blood supply to the nerve can be cut off. °Vascular. Many blood vessel diseases can cause neuropathic pain by decreasing blood supply and oxygen to nerves. °Autoimmune. This type of pain results from diseases in which the body's defense system (immune system) mistakenly attacks sensory nerves. Examples of autoimmune diseases that can cause neuropathic pain include lupus and multiple sclerosis. °Infectious. Many types of viral infections can damage sensory nerves and cause pain. Shingles infection is a common cause of this type of pain. °Inherited. Neuropathic pain can be a symptom of many diseases that are passed down through families (genetic). °What increases the risk? °You are more likely to  develop this condition if: °You have diabetes. °You smoke. °You drink too much alcohol. °You are taking certain medicines, including chemotherapy or medicines that treat immune system disorders. °What are the signs or symptoms? °The main symptom is pain. Neuropathic pain is often described as: °Burning. °Shock-like. °Stinging. °Hot or cold. °Itching. °How is this diagnosed? °No single test can diagnose neuropathic pain. It is diagnosed based on: °A physical exam and your symptoms. Your health care provider will ask you about your pain. You may be asked to use a pain scale to describe how bad your pain is. °Tests. These may be done to see if you have a cause and location of any nerve damage. They include: °Nerve conduction studies and electromyography to test how well nerve signals travel through your nerves and muscles (electrodiagnostic testing). °Skin biopsy to evaluate for small fiber neuropathy. °Imaging studies, such as: °X-rays. °CT scan. °MRI. °How is this treated? °Treatment for neuropathic pain may change over time. You may need to try different treatment options or a combination of treatments. Some options include: °Treating the underlying cause of the neuropathy, such as diabetes, kidney disease, or vitamin deficiencies. °Stopping medicines that can cause neuropathy, such as chemotherapy. °Medicine to relieve pain. Medicines may include: °Prescription or over-the-counter pain medicine. °Anti-seizure medicine. °Antidepressant medicines. °Pain-relieving patches or creams that are applied to painful areas of skin. °A medicine to numb the area (local anesthetic), which can be injected as a nerve block. °Transcutaneous nerve stimulation. This uses electrical currents to block painful nerve signals. The treatment is painless. °Alternative treatments, such as: °Acupuncture. °Meditation. °Massage. °Occupational or physical therapy. °Pain management programs. °Counseling. °Follow   these instructions at  home: °Medicines ° °Take over-the-counter and prescription medicines only as told by your health care provider. °Ask your health care provider if the medicine prescribed to you: °Requires you to avoid driving or using machinery. °Can cause constipation. You may need to take these actions to prevent or treat constipation: °Drink enough fluid to keep your urine pale yellow. °Take over-the-counter or prescription medicines. °Eat foods that are high in fiber, such as beans, whole grains, and fresh fruits and vegetables. °Limit foods that are high in fat and processed sugars, such as fried or sweet foods. °Lifestyle ° °Have a good support system at home. °Consider joining a chronic pain support group. °Do not use any products that contain nicotine or tobacco. These products include cigarettes, chewing tobacco, and vaping devices, such as e-cigarettes. If you need help quitting, ask your health care provider. °Do not drink alcohol. °General instructions °Learn as much as you can about your condition. °Work closely with all your health care providers to find the treatment plan that works best for you. °Ask your health care provider what activities are safe for you. °Keep all follow-up visits. This is important. °Contact a health care provider if: °Your pain treatments are not working. °You are having side effects from your medicines. °You are struggling with tiredness (fatigue), mood changes, depression, or anxiety. °Get help right away if: °You have thoughts of hurting yourself. °Get help right away if you feel like you may hurt yourself or others, or have thoughts about taking your own life. Go to your nearest emergency room or: °Call 911. °Call the National Suicide Prevention Lifeline at 1-800-273-8255 or 988. This is open 24 hours a day. °Text the Crisis Text Line at 741741. °Summary °Neuropathic pain is pain caused by damage to the nerves that are responsible for certain sensations in your body (sensory  nerves). °Neuropathic pain may come and go as damaged nerves heal, or it may stay at the same level for years. °Neuropathic pain is usually a long-term condition that can be difficult to treat. Consider joining a chronic pain support group. °This information is not intended to replace advice given to you by your health care provider. Make sure you discuss any questions you have with your health care provider. °Document Revised: 05/21/2021 Document Reviewed: 05/21/2021 °Elsevier Patient Education © 2022 [employer] ° °

## 2021-11-28 ENCOUNTER — Other Ambulatory Visit: Payer: Self-pay | Admitting: Family Medicine

## 2021-11-28 ENCOUNTER — Other Ambulatory Visit: Payer: Self-pay

## 2021-11-28 LAB — VITAMIN B12: Vitamin B-12: 941 pg/mL (ref 232–1245)

## 2021-11-28 LAB — CMP14+EGFR
ALT: 14 IU/L (ref 0–44)
AST: 39 IU/L (ref 0–40)
Albumin/Globulin Ratio: 1.9 (ref 1.2–2.2)
Albumin: 4.9 g/dL (ref 3.8–4.9)
Alkaline Phosphatase: 89 IU/L (ref 44–121)
BUN/Creatinine Ratio: 15 (ref 9–20)
BUN: 13 mg/dL (ref 6–24)
Bilirubin Total: 0.3 mg/dL (ref 0.0–1.2)
CO2: 21 mmol/L (ref 20–29)
Calcium: 9.7 mg/dL (ref 8.7–10.2)
Chloride: 99 mmol/L (ref 96–106)
Creatinine, Ser: 0.86 mg/dL (ref 0.76–1.27)
Globulin, Total: 2.6 g/dL (ref 1.5–4.5)
Glucose: 89 mg/dL (ref 70–99)
Potassium: 4.6 mmol/L (ref 3.5–5.2)
Sodium: 137 mmol/L (ref 134–144)
Total Protein: 7.5 g/dL (ref 6.0–8.5)
eGFR: 101 mL/min/{1.73_m2} (ref >59)

## 2021-11-28 LAB — LP+NON-HDL CHOLESTEROL
Cholesterol, Total: 239 mg/dL — ABNORMAL HIGH (ref 100–199)
HDL: 136 mg/dL (ref >39)
LDL Chol Calc (NIH): 86 mg/dL (ref 0–99)
Total Non-HDL-Chol (LDL+VLDL): 103 mg/dL (ref 0–129)
Triglycerides: 104 mg/dL (ref 0–149)
VLDL Cholesterol Cal: 17 mg/dL (ref 5–40)

## 2021-11-28 LAB — HEMOGLOBIN A1C
Est. average glucose Bld gHb Est-mCnc: 103 mg/dL
Hgb A1c MFr Bld: 5.2 % (ref 4.8–5.6)

## 2021-11-28 MED ORDER — ATORVASTATIN CALCIUM 20 MG PO TABS
20.0000 mg | ORAL_TABLET | Freq: Every day | ORAL | 6 refills | Status: DC
  Filled 2021-11-28: qty 30, 30d supply, fill #0
  Filled 2021-12-27: qty 30, 30d supply, fill #1
  Filled 2022-02-11: qty 30, 30d supply, fill #2
  Filled 2022-04-05: qty 30, 30d supply, fill #3
  Filled 2022-05-20: qty 30, 30d supply, fill #4

## 2021-11-28 MED ORDER — ATORVASTATIN CALCIUM 20 MG PO TABS: 20.0000 mg | ORAL_TABLET | Freq: Every day | ORAL | 6 refills | Status: DC

## 2021-11-29 LAB — FECAL OCCULT BLOOD, IMMUNOCHEMICAL: Fecal Occult Bld: NEGATIVE

## 2021-11-30 ENCOUNTER — Other Ambulatory Visit: Payer: Self-pay

## 2021-11-30 ENCOUNTER — Ambulatory Visit (HOSPITAL_BASED_OUTPATIENT_CLINIC_OR_DEPARTMENT_OTHER)
Admission: RE | Admit: 2021-11-30 | Discharge: 2021-11-30 | Disposition: A | Discharge status: Home / Self Care | Payer: Self-pay | Source: Ambulatory Visit | Attending: Family Medicine | Admitting: Family Medicine

## 2021-11-30 ENCOUNTER — Telehealth: Payer: Self-pay

## 2021-11-30 DIAGNOSIS — F17210 Nicotine dependence, cigarettes, uncomplicated: Secondary | ICD-10-CM | POA: Insufficient documentation

## 2021-11-30 NOTE — Telephone Encounter (Signed)
Contacted pt to go over lab results pt didn't answer lvm   Sent a CRM and forward labs to NT to give pt labs when they call back   

## 2021-12-06 ENCOUNTER — Telehealth: Payer: Self-pay

## 2021-12-06 NOTE — Telephone Encounter (Signed)
Pt given CT results per notes of Dr. Margarita Rana on 12/06/21. Pt verbalized understanding.  ?

## 2021-12-11 ENCOUNTER — Other Ambulatory Visit: Payer: Self-pay

## 2021-12-12 ENCOUNTER — Ambulatory Visit: Payer: Self-pay

## 2021-12-12 ENCOUNTER — Other Ambulatory Visit: Payer: Self-pay

## 2021-12-12 NOTE — Telephone Encounter (Signed)
?  Chief Complaint: leg pain ?Symptoms: R upper thigh pain 10/10 when standing or walking ?Frequency: 3 days ?Pertinent Negatives: Patient denies numbness or weakness ?Disposition: '[]'$ ED /'[]'$ Urgent Care (no appt availability in office) / '[x]'$ Appointment(In office/virtual)/ '[]'$  [facility]/ '[]'$ Home Care/ '[]'$ Refused Recommended Disposition /'[]'$ [facility]/ '[]'$  Follow-up with PCP ?Additional Notes: pt has tried some Bengay cream but hasn't helped yet. Unsure if pain he has with hands and arms is affecting leg pain or not.  ? ? ?Reason for Disposition ? [1] SEVERE pain (e.g., excruciating, unable to do any normal activities) AND [2] not improved after 2 hours of pain medicine ? ?Answer Assessment - Initial Assessment Questions ?1. ONSET: "When did the pain start?"  ?    3 days  ?2. LOCATION: "Where is the pain located?"  ?    R Upper thigh  ?3. PAIN: "How bad is the pain?"    (Scale 1-10; or mild, moderate, severe) ?  -  MILD (1-3): doesn't interfere with normal activities  ?  -  MODERATE (4-7): interferes with normal activities (e.g., work or school) or awakens from sleep, limping  ?  -  SEVERE (8-10): excruciating pain, unable to do any normal activities, unable to walk ?    10 ?4. WORK OR EXERCISE: "Has there been any recent work or exercise that involved this part of the body?"  ?    No ?6. OTHER SYMPTOMS: "Do you have any other symptoms?" (e.g., chest pain, back pain, breathing difficulty, swelling, rash, fever, numbness, weakness) ?    Unable to walk on RLE ? ?Protocols used: Leg Pain-A-AH ? ?

## 2021-12-13 ENCOUNTER — Other Ambulatory Visit: Payer: Self-pay

## 2021-12-13 ENCOUNTER — Ambulatory Visit: Payer: Self-pay | Admitting: Family Medicine

## 2021-12-14 ENCOUNTER — Other Ambulatory Visit: Payer: Self-pay

## 2021-12-17 ENCOUNTER — Other Ambulatory Visit: Payer: Self-pay

## 2021-12-17 ENCOUNTER — Other Ambulatory Visit: Payer: Self-pay | Admitting: Family Medicine

## 2021-12-17 NOTE — Telephone Encounter (Signed)
Requested medication (s) are due for refill today: yes ? ?Requested medication (s) are on the active medication list: yes   ? ?Last refill: 11/03/21  18g  3 refills ? ?Future visit scheduled yes 05/27/22 ? ?Notes to clinic:Historical provider, please review. Thank you. ? ?Requested Prescriptions  ?Pending Prescriptions Disp Refills  ? albuterol (VENTOLIN HFA) 108 (90 Base) MCG/ACT inhaler 18 g 3  ?  Sig: Inhale 1-2 puffs into the lungs every 6 (six) hours as needed for wheezing or shortness of breath.  ?  ? Pulmonology:  Beta Agonists 2 Passed - 12/17/2021  8:46 AM  ?  ?  Passed - Last BP in normal range  ?  BP Readings from Last 1 Encounters:  ?11/27/21 120/60  ?  ?  ?  ?  Passed - Last Heart Rate in normal range  ?  Pulse Readings from Last 1 Encounters:  ?11/27/21 95  ?  ?  ?  ?  Passed - Valid encounter within last 12 months  ?  Recent Outpatient Visits   ? ?      ? 2 weeks ago Screening for colon cancer  ? [place], MD  ? 1 month ago Essential hypertension  ? [place], RPH-CPP  ? 3 months ago Screening for colon cancer  ? [place] Charlott Rakes, MD  ? 1 year ago Alcohol abuse  ? [place], MD  ? 2 years ago Essential hypertension  ? [place] Charlott Rakes, MD  ? ?  ?  ?Future Appointments   ? ?        ? In 5 months Charlott Rakes, MD [place]  ? ?  ? ?  ?  ?  ? ? ? ? ?

## 2021-12-18 ENCOUNTER — Other Ambulatory Visit: Payer: Self-pay | Admitting: Family Medicine

## 2021-12-18 ENCOUNTER — Other Ambulatory Visit: Payer: Self-pay

## 2021-12-18 MED ORDER — ALBUTEROL SULFATE HFA 108 (90 BASE) MCG/ACT IN AERS
1.0000 | INHALATION_SPRAY | Freq: Four times a day (QID) | RESPIRATORY_TRACT | 3 refills | Status: DC | PRN
  Filled 2021-12-18: qty 18, 25d supply, fill #0
  Filled 2022-01-18: qty 18, 25d supply, fill #1
  Filled 2022-04-12: qty 18, 25d supply, fill #2
  Filled 2022-09-23: qty 18, 25d supply, fill #3

## 2021-12-28 ENCOUNTER — Other Ambulatory Visit: Payer: Self-pay

## 2022-01-06 ENCOUNTER — Emergency Department (HOSPITAL_COMMUNITY): Payer: Self-pay

## 2022-01-06 ENCOUNTER — Other Ambulatory Visit: Payer: Self-pay

## 2022-01-06 ENCOUNTER — Emergency Department (HOSPITAL_COMMUNITY)
Admission: EM | Admit: 2022-01-06 | Discharge: 2022-01-06 | Disposition: A | Discharge status: Home / Self Care | Payer: Self-pay | Attending: Emergency Medicine | Admitting: Emergency Medicine

## 2022-01-06 DIAGNOSIS — Z79899 Other long term (current) drug therapy: Secondary | ICD-10-CM | POA: Insufficient documentation

## 2022-01-06 DIAGNOSIS — B349 Viral infection, unspecified: Secondary | ICD-10-CM | POA: Insufficient documentation

## 2022-01-06 DIAGNOSIS — I10 Essential (primary) hypertension: Secondary | ICD-10-CM | POA: Insufficient documentation

## 2022-01-06 DIAGNOSIS — Z20822 Contact with and (suspected) exposure to covid-19: Secondary | ICD-10-CM | POA: Insufficient documentation

## 2022-01-06 LAB — RESP PANEL BY RT-PCR (FLU A&B, COVID) ARPGX2
Influenza A by PCR: NEGATIVE
Influenza B by PCR: NEGATIVE
SARS Coronavirus 2 by RT PCR: NEGATIVE

## 2022-01-06 LAB — GROUP A STREP BY PCR: Group A Strep by PCR: NOT DETECTED

## 2022-01-06 MED ORDER — BENZONATATE 100 MG PO CAPS: 100.0000 mg | ORAL_CAPSULE | Freq: Three times a day (TID) | ORAL | 0 refills | Status: DC

## 2022-01-06 MED ORDER — ACETAMINOPHEN 325 MG PO TABS
650.0000 mg | ORAL_TABLET | Freq: Once | ORAL | Status: AC
Start: 2022-01-06 — End: 2022-01-06
  Administered 2022-01-06: 650 mg via ORAL
  Filled 2022-01-06: qty 2

## 2022-01-06 MED ORDER — ONDANSETRON HCL 4 MG PO TABS
4.0000 mg | ORAL_TABLET | Freq: Four times a day (QID) | ORAL | 0 refills | Status: DC
  Filled 2022-01-06: qty 12, 3d supply, fill #0

## 2022-01-06 MED ORDER — BENZONATATE 100 MG PO CAPS
100.0000 mg | ORAL_CAPSULE | Freq: Three times a day (TID) | ORAL | 0 refills | Status: DC
  Filled 2022-01-06: qty 21, 7d supply, fill #0

## 2022-01-06 NOTE — Discharge Instructions (Addendum)
Please follow-up with your primary care provider for further evaluation.  Return to the emergency department for any worsening symptoms you might have. ?

## 2022-01-06 NOTE — ED Triage Notes (Signed)
Pt reports sore throat, chills, generalized body aches and SOB X2 days ?

## 2022-01-06 NOTE — ED Provider Notes (Signed)
?[facility] ?Provider Note ? ? ?CSN: [id number] ?Arrival date & time: 01/06/22  0159 ? ?  ? ?History ?Chief Complaint  ?Patient presents with  ? Generalized Body Aches  ? Sore Throat  ? ? ?Steven Li is a 56 y.o. male with history of alcohol abuse, hypertension, high cholesterol who presents to the emergency department with a 3-day history of generalized myalgias, sore throat, abdominal pain, nausea, vomiting, and diarrhea.  Abdominal pain is intermittent and last approximately 20 seconds per the patient.  He denies any sick contacts.  He denies any fever or chills.  No urinary complaints.  Patient also endorsing some shortness of breath. ? ? ?Sore Throat ? ? ?  ? ?Home Medications ?Prior to Admission medications   ?Medication Sig Start Date End Date Taking? Authorizing Provider  ?ondansetron (ZOFRAN) 4 MG tablet Take 1 tablet (4 mg total) by mouth every 6 (six) hours. 01/06/22  Yes Raul Del, Sherley Mckenney M, PA-C  ?albuterol (VENTOLIN HFA) 108 (90 Base) MCG/ACT inhaler Inhale 1-2 puffs into the lungs every 6 (six) hours as needed for wheezing or shortness of breath. 12/18/21   Charlott Rakes, MD  ?amLODipine (NORVASC) 10 MG tablet Take 1 tablet (10 mg total) by mouth daily. 11/27/21   Charlott Rakes, MD  ?atorvastatin (LIPITOR) 20 MG tablet Take 1 tablet (20 mg total) by mouth daily. 11/28/21   Charlott Rakes, MD  ?benzonatate (TESSALON) 100 MG capsule Take 1 capsule (100 mg total) by mouth every 8 (eight) hours. 01/06/22   Hendricks Limes, PA-C  ?gabapentin (NEURONTIN) 300 MG capsule Take 1 capsule (300 mg total) by mouth at bedtime. 11/27/21   Charlott Rakes, MD  ?thiamine 100 MG tablet Take 1 tablet (100 mg total) by mouth daily. 11/27/21   Charlott Rakes, MD  ?   ? ?Allergies    ?Patient has no known allergies.   ? ?Review of Systems   ?Review of Systems  ?All other systems reviewed and are negative. ? ?Physical Exam ?Updated Vital Signs ?BP (!) 130/94   Pulse (!) 102   Temp 98.4  ?F (36.9 ?C) (Oral)   Resp 16   SpO2 100%  ?Physical Exam ?Vitals and nursing note reviewed.  ?Constitutional:   ?   General: He is not in acute distress. ?   Appearance: Normal appearance.  ?HENT:  ?   Head: Normocephalic and atraumatic.  ?   Mouth/Throat:  ?   Pharynx: Oropharynx is clear. Uvula midline. Posterior oropharyngeal erythema present. No pharyngeal swelling, oropharyngeal exudate or uvula swelling.  ?   Tonsils: No tonsillar exudate or tonsillar abscesses.  ?Eyes:  ?   General:     ?   Right eye: No discharge.     ?   Left eye: No discharge.  ?Cardiovascular:  ?   Comments: Regular rate and rhythm.  S1/S2 are distinct without any evidence of murmur, rubs, or gallops.  Radial pulses are 2+ bilaterally.  Dorsalis pedis pulses are 2+ bilaterally.  No evidence of pedal edema. ?Pulmonary:  ?   Comments: Clear to auscultation bilaterally.  Normal effort.  No respiratory distress.  No evidence of wheezes, rales, or rhonchi heard throughout. ?Abdominal:  ?   General: Abdomen is flat. Bowel sounds are normal. There is no distension.  ?   Tenderness: There is no guarding or rebound.  ?   Comments: Minimal generalized abdominal tenderness.   ?Musculoskeletal:     ?   General: Normal range of motion.  ?  Cervical back: Neck supple.  ?Skin: ?   General: Skin is warm and dry.  ?   Findings: No rash.  ?Neurological:  ?   General: No focal deficit present.  ?   Mental Status: He is alert.  ?Psychiatric:     ?   Mood and Affect: Mood normal.     ?   Behavior: Behavior normal.  ? ? ?ED Results / Procedures / Treatments   ?Labs ?(all labs ordered are listed, but only abnormal results are displayed) ?Labs Reviewed  ?RESP PANEL BY RT-PCR (FLU A&B, COVID) ARPGX2  ?GROUP A STREP BY PCR  ? ? ?EKG ?EKG Interpretation ? ?Date/Time:  Sunday January 06 2022 02:11:02 EDT ?Ventricular Rate:  122 ?PR Interval:  142 ?QRS Duration: 82 ?QT Interval:  320 ?QTC Calculation: 456 ?R Axis:   89 ?Text Interpretation: Sinus tachycardia  Biatrial enlargement Abnormal ECG When compared with ECG of 03-Nov-2021 06:48,  increased rate from prior Confirmed by Aletta Edouard [phone]) on 01/06/2022 6:49:07 AM ? ?Radiology ?DG Chest 2 View ? ?Result Date: 01/06/2022 ?CLINICAL DATA:  56 year old male with cough and shortness of breath. Sore throat and chills. EXAM: CHEST - 2 VIEW COMPARISON:  Chest CT 11/30/2021 and earlier. FINDINGS: PA and lateral views at 0722 hours. Chronically large lung volumes are stable. Normal cardiac size and mediastinal contours. Visualized tracheal air column is within normal limits. Lung markings appear stable since 2019 and both lungs appear clear with no pneumothorax or pleural effusion. Negative visible bowel gas and osseous structures. IMPRESSION: Pulmonary hyperinflation.  No acute cardiopulmonary abnormality. Electronically Signed   By: Genevie Ann M.D.   On: 01/06/2022 08:38   ? ?Procedures ?Procedures  ? ? ?Medications Ordered in ED ?Medications  ?acetaminophen (TYLENOL) tablet 650 mg (650 mg Oral Given 01/06/22 0711)  ? ? ?ED Course/ Medical Decision Making/ A&P ?  ?                        ?Medical Decision Making ?Amount and/or Complexity of Data Reviewed ?Radiology: ordered. ? ?Risk ?OTC drugs. ? ? ?Steven Li is a 56 y.o. male who presents to the emergency department with 3-day history of generalized myalgias, sore throat, nausea, vomiting, and diarrhea.  My differential diagnosis includes viral URI, viral gastroenteritis.  I have a low suspicion for pneumonia at this time.  I doubt pulmonary embolism or pneumothorax.  Clinically, patient has no signs of acute abdomen at this time.  I personally ordered and interpreted labs and imaging to include respiratory panel and strep PCR which were negative.  Chest x-ray shows evidence of hyperinflation but no signs of pneumonia.  On repeat vital signs, patient's tachycardia has improved dramatically spontaneously.  I have given him Tylenol in the emergency department which improved  his myalgias.  I will send him home with prescription for Zofran and Tessalon Per[place].  Patient notified of all labs and imaging today.  Strict return precautions were discussed.  He is safe for discharge. ? ?Final Clinical Impression(s) / ED Diagnoses ?Final diagnoses:  ?Viral syndrome  ? ? ?Rx / DC Orders ?ED Discharge Orders   ? ?      Ordered  ?  benzonatate (TESSALON) 100 MG capsule  Every 8 hours,   Status:  Discontinued       ? 01/06/22 0848  ?  ondansetron (ZOFRAN) 4 MG tablet  Every 6 hours       ? 01/06/22 0849  ?  benzonatate (TESSALON) 100 MG capsule  Every 8 hours       ? 01/06/22 0849  ? ?  ?  ? ?  ? ? ?  ?Myna Bright Reyno, PA-C ?01/06/22 7129 ? ?  ?Hayden Rasmussen, MD ?01/06/22 1837 ? ?

## 2022-01-07 ENCOUNTER — Other Ambulatory Visit: Payer: Self-pay

## 2022-01-11 ENCOUNTER — Other Ambulatory Visit: Payer: Self-pay

## 2022-01-21 ENCOUNTER — Other Ambulatory Visit: Payer: Self-pay

## 2022-01-23 ENCOUNTER — Other Ambulatory Visit: Payer: Self-pay

## 2022-02-10 ENCOUNTER — Emergency Department (HOSPITAL_COMMUNITY)
Admission: EM | Admit: 2022-02-10 | Discharge: 2022-02-10 | Disposition: A | Discharge status: Home / Self Care | Payer: Self-pay | Attending: Emergency Medicine | Admitting: Emergency Medicine

## 2022-02-10 ENCOUNTER — Other Ambulatory Visit: Payer: Self-pay

## 2022-02-10 ENCOUNTER — Emergency Department (HOSPITAL_COMMUNITY): Payer: Self-pay

## 2022-02-10 ENCOUNTER — Encounter (HOSPITAL_COMMUNITY): Payer: Self-pay | Admitting: Emergency Medicine

## 2022-02-10 DIAGNOSIS — I10 Essential (primary) hypertension: Secondary | ICD-10-CM | POA: Insufficient documentation

## 2022-02-10 DIAGNOSIS — F10230 Alcohol dependence with withdrawal, uncomplicated: Secondary | ICD-10-CM | POA: Insufficient documentation

## 2022-02-10 DIAGNOSIS — M791 Myalgia, unspecified site: Secondary | ICD-10-CM | POA: Insufficient documentation

## 2022-02-10 DIAGNOSIS — R630 Anorexia: Secondary | ICD-10-CM | POA: Insufficient documentation

## 2022-02-10 DIAGNOSIS — F10930 Alcohol use, unspecified with withdrawal, uncomplicated: Secondary | ICD-10-CM

## 2022-02-10 DIAGNOSIS — Z20822 Contact with and (suspected) exposure to covid-19: Secondary | ICD-10-CM | POA: Insufficient documentation

## 2022-02-10 DIAGNOSIS — R110 Nausea: Secondary | ICD-10-CM | POA: Insufficient documentation

## 2022-02-10 DIAGNOSIS — R6883 Chills (without fever): Secondary | ICD-10-CM | POA: Insufficient documentation

## 2022-02-10 DIAGNOSIS — Z79899 Other long term (current) drug therapy: Secondary | ICD-10-CM | POA: Insufficient documentation

## 2022-02-10 DIAGNOSIS — R531 Weakness: Secondary | ICD-10-CM | POA: Insufficient documentation

## 2022-02-10 DIAGNOSIS — R000 Tachycardia, unspecified: Secondary | ICD-10-CM | POA: Insufficient documentation

## 2022-02-10 DIAGNOSIS — R109 Unspecified abdominal pain: Secondary | ICD-10-CM | POA: Insufficient documentation

## 2022-02-10 LAB — HEPATIC FUNCTION PANEL
ALT: 26 U/L (ref 0–44)
AST: 62 U/L — ABNORMAL HIGH (ref 15–41)
Albumin: 3.8 g/dL (ref 3.5–5.0)
Alkaline Phosphatase: 75 U/L (ref 38–126)
Bilirubin, Direct: 0.2 mg/dL (ref 0.0–0.2)
Indirect Bilirubin: 0.6 mg/dL (ref 0.3–0.9)
Total Bilirubin: 0.8 mg/dL (ref 0.3–1.2)
Total Protein: 7.1 g/dL (ref 6.5–8.1)

## 2022-02-10 LAB — URINALYSIS, ROUTINE W REFLEX MICROSCOPIC
Bilirubin Urine: NEGATIVE
Glucose, UA: NEGATIVE mg/dL
Hgb urine dipstick: NEGATIVE
Ketones, ur: NEGATIVE mg/dL
Leukocytes,Ua: NEGATIVE
Nitrite: NEGATIVE
Protein, ur: NEGATIVE mg/dL
Specific Gravity, Urine: 1.012 (ref 1.005–1.030)
pH: 5 (ref 5.0–8.0)

## 2022-02-10 LAB — RESP PANEL BY RT-PCR (FLU A&B, COVID) ARPGX2
Influenza A by PCR: NEGATIVE
Influenza B by PCR: NEGATIVE
SARS Coronavirus 2 by RT PCR: NEGATIVE

## 2022-02-10 LAB — BASIC METABOLIC PANEL
Anion gap: 14 (ref 5–15)
BUN: 8 mg/dL (ref 6–20)
CO2: 24 mmol/L (ref 22–32)
Calcium: 9.2 mg/dL (ref 8.9–10.3)
Chloride: 101 mmol/L (ref 98–111)
Creatinine, Ser: 0.82 mg/dL (ref 0.61–1.24)
GFR, Estimated: >60 mL/min (ref >60)
Glucose, Bld: 113 mg/dL — ABNORMAL HIGH (ref 70–99)
Potassium: 4.4 mmol/L (ref 3.5–5.1)
Sodium: 139 mmol/L (ref 135–145)

## 2022-02-10 LAB — CBC
HCT: 39.4 % (ref 39.0–52.0)
Hemoglobin: 14.1 g/dL (ref 13.0–17.0)
MCH: 33.3 pg (ref 26.0–34.0)
MCHC: 35.8 g/dL (ref 30.0–36.0)
MCV: 93.1 fL (ref 80.0–100.0)
Platelets: 218 10*3/uL (ref 150–400)
RBC: 4.23 MIL/uL (ref 4.22–5.81)
RDW: 12.6 % (ref 11.5–15.5)
WBC: 4.8 10*3/uL (ref 4.0–10.5)
nRBC: 0 % (ref 0.0–0.2)

## 2022-02-10 LAB — CBG MONITORING, ED: Glucose-Capillary: 107 mg/dL — ABNORMAL HIGH (ref 70–99)

## 2022-02-10 LAB — LIPASE, BLOOD: Lipase: 30 U/L (ref 11–51)

## 2022-02-10 LAB — CK: Total CK: 181 U/L (ref 49–397)

## 2022-02-10 MED ORDER — LORAZEPAM 2 MG/ML IJ SOLN
0.0000 mg | Freq: Four times a day (QID) | INTRAMUSCULAR | Status: DC
  Administered 2022-02-10: 2 mg via INTRAVENOUS
  Filled 2022-02-10: qty 1

## 2022-02-10 MED ORDER — SODIUM CHLORIDE 0.9 % IV BOLUS
1000.0000 mL | Freq: Once | INTRAVENOUS | Status: AC
  Administered 2022-02-10: 1000 mL via INTRAVENOUS

## 2022-02-10 MED ORDER — THIAMINE HCL 100 MG/ML IJ SOLN
100.0000 mg | Freq: Every day | INTRAMUSCULAR | Status: DC
  Administered 2022-02-10: 100 mg via INTRAVENOUS
  Filled 2022-02-10: qty 2

## 2022-02-10 MED ORDER — LORAZEPAM 1 MG PO TABS: 0.0000 mg | ORAL_TABLET | Freq: Two times a day (BID) | ORAL | Status: DC

## 2022-02-10 MED ORDER — ONDANSETRON 4 MG PO TBDP: 4.0000 mg | ORAL_TABLET | Freq: Three times a day (TID) | ORAL | 0 refills | Status: DC | PRN

## 2022-02-10 MED ORDER — LORAZEPAM 2 MG/ML IJ SOLN: 0.0000 mg | Freq: Two times a day (BID) | INTRAMUSCULAR | Status: DC

## 2022-02-10 MED ORDER — LORAZEPAM 1 MG PO TABS: 0.0000 mg | ORAL_TABLET | Freq: Four times a day (QID) | ORAL | Status: DC

## 2022-02-10 MED ORDER — SODIUM CHLORIDE 0.9% FLUSH: 3.0000 mL | Freq: Once | INTRAVENOUS | Status: DC

## 2022-02-10 MED ORDER — THIAMINE HCL 100 MG PO TABS: 100.0000 mg | ORAL_TABLET | Freq: Every day | ORAL | Status: DC

## 2022-02-10 NOTE — Discharge Instructions (Addendum)
Please follow up with your PCP for further evaluation of your symptoms today. They may have been related to alcohol withdrawal. Consider cutting back on your alcohol use.  ? ?You have been discharged with a short course of anti nausea medication to take as needed.  ? ?Drink plenty of water to stay hydrated.  ? ?Return to the ED for any  new/worsening symptoms ?

## 2022-02-10 NOTE — ED Provider Notes (Signed)
?[facility] ?Provider Note ? ? ?CSN: [id number] ?Arrival date & time: 02/10/22  4196 ? ?  ? ?History ? ?Chief Complaint  ?Patient presents with  ? Weakness  ? ? ?Steven Li is a 56 y.o. male with PMHx HTN, HLD, anemia, alcohol dependence who presents to the ED today with complaint of gradual onset, constant, generalized weakness/fatigue that began 3 days ago. Pt also complains of chills, nausea, dry heaving, lack of appetite, and diffuse body aches. He denies fevers. No known recent sick contacts. Denies cough, SOB, chest pain, sore throat, ear pain, headache, vomiting, diarrhea, constipation, melena, or any other associated symptoms. Pt last drank last night. Has never gone through alcohol withdrawal.  ? ?The history is provided by the patient and medical records.  ? ?  ? ?Home Medications ?Prior to Admission medications   ?Medication Sig Start Date End Date Taking? Authorizing Provider  ?ondansetron (ZOFRAN-ODT) 4 MG disintegrating tablet Take 1 tablet (4 mg total) by mouth every 8 (eight) hours as needed for nausea or vomiting. 02/10/22  Yes Cameshia Cressman, PA-C  ?albuterol (VENTOLIN HFA) 108 (90 Base) MCG/ACT inhaler Inhale 1-2 puffs into the lungs every 6 (six) hours as needed for wheezing or shortness of breath. 12/18/21   Charlott Rakes, MD  ?amLODipine (NORVASC) 10 MG tablet Take 1 tablet (10 mg total) by mouth daily. 11/27/21   Charlott Rakes, MD  ?atorvastatin (LIPITOR) 20 MG tablet Take 1 tablet (20 mg total) by mouth daily. 11/28/21   Charlott Rakes, MD  ?benzonatate (TESSALON) 100 MG capsule Take 1 capsule (100 mg total) by mouth every 8 (eight) hours. 01/06/22   Hendricks Limes, PA-C  ?gabapentin (NEURONTIN) 300 MG capsule Take 1 capsule (300 mg total) by mouth at bedtime. 11/27/21   Charlott Rakes, MD  ?ondansetron (ZOFRAN) 4 MG tablet Take 1 tablet (4 mg total) by mouth every 6 (six) hours. 01/06/22   Hendricks Limes, PA-C  ?thiamine 100 MG tablet Take 1 tablet  (100 mg total) by mouth daily. 11/27/21   Charlott Rakes, MD  ?   ? ?Allergies    ?Patient has no known allergies.   ? ?Review of Systems   ?Review of Systems  ?Constitutional:  Positive for chills and fatigue. Negative for fever.  ?HENT:  Negative for congestion, ear pain and sore throat.   ?Respiratory:  Negative for cough and shortness of breath.   ?Cardiovascular:  Negative for chest pain.  ?Gastrointestinal:  Positive for nausea. Negative for blood in stool, constipation, diarrhea and vomiting.  ?Musculoskeletal:  Positive for myalgias.  ?Neurological:  Positive for weakness (generalized).  ?All other systems reviewed and are negative. ? ?Physical Exam ?Updated Vital Signs ?BP (!) 135/102   Pulse (!) 128   Temp 98.5 ?F (36.9 ?C) (Oral)   Resp 17   SpO2 96%  ? ?Physical Exam ?Vitals and nursing note reviewed.  ?Constitutional:   ?   Appearance: He is not ill-appearing.  ?   Comments: Tremulous  ?HENT:  ?   Head: Normocephalic and atraumatic.  ?   Mouth/Throat:  ?   Mouth: Mucous membranes are dry.  ?Eyes:  ?   Conjunctiva/sclera: Conjunctivae normal.  ?Cardiovascular:  ?   Rate and Rhythm: Regular rhythm. Tachycardia present.  ?   Comments: Tachy with HR 120-130's ?Pulmonary:  ?   Effort: Pulmonary effort is normal.  ?   Breath sounds: Normal breath sounds. No wheezing, rhonchi or rales.  ?Abdominal:  ?  Palpations: Abdomen is soft.  ?   Tenderness: There is abdominal tenderness. There is no guarding or rebound.  ?Musculoskeletal:     ?   General: Tenderness present.  ?   Cervical back: Neck supple.  ?   Comments: Diffuse TTP   ?Skin: ?   General: Skin is warm and dry.  ?   Coloration: Skin is not jaundiced.  ?Neurological:  ?   Mental Status: He is alert and oriented to person, place, and time.  ? ? ?ED Results / Procedures / Treatments   ?Labs ?(all labs ordered are listed, but only abnormal results are displayed) ?Labs Reviewed  ?BASIC METABOLIC PANEL - Abnormal; Notable for the following components:  ?     Result Value  ? Glucose, Bld 113 (*)   ? All other components within normal limits  ?HEPATIC FUNCTION PANEL - Abnormal; Notable for the following components:  ? AST 62 (*)   ? All other components within normal limits  ?CBG MONITORING, ED - Abnormal; Notable for the following components:  ? Glucose-Capillary 107 (*)   ? All other components within normal limits  ?RESP PANEL BY RT-PCR (FLU A&B, COVID) ARPGX2  ?CBC  ?URINALYSIS, ROUTINE W REFLEX MICROSCOPIC  ?LIPASE, BLOOD  ?CK  ? ? ?EKG ?None ? ?Radiology ?DG Chest Port 1 View ? ?Result Date: 02/10/2022 ?CLINICAL DATA:  Shortness of breath, nausea, and body aches for 3 days. EXAM: PORTABLE CHEST 1 VIEW COMPARISON:  01/06/2022 FINDINGS: The heart size and mediastinal contours are within normal limits. Both lungs are clear. The visualized skeletal structures are unremarkable. IMPRESSION: No active disease. Electronically Signed   By: Marlaine Hind M.D.   On: 02/10/2022 10:32   ? ?Procedures ?Procedures  ? ? ?Medications Ordered in ED ?Medications  ?sodium chloride flush (NS) 0.9 % injection 3 mL (3 mLs Intravenous Not Given 02/10/22 0947)  ?LORazepam (ATIVAN) injection 0-4 mg (2 mg Intravenous Given 02/10/22 0947)  ?  Or  ?LORazepam (ATIVAN) tablet 0-4 mg ( Oral See Alternative 02/10/22 0947)  ?LORazepam (ATIVAN) injection 0-4 mg (has no administration in time range)  ?  Or  ?LORazepam (ATIVAN) tablet 0-4 mg (has no administration in time range)  ?thiamine tablet 100 mg ( Oral See Alternative 02/10/22 0946)  ?  Or  ?thiamine (B-1) injection 100 mg (100 mg Intravenous Given 02/10/22 0946)  ?sodium chloride 0.9 % bolus 1,000 mL (0 mLs Intravenous Stopped 02/10/22 1217)  ? ? ?ED Course/ Medical Decision Making/ A&P ?Clinical Course as of 02/10/22 1224  ?Sun Feb 10, 2022  ?1004 CIWA-Ar Total: 11 [MV]  ?  ?Clinical Course User Index ?[MV] Eustaquio Maize, PA-C  ? ?                        ?Medical Decision Making ?56 year old male who presents to the ED today with complaint of  generalized weakness/fatigue, chills, body aches, lack of appetite, dry heaving, nausea.  On arrival to the ED patient is afebrile however tachycardic at 125.  Remainder vitals are unremarkable.  On exam he is tremulous.  He does have history of alcohol abuse and last drink last night.  Concern for possibility of withdrawal syndrome at this time.  We will plan for CIWA score and fluids.  He is alert and oriented x3.  No concern for hepatic encephalopathy at this time.  On exam he is noted to have diffuse tenderness palpation throughout his muscles as well as his abdomen.  We will add on CK given complaint of body aches.  Will swab for COVID.  We will plan for labs including CBC, CMP, lipase, urinalysis. Will continue to monitor in the ED.  ? ?CIWA score of 11 ? ?EKG with sinus tachycardia and LVH ?CBC without leukocytosis. Hgb stable at 14.1.  ?BMP with glucose 113, no other electrolyte abnormalities.  ?LFTs without elevation ?Lipase WNL at 30 ?COVID and flu negative ?CK 181 ? ?CXR without signs of infection ? ?On repeat evaluation resting comfortably. HR in the 90s. CIWA of 2. Pt reports improvement in symptoms and states he is ready to go home. He is not ready to quit drinking and therefore not discharged with Librium taper. Pt instructed to follow up with PCP for ED follow up. He is in agreement with plan and stable for discharge home.   ? ?Amount and/or Complexity of Data Reviewed ?Labs: ordered. ?Radiology: ordered. ? ?Risk ?OTC drugs. ?Prescription drug management. ? ? ? ? ? ? ? ? ? ?Final Clinical Impression(s) / ED Diagnoses ?Final diagnoses:  ?Generalized weakness  ?Alcohol withdrawal syndrome without complication ([facility])  ? ? ?Rx / DC Orders ?ED Discharge Orders   ? ?      Ordered  ?  ondansetron (ZOFRAN-ODT) 4 MG disintegrating tablet  Every 8 hours PRN       ? 02/10/22 1223  ? ?  ?  ? ?  ? ? ? ?Discharge Instructions   ? ?  ?Please follow up with your PCP for further evaluation of your symptoms today. They  may have been related to alcohol withdrawal. Consider cutting back on your alcohol use.  ? ?You have been discharged with a short course of anti nausea medication to take as needed.  ? ?Drink plenty of water to s

## 2022-02-10 NOTE — ED Triage Notes (Signed)
C/o generalized weakness, nausea, SOB, and body aches since Friday. ?

## 2022-02-11 ENCOUNTER — Other Ambulatory Visit: Payer: Self-pay | Admitting: Family Medicine

## 2022-02-11 ENCOUNTER — Other Ambulatory Visit: Payer: Self-pay

## 2022-02-11 DIAGNOSIS — I10 Essential (primary) hypertension: Secondary | ICD-10-CM

## 2022-02-11 NOTE — Telephone Encounter (Signed)
Medication Refill - Medication:  ?amLODipine (NORVASC) 10 MG tablet  ? ?Has the patient contacted their pharmacy? Yes.   ?Contact PCP ? ?Preferred Pharmacy (with phone number or street name):  ?[facility] [address], [place], [place] [address]  ?Phone:  [phone]  Fax:  [phone]  ? ?Has the patient been seen for an appointment in the last year OR does the patient have an upcoming appointment? Yes.   ? ?Agent: Please be advised that RX refills may take up to 3 business days. We ask that you follow-up with your pharmacy. ?

## 2022-02-12 ENCOUNTER — Other Ambulatory Visit: Payer: Self-pay

## 2022-02-12 NOTE — Telephone Encounter (Signed)
last RF 11/27/21 #90 1 RF too soon ? ?Requested Prescriptions  ?Refused Prescriptions Disp Refills  ?? amLODipine (NORVASC) 10 MG tablet 90 tablet 1  ?  Sig: Take 1 tablet (10 mg total) by mouth daily.  ?  ? Cardiovascular: Calcium Channel Blockers 2 Failed - 02/11/2022  8:38 AM  ?  ?  Failed - Last BP in normal range  ?  BP Readings from Last 1 Encounters:  ?02/10/22 (!) 157/103  ?   ?  ?  Passed - Last Heart Rate in normal range  ?  Pulse Readings from Last 1 Encounters:  ?02/10/22 (!) 105  ?   ?  ?  Passed - Valid encounter within last 6 months  ?  Recent Outpatient Visits   ?      ? 2 months ago Screening for colon cancer  ? [place], Charlane Ferretti, MD  ? 3 months ago Essential hypertension  ? [place], RPH-CPP  ? 5 months ago Screening for colon cancer  ? [place] Charlott Rakes, MD  ? 1 year ago Alcohol abuse  ? [place], MD  ? 2 years ago Essential hypertension  ? [place] Charlott Rakes, MD  ?  ?  ?Future Appointments   ?        ? In 3 months Charlott Rakes, MD [facility]  ?  ? ?  ?  ?  ? ? ?

## 2022-03-12 ENCOUNTER — Other Ambulatory Visit: Payer: Self-pay

## 2022-03-29 ENCOUNTER — Other Ambulatory Visit: Payer: Self-pay

## 2022-03-29 ENCOUNTER — Emergency Department (HOSPITAL_COMMUNITY)
Admission: EM | Admit: 2022-03-29 | Discharge: 2022-03-29 | Disposition: A | Discharge status: Home / Self Care | Payer: Self-pay | Attending: Emergency Medicine | Admitting: Emergency Medicine

## 2022-03-29 ENCOUNTER — Encounter (HOSPITAL_COMMUNITY): Payer: Self-pay

## 2022-03-29 DIAGNOSIS — R1110 Vomiting, unspecified: Secondary | ICD-10-CM | POA: Insufficient documentation

## 2022-03-29 DIAGNOSIS — R5383 Other fatigue: Secondary | ICD-10-CM | POA: Insufficient documentation

## 2022-03-29 DIAGNOSIS — M7918 Myalgia, other site: Secondary | ICD-10-CM | POA: Insufficient documentation

## 2022-03-29 DIAGNOSIS — Z79899 Other long term (current) drug therapy: Secondary | ICD-10-CM | POA: Insufficient documentation

## 2022-03-29 DIAGNOSIS — M791 Myalgia, unspecified site: Secondary | ICD-10-CM

## 2022-03-29 DIAGNOSIS — I10 Essential (primary) hypertension: Secondary | ICD-10-CM | POA: Insufficient documentation

## 2022-03-29 LAB — CBC
HCT: 39.9 % (ref 39.0–52.0)
Hemoglobin: 14.3 g/dL (ref 13.0–17.0)
MCH: 33.2 pg (ref 26.0–34.0)
MCHC: 35.8 g/dL (ref 30.0–36.0)
MCV: 92.6 fL (ref 80.0–100.0)
Platelets: 254 10*3/uL (ref 150–400)
RBC: 4.31 MIL/uL (ref 4.22–5.81)
RDW: 12.1 % (ref 11.5–15.5)
WBC: 5.3 10*3/uL (ref 4.0–10.5)
nRBC: 0 % (ref 0.0–0.2)

## 2022-03-29 LAB — URINALYSIS, ROUTINE W REFLEX MICROSCOPIC
Bilirubin Urine: NEGATIVE
Glucose, UA: NEGATIVE mg/dL
Hgb urine dipstick: NEGATIVE
Ketones, ur: NEGATIVE mg/dL
Leukocytes,Ua: NEGATIVE
Nitrite: NEGATIVE
Protein, ur: 30 mg/dL — AB
Specific Gravity, Urine: 1.004 — ABNORMAL LOW (ref 1.005–1.030)
pH: 5 (ref 5.0–8.0)

## 2022-03-29 LAB — LIPASE, BLOOD: Lipase: 29 U/L (ref 11–51)

## 2022-03-29 LAB — COMPREHENSIVE METABOLIC PANEL
ALT: 28 U/L (ref 0–44)
AST: 64 U/L — ABNORMAL HIGH (ref 15–41)
Albumin: 3.9 g/dL (ref 3.5–5.0)
Alkaline Phosphatase: 87 U/L (ref 38–126)
Anion gap: 16 — ABNORMAL HIGH (ref 5–15)
BUN: 8 mg/dL (ref 6–20)
CO2: 22 mmol/L (ref 22–32)
Calcium: 9.1 mg/dL (ref 8.9–10.3)
Chloride: 101 mmol/L (ref 98–111)
Creatinine, Ser: 0.76 mg/dL (ref 0.61–1.24)
GFR, Estimated: >60 mL/min (ref >60)
Glucose, Bld: 90 mg/dL (ref 70–99)
Potassium: 4.1 mmol/L (ref 3.5–5.1)
Sodium: 139 mmol/L (ref 135–145)
Total Bilirubin: 0.5 mg/dL (ref 0.3–1.2)
Total Protein: 7.2 g/dL (ref 6.5–8.1)

## 2022-04-08 ENCOUNTER — Other Ambulatory Visit: Payer: Self-pay

## 2022-04-10 ENCOUNTER — Other Ambulatory Visit: Payer: Self-pay

## 2022-04-15 ENCOUNTER — Other Ambulatory Visit: Payer: Self-pay

## 2022-04-27 ENCOUNTER — Emergency Department (HOSPITAL_COMMUNITY): Payer: Self-pay

## 2022-04-27 ENCOUNTER — Encounter (HOSPITAL_COMMUNITY): Payer: Self-pay | Admitting: Emergency Medicine

## 2022-04-27 ENCOUNTER — Ambulatory Visit (HOSPITAL_COMMUNITY)
Admission: EM | Admit: 2022-04-27 | Discharge: 2022-04-27 | Disposition: A | Discharge status: Home / Self Care | Payer: Self-pay

## 2022-04-27 ENCOUNTER — Other Ambulatory Visit: Payer: Self-pay

## 2022-04-27 ENCOUNTER — Emergency Department (HOSPITAL_COMMUNITY)
Admission: EM | Admit: 2022-04-27 | Discharge: 2022-04-27 | Disposition: A | Discharge status: Home / Self Care | Payer: Self-pay | Attending: Emergency Medicine | Admitting: Emergency Medicine

## 2022-04-27 DIAGNOSIS — R1084 Generalized abdominal pain: Secondary | ICD-10-CM

## 2022-04-27 DIAGNOSIS — Z79899 Other long term (current) drug therapy: Secondary | ICD-10-CM | POA: Insufficient documentation

## 2022-04-27 DIAGNOSIS — E860 Dehydration: Secondary | ICD-10-CM | POA: Insufficient documentation

## 2022-04-27 DIAGNOSIS — F1020 Alcohol dependence, uncomplicated: Secondary | ICD-10-CM | POA: Insufficient documentation

## 2022-04-27 DIAGNOSIS — K2920 Alcoholic gastritis without bleeding: Secondary | ICD-10-CM

## 2022-04-27 DIAGNOSIS — Y900 Blood alcohol level of less than 20 mg/100 ml: Secondary | ICD-10-CM | POA: Insufficient documentation

## 2022-04-27 HISTORY — DX: Chronic obstructive pulmonary disease, unspecified: J44.9

## 2022-04-27 LAB — CBC WITH DIFFERENTIAL/PLATELET
Abs Immature Granulocytes: 0.01 10*3/uL (ref 0.00–0.07)
Basophils Absolute: 0.1 10*3/uL (ref 0.0–0.1)
Basophils Relative: 1 %
Eosinophils Absolute: 0.1 10*3/uL (ref 0.0–0.5)
Eosinophils Relative: 1 %
HCT: 36.1 % — ABNORMAL LOW (ref 39.0–52.0)
Hemoglobin: 12.6 g/dL — ABNORMAL LOW (ref 13.0–17.0)
Immature Granulocytes: 0 %
Lymphocytes Relative: 24 %
Lymphs Abs: 1.5 10*3/uL (ref 0.7–4.0)
MCH: 32.9 pg (ref 26.0–34.0)
MCHC: 34.9 g/dL (ref 30.0–36.0)
MCV: 94.3 fL (ref 80.0–100.0)
Monocytes Absolute: 0.9 10*3/uL (ref 0.1–1.0)
Monocytes Relative: 15 %
Neutro Abs: 3.7 10*3/uL (ref 1.7–7.7)
Neutrophils Relative %: 59 %
Platelets: 141 10*3/uL — ABNORMAL LOW (ref 150–400)
RBC: 3.83 MIL/uL — ABNORMAL LOW (ref 4.22–5.81)
RDW: 12.4 % (ref 11.5–15.5)
WBC: 6.2 10*3/uL (ref 4.0–10.5)
nRBC: 0 % (ref 0.0–0.2)

## 2022-04-27 LAB — I-STAT CHEM 8, ED
BUN: 11 mg/dL (ref 6–20)
Calcium, Ion: 0.98 mmol/L — ABNORMAL LOW (ref 1.15–1.40)
Chloride: 105 mmol/L (ref 98–111)
Creatinine, Ser: 0.7 mg/dL (ref 0.61–1.24)
Glucose, Bld: 90 mg/dL (ref 70–99)
HCT: 38 % — ABNORMAL LOW (ref 39.0–52.0)
Hemoglobin: 12.9 g/dL — ABNORMAL LOW (ref 13.0–17.0)
Potassium: 4.1 mmol/L (ref 3.5–5.1)
Sodium: 134 mmol/L — ABNORMAL LOW (ref 135–145)
TCO2: 21 mmol/L — ABNORMAL LOW (ref 22–32)

## 2022-04-27 LAB — COMPREHENSIVE METABOLIC PANEL
ALT: 32 U/L (ref 0–44)
AST: 72 U/L — ABNORMAL HIGH (ref 15–41)
Albumin: 4 g/dL (ref 3.5–5.0)
Alkaline Phosphatase: 86 U/L (ref 38–126)
Anion gap: 12 (ref 5–15)
BUN: 10 mg/dL (ref 6–20)
CO2: 23 mmol/L (ref 22–32)
Calcium: 9.2 mg/dL (ref 8.9–10.3)
Chloride: 102 mmol/L (ref 98–111)
Creatinine, Ser: 0.81 mg/dL (ref 0.61–1.24)
GFR, Estimated: >60 mL/min (ref >60)
Glucose, Bld: 89 mg/dL (ref 70–99)
Potassium: 4.2 mmol/L (ref 3.5–5.1)
Sodium: 137 mmol/L (ref 135–145)
Total Bilirubin: 0.7 mg/dL (ref 0.3–1.2)
Total Protein: 7.3 g/dL (ref 6.5–8.1)

## 2022-04-27 LAB — LIPASE, BLOOD: Lipase: 29 U/L (ref 11–51)

## 2022-04-27 LAB — ETHANOL: Alcohol, Ethyl (B): <10 mg/dL (ref <10)

## 2022-04-27 MED ORDER — CHLORDIAZEPOXIDE HCL 25 MG PO CAPS
ORAL_CAPSULE | ORAL | 0 refills | Status: DC
  Filled 2022-04-27: qty 10, 3d supply, fill #0

## 2022-04-27 MED ORDER — ONDANSETRON HCL 4 MG/2ML IJ SOLN
4.0000 mg | Freq: Once | INTRAMUSCULAR | Status: AC
  Administered 2022-04-27: 4 mg via INTRAVENOUS
  Filled 2022-04-27: qty 2

## 2022-04-27 MED ORDER — ESOMEPRAZOLE MAGNESIUM 40 MG PO CPDR
40.0000 mg | DELAYED_RELEASE_CAPSULE | Freq: Every day | ORAL | 0 refills | Status: DC
  Filled 2022-04-27: qty 30, 30d supply, fill #0

## 2022-04-27 MED ORDER — ONDANSETRON 4 MG PO TBDP: 4.0000 mg | ORAL_TABLET | Freq: Once | ORAL | Status: DC

## 2022-04-27 MED ORDER — SODIUM CHLORIDE 0.9 % IV BOLUS
1000.0000 mL | Freq: Once | INTRAVENOUS | Status: AC
  Administered 2022-04-27: 1000 mL via INTRAVENOUS

## 2022-04-27 MED ORDER — IOHEXOL 300 MG/ML  SOLN
100.0000 mL | Freq: Once | INTRAMUSCULAR | Status: AC | PRN
  Administered 2022-04-27: 100 mL via INTRAVENOUS

## 2022-04-27 MED ORDER — ONDANSETRON 4 MG PO TBDP
ORAL_TABLET | ORAL | 0 refills | Status: DC
  Filled 2022-04-27: qty 10, 2d supply, fill #0

## 2022-04-27 MED ORDER — PANTOPRAZOLE SODIUM 40 MG IV SOLR
40.0000 mg | Freq: Once | INTRAVENOUS | Status: AC
Start: 2022-04-27 — End: 2022-04-27
  Administered 2022-04-27: 40 mg via INTRAVENOUS
  Filled 2022-04-27: qty 10

## 2022-04-27 MED ORDER — HYDROMORPHONE HCL 1 MG/ML IJ SOLN
1.0000 mg | Freq: Once | INTRAMUSCULAR | Status: AC
  Administered 2022-04-27: 1 mg via INTRAVENOUS
  Filled 2022-04-27: qty 1

## 2022-04-27 NOTE — Discharge Instructions (Addendum)
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  Please avoid drinking alcohol.  Stay hydrated.  Take Zofran for nausea.  Take Nexium daily  You can take Librium to help you with withdrawal symptoms  See your doctor for follow-up.  See GI for follow-up  Return to ED if you have worse abdominal pain, vomiting, dehydration

## 2022-04-27 NOTE — ED Triage Notes (Signed)
Pt sent by UC for eval of possible pancreatitis.  Hx of same

## 2022-04-27 NOTE — ED Provider Triage Note (Signed)
Emergency Medicine Provider Triage Evaluation Note  Steven Li , a 56 y.o. male  was evaluated in triage.  Pt complains of mid abdominal pain x 3 days with vomiting, no etoh in 3 days since symptoms started, yellow watery stools. No blood in emesis. Took Pepto which made stool dark.  Review of Systems  Positive: Abdominal pain, n.v.d  Negative: fever  Physical Exam  BP (!) 140/105   Pulse (!) 103   Temp 98.3 F (36.8 C) (Oral)   Resp 18   SpO2 98%  Gen:   Awake, no distress   Resp:  Normal effort  MSK:   Moves extremities without difficulty  Other:    Medical Decision Making  Medically screening exam initiated at 2:41 PM.  Appropriate orders placed.  Steven Li was informed that the remainder of the evaluation will be completed by another provider, this initial triage assessment does not replace that evaluation, and the importance of remaining in the ED until their evaluation is complete.     Tacy Learn, PA-C 04/27/22 1442

## 2022-04-27 NOTE — ED Triage Notes (Signed)
States abd pain for a 3 days, nausea no vomiting. Stools loose and has taken Pepto without relief.  Also noticed hands and lips peeling as if "dehydrated"

## 2022-04-27 NOTE — ED Notes (Signed)
Patient is being discharged from the [facility] and sent to the Emergency Department via private vehicle. Per A. White, NP, patient is stable but in need of higher level of care due to abdominal pain. Patient is aware and verbalizes understanding of plan of care.  Vitals:   04/27/22 1339  BP: (!) 165/107  Pulse: (!) 104  Temp: 98.2 F (36.8 C)  SpO2: 100%

## 2022-04-27 NOTE — ED Provider Notes (Signed)
[facility]    CSN: [id number] Arrival date & time: 04/27/22  1322      History   Chief Complaint Chief Complaint  Patient presents with   Abdominal Pain    HPI Steven Li is a 56 y.o. male.   Presents with remittent centralized abdominal pain beginning 3 days ago with associated nausea without vomiting.  Pain does not radiate, rated a 9 out of 10, described as discomfort.  Endorses he has been unable to tolerate food and when attempting to drink water it elicits severe pain from the abdomen.  Feels as if he is becoming dehydrated and endorses that his lips feel dry and his bilateral hands have begun to peel.  Has attempted use of Pepto-Bismol without relief.  Denies dietary changes, recent travel, abdominal bloating, increased gas production, heartburn or indigestion, fever, chills, diarrhea or constipation.  History of alcohol abuse, last drink 3 days ago prior to symptoms beginning.     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   Essential hypertension 09/07/2018   Pancreatitis, alcoholic, acute 75/64/3329   Pancreatic pseudocyst 04/17/2018   Nicotine dependence, cigarettes, uncomplicated 51/88/4166   Iron overload 10/28/2016   Hyperbilirubinemia 10/03/2016   Elevated troponin 10/03/2016   Anemia    Abnormal LFTs    Acute upper GI bleed    Encephalopathy, hepatic ([facility])    Anemia due to blood loss, acute    Hepatitis 10/01/2016   Alcohol abuse 10/01/2016   Aortic atherosclerosis ([place]) 10/01/2016   Transaminitis 09/30/2016   Colitis     Past Surgical History:  Procedure Laterality Date   NO PAST SURGERIES          Home Medications    Prior to Admission medications   Medication Sig Start Date End Date Taking? Authorizing Provider  albuterol (VENTOLIN HFA) 108 (90 Base) MCG/ACT inhaler Inhale 1-2 puffs into the lungs every 6 (six) hours as needed for wheezing or shortness of breath. 12/18/21   Charlott Rakes, MD  amLODipine (NORVASC) 10 MG tablet Take 1 tablet (10 mg total) by mouth daily. 11/27/21   Charlott Rakes, MD  atorvastatin (LIPITOR) 20 MG tablet Take 1 tablet (20 mg total) by mouth daily. 11/28/21   Charlott Rakes, MD  benzonatate (TESSALON) 100 MG capsule Take 1 capsule (100 mg total) by mouth every 8 (eight) hours. 01/06/22   Myna Bright M, PA-C  gabapentin (NEURONTIN) 300 MG capsule Take 1 capsule (300 mg total) by mouth at bedtime. 11/27/21   Charlott Rakes, MD  ondansetron (ZOFRAN) 4 MG tablet Take 1 tablet (4 mg total) by mouth every 6 (six) hours. 01/06/22   Myna Bright M, PA-C  ondansetron (ZOFRAN-ODT) 4 MG disintegrating tablet Take 1 tablet (4 mg total) by mouth every 8 (eight) hours as needed for nausea or vomiting. 02/10/22   Eustaquio Maize, PA-C  thiamine 100 MG tablet Take 1 tablet (100 mg total) by mouth daily. 11/27/21   Charlott Rakes, MD    Family History Family History  Problem Relation Age of Onset   Cirrhosis Mother        alcoholic  Stomach cancer Mother    Cirrhosis Father        alcoholic    Diabetes Sister     Social History Social History   Tobacco Use   Smoking status: Every Day    Packs/day: 0.50    Years: 33.00    Total pack years: 16.50    Types: Cigarettes   Smokeless tobacco: Never  Vaping Use   Vaping Use: Never used  Substance Use Topics   Alcohol use: Yes    Alcohol/week: 42.0 standard drinks of alcohol    Types: 42 Cans of beer per week    Comment: 04/27/2022 6 pack a day   Drug use: Not Currently    Types: Marijuana    Comment: 04/17/2018 "qd"     Allergies   Patient has no known allergies.   Review of  Systems Review of Systems  Constitutional: Negative.   Gastrointestinal:  Positive for abdominal pain and nausea. Negative for abdominal distention, anal bleeding, blood in stool, constipation, diarrhea, rectal pain and vomiting.  Skin: Negative.   Neurological: Negative.      Physical Exam Triage Vital Signs ED Triage Vitals  Enc Vitals Group     BP 04/27/22 1339 (!) 165/107     Pulse Rate 04/27/22 1339 (!) 104     Resp --      Temp 04/27/22 1339 98.2 F (36.8 C)     Temp Source 04/27/22 1339 Oral     SpO2 04/27/22 1339 100 %     Weight --      Height --      Head Circumference --      Peak Flow --      Pain Score 04/27/22 1340 9     Pain Loc --      Pain Edu? --      Excl. in [place]? --    No data found.  Updated Vital Signs BP (!) 165/107 (BP Location: Left Arm)   Pulse (!) 104   Temp 98.2 F (36.8 C) (Oral)   SpO2 100%   Visual Acuity Right Eye Distance:   Left Eye Distance:   Bilateral Distance:    Right Eye Near:   Left Eye Near:    Bilateral Near:     Physical Exam   UC Treatments / Results  Labs (all labs ordered are listed, but only abnormal results are displayed) Labs Reviewed - No data to display  EKG   Radiology No results found.  Procedures Procedures (including critical care time)  Medications Ordered in UC Medications - No data to display  Initial Impression / Assessment and Plan / UC Course  I have reviewed the triage vital signs and the nursing notes.  Pertinent labs & imaging results that were available during my care of the patient were reviewed by me and considered in my medical decision making (see chart for details).  Generalized abdominal pain  Patient sent to the nearest emergency department for further evaluation and management of abdominal pain, on exam significant tenderness is elicited with minimal palpation to the entirety of the abdomen and patient is jerking from table during exam, high suspicion for pancreatitis due  to history and alcohol use, discussed with patient, to escort self  Final Clinical Impressions(s) / UC Diagnoses   Final diagnoses:  None   Discharge Instructions   None    ED Prescriptions   None    PDMP not reviewed this encounter.   Hans Eden, NP 04/27/22 1418

## 2022-04-27 NOTE — ED Provider Notes (Signed)
[facility] EMERGENCY DEPARTMENT Provider Note   CSN: [id number] Arrival date & time: 04/27/22  1420     History  Chief Complaint  Patient presents with   Abdominal Pain    Steven Li is a 56 y.o. male history of pancreatitis, alcohol abuse here presenting with nausea vomiting and epigastric pain.  Patient states that for the last 3 days, he has been having severe 10 out of 10 epigastric pain.  Patient states that he usually drinks alcohol daily but cannot drink alcohol due to the pain.  Patient states that he also has vomiting and cannot even keep down water.  Patient has a previous history of alcohol induced pancreatitis.  Patient denies tremors or withdrawal symptoms  The history is provided by the patient.       Home Medications Prior to Admission medications   Medication Sig Start Date End Date Taking? Authorizing Provider  albuterol (VENTOLIN HFA) 108 (90 Base) MCG/ACT inhaler Inhale 1-2 puffs into the lungs every 6 (six) hours as needed for wheezing or shortness of breath. 12/18/21   Charlott Rakes, MD  amLODipine (NORVASC) 10 MG tablet Take 1 tablet (10 mg total) by mouth daily. 11/27/21   Charlott Rakes, MD  atorvastatin (LIPITOR) 20 MG tablet Take 1 tablet (20 mg total) by mouth daily. 11/28/21   Charlott Rakes, MD  gabapentin (NEURONTIN) 300 MG capsule Take 1 capsule (300 mg total) by mouth at bedtime. 11/27/21   Charlott Rakes, MD      Allergies    Patient has no known allergies.    Review of Systems   Review of Systems  Gastrointestinal:  Positive for abdominal pain and vomiting.  All other systems reviewed and are negative.   Physical Exam Updated Vital Signs BP (!) 173/88 (BP Location: Left Arm)   Pulse 60   Temp 98.5 F (36.9 C) (Oral)   Resp 16   SpO2 93%  Physical Exam Vitals and nursing note reviewed.  Constitutional:      Comments: Uncomfortable, dehydrated  HENT:     Head: Normocephalic.     Mouth/Throat:     Pharynx:  Oropharynx is clear.  Eyes:     Extraocular Movements: Extraocular movements intact.     Pupils: Pupils are equal, round, and reactive to light.  Cardiovascular:     Rate and Rhythm: Normal rate and regular rhythm.  Pulmonary:     Effort: Pulmonary effort is normal.     Breath sounds: Normal breath sounds.  Abdominal:     General: Abdomen is flat.     Comments: + Epigastric tenderness  Skin:    General: Skin is warm.     Capillary Refill: Capillary refill takes less than 2 seconds.  Neurological:     General: No focal deficit present.     Mental Status: He is oriented to person, place, and time.  Psychiatric:        Mood and Affect: Mood normal.        Behavior: Behavior normal.     ED Results / Procedures / Treatments   Labs (all labs ordered are listed, but only abnormal results are displayed) Labs Reviewed  CBC WITH DIFFERENTIAL/PLATELET - Abnormal; Notable for the following components:      Result Value   RBC 3.83 (*)    Hemoglobin 12.6 (*)    HCT 36.1 (*)    Platelets 141 (*)    All other components within normal limits  COMPREHENSIVE METABOLIC PANEL - Abnormal;  Notable for the following components:   AST 72 (*)    All other components within normal limits  I-STAT CHEM 8, ED - Abnormal; Notable for the following components:   Sodium 134 (*)    Calcium, Ion 0.98 (*)    TCO2 21 (*)    Hemoglobin 12.9 (*)    HCT 38.0 (*)    All other components within normal limits  LIPASE, BLOOD  ETHANOL  URINALYSIS, ROUTINE W REFLEX MICROSCOPIC    EKG None  Radiology CT ABDOMEN PELVIS W CONTRAST  Result Date: 04/27/2022 CLINICAL DATA:  Pancreatitis, acute, severe. Mid abdominal pain for 3 days with vomiting. EXAM: CT ABDOMEN AND PELVIS WITH CONTRAST TECHNIQUE: Multidetector CT imaging of the abdomen and pelvis was performed using the standard protocol following bolus administration of intravenous contrast. RADIATION DOSE REDUCTION: This exam was performed according to the  departmental dose-optimization program which includes automated exposure control, adjustment of the mA and/or kV according to patient size and/or use of iterative reconstruction technique. CONTRAST:  166m OMNIPAQUE IOHEXOL 300 MG/ML  SOLN COMPARISON:  11/18/2018. FINDINGS: Lower chest: No acute abnormality. Hepatobiliary: Subcentimeter hypodensities are present in the right lobe of the liver which are too small to further characterize. Fatty infiltration of the liver is noted. No biliary ductal dilatation. The gallbladder is without stones. Pancreas: Unremarkable. No pancreatic ductal dilatation or surrounding inflammatory changes. Spleen: Normal in size without focal abnormality. Adrenals/Urinary Tract: No adrenal nodule or mass. The kidneys enhance symmetrically. A stable hypodensity is noted in the lower pole the left kidney. No renal calculus or hydronephrosis. Marked bladder wall thickening is noted. Stomach/Bowel: Gastric wall thickening is noted. Appendix appears normal. No evidence of bowel wall thickening, distention, or inflammatory changes. No free air or pneumatosis. Vascular/Lymphatic: Aortic atherosclerosis. No enlarged abdominal or pelvic lymph nodes. Reproductive: Prostate is unremarkable. Other: No abdominopelvic ascites. Musculoskeletal: A sclerotic lesion is seen in the femoral head on the right, possible avascular necrosis. No acute osseous abnormality. IMPRESSION: 1. No evidence of pancreatitis. 2. Gastric wall thickening suggesting gastritis. 3. Marked bladder wall thickening suggesting infectious or inflammatory cystitis. 4. Hepatic steatosis. 5. Aortic atherosclerosis. Electronically Signed   By: LBrett FairyM.D.   On: 04/27/2022 22:53    Procedures Procedures    Medications Ordered in ED Medications  ondansetron (ZOFRAN) injection 4 mg (4 mg Intravenous Given 04/27/22 2013)  sodium chloride 0.9 % bolus 1,000 mL (1,000 mLs Intravenous New Bag/Given 04/27/22 2013)  HYDROmorphone  (DILAUDID) injection 1 mg (1 mg Intravenous Given 04/27/22 2013)  pantoprazole (PROTONIX) injection 40 mg (40 mg Intravenous Given 04/27/22 2013)  iohexol (OMNIPAQUE) 300 MG/ML solution 100 mL (100 mLs Intravenous Contrast Given 04/27/22 2231)    ED Course/ Medical Decision Making/ A&P                           Medical Decision Making Steven GATLIFFis a 56y.o. male here presenting with abdominal pain and vomiting.  Patient has history of alcohol induced pancreatitis.  I think this is likely pancreatitis versus gastritis.  Plan to get CBC and CMP and lipase and CT abdomen pelvis. We will hydrate and give nausea medicine and pain medicine and reassess  10:58 PM I reviewed patient's labs and independently interpreted imaging studies.  Labs showed normal LFTs and normal lipase level.  No pancreatitis on CT.  Patient does have some gastric wall thickening suggestive of gastritis.  Patient bladder wall slightly thickened  but patient does not have any urinary symptoms I do not think he has UTI.  Patient able to tolerate p.o. in the ED.  We will discharge patient home with Zofran and Nexium and I encouraged him to stop drinking alcohol.  I think he likely has alcoholic gastritis  Problems Addressed: Acute alcoholic gastritis without hemorrhage: acute illness or injury  Amount and/or Complexity of Data Reviewed Labs: ordered. Decision-making details documented in ED Course. Radiology: ordered and independent interpretation performed. Decision-making details documented in ED Course.  Risk Prescription drug management.    Final Clinical Impression(s) / ED Diagnoses Final diagnoses:  None    Rx / DC Orders ED Discharge Orders     None         Drenda Freeze, MD 04/27/22 2300

## 2022-04-27 NOTE — Discharge Instructions (Signed)
Please go to the nearest emergency department for further evaluation of possible pancreatitis, you have significant tenderness to your abdominal exam and as you have been unable to tolerate any food or fluids for the last 3 days it is possible that you are dehydrated and may need IV fluid

## 2022-04-29 ENCOUNTER — Other Ambulatory Visit: Payer: Self-pay

## 2022-05-13 ENCOUNTER — Emergency Department (HOSPITAL_COMMUNITY)
Admission: EM | Admit: 2022-05-13 | Discharge: 2022-05-13 | Disposition: A | Discharge status: Home / Self Care | Payer: Self-pay | Attending: Emergency Medicine | Admitting: Emergency Medicine

## 2022-05-13 ENCOUNTER — Emergency Department (HOSPITAL_COMMUNITY): Payer: Self-pay

## 2022-05-13 ENCOUNTER — Other Ambulatory Visit: Payer: Self-pay

## 2022-05-13 ENCOUNTER — Emergency Department (HOSPITAL_BASED_OUTPATIENT_CLINIC_OR_DEPARTMENT_OTHER): Payer: Self-pay

## 2022-05-13 DIAGNOSIS — J449 Chronic obstructive pulmonary disease, unspecified: Secondary | ICD-10-CM | POA: Insufficient documentation

## 2022-05-13 DIAGNOSIS — R609 Edema, unspecified: Secondary | ICD-10-CM

## 2022-05-13 DIAGNOSIS — Z79899 Other long term (current) drug therapy: Secondary | ICD-10-CM | POA: Insufficient documentation

## 2022-05-13 DIAGNOSIS — M7989 Other specified soft tissue disorders: Secondary | ICD-10-CM

## 2022-05-13 DIAGNOSIS — R600 Localized edema: Secondary | ICD-10-CM | POA: Insufficient documentation

## 2022-05-13 DIAGNOSIS — I10 Essential (primary) hypertension: Secondary | ICD-10-CM | POA: Insufficient documentation

## 2022-05-13 DIAGNOSIS — M5410 Radiculopathy, site unspecified: Secondary | ICD-10-CM

## 2022-05-13 DIAGNOSIS — G621 Alcoholic polyneuropathy: Secondary | ICD-10-CM

## 2022-05-13 LAB — CBC WITH DIFFERENTIAL/PLATELET
Abs Immature Granulocytes: 0.1 10*3/uL — ABNORMAL HIGH (ref 0.00–0.07)
Basophils Absolute: 0.1 10*3/uL (ref 0.0–0.1)
Basophils Relative: 2 %
Eosinophils Absolute: 0.3 10*3/uL (ref 0.0–0.5)
Eosinophils Relative: 4 %
HCT: 31.4 % — ABNORMAL LOW (ref 39.0–52.0)
Hemoglobin: 10.3 g/dL — ABNORMAL LOW (ref 13.0–17.0)
Immature Granulocytes: 1 %
Lymphocytes Relative: 24 %
Lymphs Abs: 1.8 10*3/uL (ref 0.7–4.0)
MCH: 32.3 pg (ref 26.0–34.0)
MCHC: 32.8 g/dL (ref 30.0–36.0)
MCV: 98.4 fL (ref 80.0–100.0)
Monocytes Absolute: 1.4 10*3/uL — ABNORMAL HIGH (ref 0.1–1.0)
Monocytes Relative: 18 %
Neutro Abs: 3.8 10*3/uL (ref 1.7–7.7)
Neutrophils Relative %: 51 %
Platelets: 355 10*3/uL (ref 150–400)
RBC: 3.19 MIL/uL — ABNORMAL LOW (ref 4.22–5.81)
RDW: 12.6 % (ref 11.5–15.5)
WBC: 7.5 10*3/uL (ref 4.0–10.5)
nRBC: 0 % (ref 0.0–0.2)

## 2022-05-13 LAB — URINALYSIS, ROUTINE W REFLEX MICROSCOPIC
Bilirubin Urine: NEGATIVE
Glucose, UA: NEGATIVE mg/dL
Hgb urine dipstick: NEGATIVE
Ketones, ur: NEGATIVE mg/dL
Leukocytes,Ua: NEGATIVE
Nitrite: NEGATIVE
Protein, ur: NEGATIVE mg/dL
Specific Gravity, Urine: 1.01 (ref 1.005–1.030)
pH: 6 (ref 5.0–8.0)

## 2022-05-13 LAB — BASIC METABOLIC PANEL
Anion gap: 10 (ref 5–15)
BUN: 14 mg/dL (ref 6–20)
CO2: 24 mmol/L (ref 22–32)
Calcium: 9.5 mg/dL (ref 8.9–10.3)
Chloride: 105 mmol/L (ref 98–111)
Creatinine, Ser: 1.06 mg/dL (ref 0.61–1.24)
GFR, Estimated: >60 mL/min (ref >60)
Glucose, Bld: 100 mg/dL — ABNORMAL HIGH (ref 70–99)
Potassium: 4.3 mmol/L (ref 3.5–5.1)
Sodium: 139 mmol/L (ref 135–145)

## 2022-05-13 MED ORDER — GABAPENTIN 100 MG PO CAPS
100.0000 mg | ORAL_CAPSULE | Freq: Every day | ORAL | 0 refills | Status: DC
  Filled 2022-05-13: qty 20, 20d supply, fill #0

## 2022-05-13 MED ORDER — KETOROLAC TROMETHAMINE 60 MG/2ML IM SOLN
60.0000 mg | Freq: Once | INTRAMUSCULAR | Status: AC
  Administered 2022-05-13: 60 mg via INTRAMUSCULAR
  Filled 2022-05-13: qty 2

## 2022-05-13 MED ORDER — GABAPENTIN 100 MG PO CAPS: 100.0000 mg | ORAL_CAPSULE | Freq: Every day | ORAL | 0 refills | Status: DC

## 2022-05-13 MED ORDER — METHYLPREDNISOLONE 4 MG PO TBPK
ORAL_TABLET | ORAL | 0 refills | Status: DC
  Filled 2022-05-13: qty 21, 6d supply, fill #0

## 2022-05-13 NOTE — ED Triage Notes (Signed)
Pt c/o bilateral ankle swelling, R worse than L, worsening over 3 days. Denies CP, SHOB, endorses compliance w home medications. Pt also endorses "whole R leg pain, from the top to the bottom." Ambulatory w R sided limp in triage.

## 2022-05-13 NOTE — Discharge Instructions (Addendum)
You were evaluated in the Emergency Department and after careful evaluation, we did not find any emergent condition requiring admission or further testing in the hospital.  Your work-up today was overall reassuring.  I suspect that your leg swelling is due to you standing prolonged periods of time at work.  I recommend that you buy some compression stockings for this.  As for your shooting pain I suspect this is secondary to radiculopathy or pinched nerve.  You may take 100 mg of gabapentin at night for this.  It is likely that you may need to increase the dose on this medication for to be effective, however you will need to follow-up with your primary care doctor for this.  Please be cautious as this medicine can make you dizzy and sleepy.  Do not drink alcohol with this medication and take at night and do not drink or drive on this medication.  I have also prescribed a steroid pack for you to help with inflammation.  Please take this as directed.  Please return to the Emergency Department if you experience any worsening of your condition.  We encourage you to follow up with a primary care provider.  Thank you for allowing [place] to be a part of your care.

## 2022-05-13 NOTE — ED Notes (Signed)
Steven Li ambulated to ER Room 35 unassisted. Pt. Care assumed at this time. States he is having pain in the right arm down to fingers with cramping down to fingers going on right now. 2+ pitting edema to bilateral ankles noted with > 3 sec cap refill.

## 2022-05-13 NOTE — ED Provider Notes (Addendum)
[facility] EMERGENCY DEPARTMENT Provider Note   CSN: [id number] Arrival date & time: 05/13/22  0113     History  Chief Complaint  Patient presents with   Leg Swelling    Steven Li is a 56 y.o. male.  HPI 56 year old male with a history of EtOH abuse, hypercholesteremia, hypertensions, headaches, COPD presents to the ER with concerns for bilateral lower extremity edema, right more than left which has been ongoing for the last 3 days.  He denies any chest pain or shortness of breath.  Has also noticed radiating pain that goes from his right hip down the anterior aspect of his right leg.  He denies any numbness or tingling or foot drop.  He also notes radiating pain down his right shoulder as well.  Denies any headache, blurry vision, aphasia or any other neurologic deficits.  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  Denies any recent heavy lifting.  No recent injuries or falls.  No long road trips.  No history of PE or DVT.  He has not taken anything for his pain.  He states that he was seen here in the ER couple weeks ago (per chart review appears to be on 7/22 with acute alcohol gastritis) and was discharged on a medication which appears to be Nexium and Zofran.  He states he started to google side effects of these medications and thought that this may be causing his symptoms.  He reports he has an appointment with Dr. Alvis Lemmings at [facility] at the end of this month     Home Medications Prior to Admission medications   Medication Sig Start Date End Date Taking? Authorizing Provider  methylPREDNISolone (MEDROL DOSEPAK) 4 MG TBPK tablet Take as directed until finished 05/13/22  Yes Jazmine Longshore A, PA-C  albuterol (VENTOLIN HFA) 108 (90 Base) MCG/ACT inhaler Inhale 1-2 puffs into the lungs every 6 (six) hours as needed for wheezing or shortness of breath. 12/18/21   [name], MD  amLODipine (NORVASC) 10 MG tablet Take 1  tablet (10 mg total) by mouth daily. 11/27/21   [name], MD  atorvastatin (LIPITOR) 20 MG tablet Take 1 tablet (20 mg total) by mouth daily. 11/28/21   [name], MD  chlordiazePOXIDE (LIBRIUM) 25 MG capsule Take 2 capsules by mouth 3 times daily for 1 day, then 1 to 2 capsules twice daily for 1 day, then 1 to 2 capsules once daily for 1 day. 04/27/22   Charlynne Pander, MD  esomeprazole (NEXIUM) 40 MG capsule Take 1 capsule (40 mg total) by mouth daily. 04/27/22   Charlynne Pander, MD  gabapentin (NEURONTIN) 100 MG capsule Take 1 capsule (100 mg total) by mouth at bedtime. 05/13/22   Mare Ferrari, PA-C  ondansetron (ZOFRAN-ODT) 4 MG disintegrating tablet Take 1 tablet by mouth every 4 hours as needed for nausea/vomit 04/27/22   Charlynne Pander, MD      Allergies    Patient has no known allergies.    Review of Systems   Review of Systems Ten systems reviewed and are negative for acute change, except as noted in the HPI.   Physical Exam Updated Vital Signs BP 133/75 (BP Location: Right Arm)   Pulse 61   Temp 98.4 F (36.9 C) (Oral)   Resp 14   SpO2 98%  Physical Exam Vitals and nursing note reviewed.  Constitutional:  General: He is not in acute distress.    Appearance: He is well-developed.  HENT:     Head: Normocephalic and atraumatic.  Eyes:     Conjunctiva/sclera: Conjunctivae normal.  Cardiovascular:     Rate and Rhythm: Normal rate and regular rhythm.     Heart sounds: No murmur heard. Pulmonary:     Effort: Pulmonary effort is normal. No respiratory distress.     Breath sounds: Normal breath sounds.  Abdominal:     Palpations: Abdomen is soft.     Tenderness: There is no abdominal tenderness.  Musculoskeletal:        General: No swelling.     Cervical back: Neck supple.     Comments: No C, T, L-spine tenderness.  5/5 strength in upper and lower extremities.  No noticeable step-offs, crepitus, fluctuance, erythema.  Sensations intact.  Full range  of motion and strength of neck. Moving all 4 extremities without difficulty. 2+ DP pulses bilaterally.  Trace edema to bilateral lower extremities, right greater than left.  No overlying erythema, warmth, drainage, fluctuance.  Full range of motion with no pain with dorsiflexion and plantarflexion.  Able to move all 5 digits on both feet bilaterally.  No visible wounds.  Compartments are soft.     Skin:    General: Skin is warm and dry.     Capillary Refill: Capillary refill takes less than 2 seconds.  Neurological:     Mental Status: He is alert.     Comments: Mental Status:  Alert, thought content appropriate, able to give a coherent history. Speech fluent without evidence of aphasia. Able to follow 2 step commands without difficulty.  Cranial Nerves:  II:  Peripheral visual fields grossly normal, pupils equal, round, reactive to light III,IV, VI: ptosis not present, extra-ocular motions intact bilaterally  V,VII: smile symmetric, facial light touch sensation equal VIII: hearing grossly normal to voice  X: uvula elevates symmetrically  XI: bilateral shoulder shrug symmetric and strong XII: midline tongue extension without fassiculations Motor:  Normal tone. 5/5 strength of BUE and BLE major muscle groups including strong and equal grip strength and dorsiflexion/plantar flexion Sensory: light touch normal in all extremities. Cerebellar: normal finger-to-nose with bilateral upper extremities, Romberg sign absent Gait: normal gait and balance. Able to walk on toes and heels with ease.     Psychiatric:        Mood and Affect: Mood normal.     ED Results / Procedures / Treatments   Labs (all labs ordered are listed, but only abnormal results are displayed) Labs Reviewed  CBC WITH DIFFERENTIAL/PLATELET - Abnormal; Notable for the following components:      Result Value   RBC 3.19 (*)    Hemoglobin 10.3 (*)    HCT 31.4 (*)    Monocytes Absolute 1.4 (*)    Abs Immature Granulocytes  0.10 (*)    All other components within normal limits  BASIC METABOLIC PANEL - Abnormal; Notable for the following components:   Glucose, Bld 100 (*)    All other components within normal limits  URINALYSIS, ROUTINE W REFLEX MICROSCOPIC - Abnormal; Notable for the following components:   Color, Urine STRAW (*)    All other components within normal limits    EKG None  Radiology VAS [place] LOWER EXTREMITY VENOUS (DVT) (ONLY [facility] & [facility])  Result Date: 05/13/2022  Lower Venous DVT Study Patient Name:  Steven Li  Date of Exam:   05/13/2022 Medical Rec #: [id number]  Accession #:    [id number] Date of Birth: 1966-08-06      Patient Gender: M Patient Age:   72 years Exam Location:  [facility] Procedure:      VAS [place] LOWER EXTREMITY VENOUS (DVT) Referring Phys: Sho Salguero --------------------------------------------------------------------------------  Indications: Edema, and Swelling.  Comparison Study: no prior Performing Technologist: [place] Ponder RVS  Examination Guidelines: A complete evaluation includes B-mode imaging, spectral Doppler, color Doppler, and power Doppler as needed of all accessible portions of each vessel. Bilateral testing is considered an integral part of a complete examination. Limited examinations for reoccurring indications may be performed as noted. The reflux portion of the exam is performed with the patient in reverse Trendelenburg.  +---------+---------------+---------+-----------+----------+--------------+ RIGHT    CompressibilityPhasicitySpontaneityPropertiesThrombus Aging +---------+---------------+---------+-----------+----------+--------------+ CFV      Full           Yes      Yes                                 +---------+---------------+---------+-----------+----------+--------------+ SFJ      Full                                                        +---------+---------------+---------+-----------+----------+--------------+ FV Prox  Full                                                         +---------+---------------+---------+-----------+----------+--------------+ FV Mid   Full                                                        +---------+---------------+---------+-----------+----------+--------------+ FV DistalFull                                                        +---------+---------------+---------+-----------+----------+--------------+ PFV      Full                                                        +---------+---------------+---------+-----------+----------+--------------+ POP      Full           Yes      Yes                                 +---------+---------------+---------+-----------+----------+--------------+ PTV      Full                                                        +---------+---------------+---------+-----------+----------+--------------+  PERO     Full                                                        +---------+---------------+---------+-----------+----------+--------------+   +---------+---------------+---------+-----------+----------+--------------+ LEFT     CompressibilityPhasicitySpontaneityPropertiesThrombus Aging +---------+---------------+---------+-----------+----------+--------------+ CFV      Full           Yes      Yes                                 +---------+---------------+---------+-----------+----------+--------------+ SFJ      Full                                                        +---------+---------------+---------+-----------+----------+--------------+ FV Prox  Full                                                        +---------+---------------+---------+-----------+----------+--------------+ FV Mid   Full                                                        +---------+---------------+---------+-----------+----------+--------------+ FV DistalFull                                                         +---------+---------------+---------+-----------+----------+--------------+ PFV      Full                                                        +---------+---------------+---------+-----------+----------+--------------+ POP      Full           Yes      Yes                                 +---------+---------------+---------+-----------+----------+--------------+ PTV      Full                                                        +---------+---------------+---------+-----------+----------+--------------+ PERO     Full                                                        +---------+---------------+---------+-----------+----------+--------------+  Summary: BILATERAL: - No evidence of deep vein thrombosis seen in the lower extremities, bilaterally. -No evidence of popliteal cyst, bilaterally.   *See table(s) above for measurements and observations.    Preliminary    DG Shoulder Right  Result Date: 05/13/2022 CLINICAL DATA:  Right shoulder pain starting 3 days ago at work. EXAM: RIGHT SHOULDER - 2+ VIEW COMPARISON:  None Available. FINDINGS: There is no evidence of fracture or dislocation. There is no evidence of arthropathy or other focal bone abnormality. Soft tissues are unremarkable. IMPRESSION: Negative. Electronically Signed   By: Sherian Rein M.D.   On: 05/13/2022 08:47   DG Ankle Complete Left  Result Date: 05/13/2022 CLINICAL DATA:  Left ankle injury, left ankle pain EXAM: LEFT ANKLE COMPLETE - 3+ VIEW COMPARISON:  None Available. FINDINGS: There is no evidence of fracture, dislocation, or joint effusion. Tiny plantar calcaneal spur. There is no evidence of arthropathy or other focal bone abnormality. Soft tissues are unremarkable. IMPRESSION: Negative. Electronically Signed   By: Helyn Numbers M.D.   On: 05/13/2022 02:02   DG Tibia/Fibula Right  Result Date: 05/13/2022 CLINICAL DATA:  Right leg injury, right leg pain EXAM: RIGHT TIBIA AND FIBULA - 2 VIEW  COMPARISON:  None Available. FINDINGS: There is no evidence of fracture or other focal bone lesions. Soft tissues are unremarkable. IMPRESSION: Negative. Electronically Signed   By: Helyn Numbers M.D.   On: 05/13/2022 02:01   DG Femur Min 2 Views Right  Result Date: 05/13/2022 CLINICAL DATA:  Right leg injury, pain EXAM: RIGHT FEMUR 2 VIEWS COMPARISON:  None Available. FINDINGS: There is no evidence of fracture or other focal bone lesions. Vascular calcifications noted posterior to the right knee. IMPRESSION: Negative. Electronically Signed   By: Helyn Numbers M.D.   On: 05/13/2022 02:01   DG Ankle Complete Right  Result Date: 05/13/2022 CLINICAL DATA:  Right leg injury, pain EXAM: RIGHT ANKLE - COMPLETE 3+ VIEW COMPARISON:  None Available. FINDINGS: There is no evidence of fracture, dislocation, or joint effusion. There is no evidence of arthropathy or other focal bone abnormality. Soft tissues are unremarkable. IMPRESSION: Negative. Electronically Signed   By: Helyn Numbers M.D.   On: 05/13/2022 02:00    Procedures Procedures    Medications Ordered in ED Medications  ketorolac (TORADOL) injection 60 mg (60 mg Intramuscular Given 05/13/22 0817)    ED Course/ Medical Decision Making/ A&P                           Medical Decision Making Amount and/or Complexity of Data Reviewed Labs: ordered. Radiology: ordered.  Risk Prescription drug management.   This patient presents to the ED for concern of bilateral lower extremity edema, this involves a number of treatment options, and is a complaint that carries with it a high risk of complications and morbidity.  The differential diagnosis includes DVT, CHF, physiologic swelling, fractures, stroke, radiculopathy   Co morbidities: Discussed in HPI   Brief History:  56 year old male presenting with bilateral lower extremity edema and radiating pain down his right leg and right arm.  This has been ongoing for about 3 days.  EMR reviewed  including pt PMHx, past surgical history and past visits to ER.   See HPI for more details   Lab Tests:  I ordered and independently interpreted labs.  The pertinent results include:    Labs notable for CBC with a two-point hemoglobin drop from 2 weeks ago, however  patient denying any acute bleeding, no hematochezia, no hematemesis.  Suspect anemia in the setting of chronic alcohol use.  BMP without any significant electrode abnormalities, normal renal function.  UA unremarkable.  Imaging Studies:  NAD. I personally reviewed all imaging studies and no acute abnormality found. I agree with radiology interpretation. X-rays of the right ankle, femur, tib-fib and left ankle obtained in triage, which I personally reviewed, negative for acute abnormalities.   Cardiac Monitoring: Not indicated  Medicines ordered:  I ordered medication including Toradol for pain Reevaluation of the patient after these medicines showed that the patient improved I have reviewed the patients home medicines and have made adjustments as needed   Critical Interventions:  NA  Consults:  NA   Reevaluation:  After the interventions noted above I re-evaluated patient and found that they have :improved   Social Determinants of Health:  Uninsured, followed by [employer] community health and wellness, chronic alcohol abuse    Problem List / ED Course:  56 year old male presenting bilateral lower extremity edema and radiating pain down his right leg and right arm.  Overall, his presentation is reassuring.  He is well-appearing and in no acute distress.  Vitals are reassuring.  His physical exam is largely unremarkable, he does have trace edema to lower extremities, right greater than left.  He has intact strength in upper and lower extremities.  His sensations are intact.  I have low suspicion for acute stroke.  He does not have evidence of acute compartment syndrome, however given slightly varying trace edema  to lower extremities, will obtain DVT study.  No evidence of CHF exacerbation. Echo reviewed, EF of 50% in 2017. I reviewed his x-rays obtained in triage and these are overall negative.  We will also obtain a shoulder x-ray.  I suspect that his symptoms are secondary to radiculopathy, patient does report a history of taking gabapentin though does not recall exactly for what.  I suspect physiologic swelling secondary to prolonged periods of standing at work but will rule out DVT.  I personally reviewed his imaging, agree with radiology read.  DVT study is negative.  Right shoulder x-ray is negative.  Again given reassuring work-up, I suspect that his symptoms are due to radicular pain the swelling is likely physiologic.  I recommended compression stockings.  We will provide low-dose gabapentin prescription of 100 mg daily, patient educated on avoiding alcohol with this medication, taking at night and no drinking or driving on this medication secondary to sedating side effects. Pt reports he has been sober and has not smoked cigarettes in 30 days.  I did inform the patient that he may need to increase the dosing of the Gabapentin however will need to follow-up with his PCP for this. If swelling persists he may need a f/u echo. Will also prescribe a Medrol Dosepak for radiculopathy.  No history of DM.    Dispostion:  After consideration of the diagnostic results and the patients response to treatment, I feel that the patent would benefit from discharge with close PCP follow-up and strict return precautions.  Patient voiced understanding and is agreeable.  Stable for discharge.   Final Clinical Impression(s) / ED Diagnoses Final diagnoses:  Peripheral edema  Radicular pain    Rx / DC Orders ED Discharge Orders          Ordered    gabapentin (NEURONTIN) 100 MG capsule  Daily at bedtime,   Status:  Discontinued        05/13/22 0900  methylPREDNISolone (MEDROL DOSEPAK) 4 MG TBPK tablet         05/13/22 0902    gabapentin (NEURONTIN) 100 MG capsule  Daily at bedtime        05/13/22 0902              Mare Ferrari, PA-C 05/13/22 0915    Gloris Manchester, MD 05/13/22 1902

## 2022-05-13 NOTE — ED Notes (Signed)
Steven Li ambulated to the lobby unassisted awaiting ride home

## 2022-05-13 NOTE — Progress Notes (Signed)
Lower extremity venous duplex has been completed.   Preliminary results in CV Proc.   Jinny Blossom Cree Kunert 05/13/2022 8:54 AM

## 2022-05-13 NOTE — ED Notes (Signed)
Return to room from [employer].

## 2022-05-20 ENCOUNTER — Other Ambulatory Visit: Payer: Self-pay

## 2022-05-21 ENCOUNTER — Other Ambulatory Visit: Payer: Self-pay

## 2022-05-27 ENCOUNTER — Other Ambulatory Visit: Payer: Self-pay

## 2022-05-27 ENCOUNTER — Ambulatory Visit: Payer: Self-pay | Attending: Family Medicine | Admitting: Family Medicine

## 2022-05-27 ENCOUNTER — Encounter: Payer: Self-pay | Admitting: Family Medicine

## 2022-05-27 ENCOUNTER — Telehealth: Payer: Self-pay | Admitting: Physical Medicine and Rehabilitation

## 2022-05-27 VITALS — BP 134/86 | HR 65 | Temp 98.4°F | Resp 16 | Ht 68.0 in | Wt 130.0 lb

## 2022-05-27 DIAGNOSIS — M79601 Pain in right arm: Secondary | ICD-10-CM

## 2022-05-27 DIAGNOSIS — G621 Alcoholic polyneuropathy: Secondary | ICD-10-CM

## 2022-05-27 DIAGNOSIS — I10 Essential (primary) hypertension: Secondary | ICD-10-CM

## 2022-05-27 DIAGNOSIS — Z1211 Encounter for screening for malignant neoplasm of colon: Secondary | ICD-10-CM

## 2022-05-27 DIAGNOSIS — E7800 Pure hypercholesterolemia, unspecified: Secondary | ICD-10-CM

## 2022-05-27 DIAGNOSIS — M7711 Lateral epicondylitis, right elbow: Secondary | ICD-10-CM

## 2022-05-27 DIAGNOSIS — M7712 Lateral epicondylitis, left elbow: Secondary | ICD-10-CM

## 2022-05-27 DIAGNOSIS — M79602 Pain in left arm: Secondary | ICD-10-CM

## 2022-05-27 MED ORDER — AMLODIPINE BESYLATE 10 MG PO TABS
10.0000 mg | ORAL_TABLET | Freq: Every day | ORAL | 1 refills | Status: DC
  Filled 2022-05-27 – 2022-06-17 (×2): qty 90, 90d supply, fill #0
  Filled 2022-09-23: qty 90, 90d supply, fill #1

## 2022-05-27 MED ORDER — DULOXETINE HCL 60 MG PO CPEP: 60.0000 mg | ORAL_CAPSULE | Freq: Every day | ORAL | 3 refills | Status: DC

## 2022-05-27 MED ORDER — AMLODIPINE BESYLATE 10 MG PO TABS: 10.0000 mg | ORAL_TABLET | Freq: Every day | ORAL | 1 refills | Status: DC

## 2022-05-27 MED ORDER — GABAPENTIN 300 MG PO CAPS: 300.0000 mg | ORAL_CAPSULE | Freq: Every day | ORAL | 1 refills | Status: DC

## 2022-05-27 MED ORDER — ATORVASTATIN CALCIUM 20 MG PO TABS: 20.0000 mg | ORAL_TABLET | Freq: Every day | ORAL | 1 refills | Status: DC

## 2022-05-27 MED ORDER — ATORVASTATIN CALCIUM 20 MG PO TABS
20.0000 mg | ORAL_TABLET | Freq: Every day | ORAL | 1 refills | Status: DC
  Filled 2022-05-27 – 2022-06-17 (×3): qty 90, 90d supply, fill #0

## 2022-05-27 MED ORDER — GABAPENTIN 300 MG PO CAPS
300.0000 mg | ORAL_CAPSULE | Freq: Every day | ORAL | 1 refills | Status: DC
  Filled 2022-05-27: qty 90, 90d supply, fill #0

## 2022-05-27 MED ORDER — DULOXETINE HCL 60 MG PO CPEP
60.0000 mg | ORAL_CAPSULE | Freq: Every day | ORAL | 3 refills | Status: DC
  Filled 2022-05-27: qty 30, 30d supply, fill #0
  Filled 2022-06-20: qty 30, 30d supply, fill #1

## 2022-05-27 NOTE — Progress Notes (Signed)
Subjective:  Patient ID: Steven Li, male    DOB: Mar 04, 1966  Age: 56 y.o. MRN: [id number]  CC: Arm Pain (Constant pain b/l arms and hands, doesn't feel gabapentin is working, has tried tylenol arthritis w/ no relief/)   HPI Steven Li is a 56 y.o. year old male with a history of  hypertension, tobacco abuse (greater than 20-pack-year), alcohol abuse here for chronic disease management.  Interval History: At his last visit, 6 months ago he had complained of left arm pain from his elbow down to his hand with associated numbness and was placed on gabapentin but states this has been ineffective.  Labs revealed a normal vitamin B12, negative for diabetes mellitus and symptoms were thought to be due to alcoholic peripheral neuropathy. Today he states pain starts from bilateral shoulder down to his hands, pain is sharp , lasts 30 seconds then resolves and returns 20 minutes later. 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 He is right hand dominant. Has been using Tylenol arthritis which has been ineffective.  He also has pain in both elbows.  He has been without alcohol or Cigarettes and is using nicotine gum.  He had an ED visit 2 weeks ago for pedal edema and symptoms have resolved after he use compression stockings. Past Medical History:  Diagnosis Date   COPD (chronic obstructive pulmonary disease) ([place])    ETOH abuse    Headache    "a few/year" (04/17/2018)   High cholesterol    Hypertension    Migraine    "a few/year" (04/17/2018)    Past Surgical History:  Procedure Laterality Date   NO PAST SURGERIES      Family History  Problem Relation Age of Onset   Cirrhosis Mother        alcoholic   Stomach cancer Mother    Cirrhosis Father        alcoholic    Diabetes Sister     Social History   Socioeconomic History   Marital status: Divorced    Spouse name: Not on file   Number of children: Not on file   Years of  education: Not on file   Highest education level: Not on file  Occupational History   Not on file  Tobacco Use   Smoking status: Every Day    Packs/day: 0.50    Years: 33.00    Total pack years: 16.50    Types: Cigarettes   Smokeless tobacco: Never  Vaping Use   Vaping Use: Never used  Substance and Sexual Activity   Alcohol use: Yes    Alcohol/week: 42.0 standard drinks of alcohol    Types: 42 Cans of beer per week    Comment: 04/27/2022 6 pack a day   Drug use: Not Currently    Types: Marijuana    Comment: 04/17/2018 "qd"   Sexual activity: Not Currently  Other Topics Concern   Not on file  Social History Narrative   Not on file   Social Determinants of Health   Financial Resource Strain: Not on file  Food Insecurity: Not on file  Transportation Needs: Not on file  Physical Activity: Not on file  Stress: Not on file  Social Connections: Not on file    No Known Allergies  Outpatient Medications Prior to Visit  Medication Sig Dispense Refill   albuterol (VENTOLIN HFA) 108 (90 Base) MCG/ACT inhaler Inhale 1-2 puffs into the  lungs every 6 (six) hours as needed for wheezing or shortness of breath. 18 g 3   amLODipine (NORVASC) 10 MG tablet Take 1 tablet (10 mg total) by mouth daily. 90 tablet 1   atorvastatin (LIPITOR) 20 MG tablet Take 1 tablet (20 mg total) by mouth daily. 30 tablet 6   gabapentin (NEURONTIN) 100 MG capsule Take 1 capsule (100 mg total) by mouth at bedtime. 20 capsule 0   chlordiazePOXIDE (LIBRIUM) 25 MG capsule Take 2 capsules by mouth 3 times daily for 1 day, then 1 to 2 capsules twice daily for 1 day, then 1 to 2 capsules once daily for 1 day. (Patient not taking: Reported on 05/27/2022) 10 capsule 0   esomeprazole (NEXIUM) 40 MG capsule Take 1 capsule (40 mg total) by mouth daily. (Patient not taking: Reported on 05/27/2022) 30 capsule 0   methylPREDNISolone (MEDROL DOSEPAK) 4 MG TBPK tablet Take as directed until finished (Patient not taking: Reported  on 05/27/2022) 21 each 0   ondansetron (ZOFRAN-ODT) 4 MG disintegrating tablet Take 1 tablet by mouth every 4 hours as needed for nausea/vomit (Patient not taking: Reported on 05/27/2022) 10 tablet 0   No facility-administered medications prior to visit.     ROS Review of Systems  Constitutional:  Negative for activity change and appetite change.  HENT:  Negative for sinus pressure and sore throat.   Respiratory:  Negative for chest tightness, shortness of breath and wheezing.   Cardiovascular:  Negative for chest pain and palpitations.  Gastrointestinal:  Negative for abdominal distention, abdominal pain and constipation.  Genitourinary: Negative.   Musculoskeletal:        See HPI  Psychiatric/Behavioral:  Negative for behavioral problems and dysphoric mood.     Objective:  BP 134/86 (BP Location: Left Arm, Patient Position: Sitting, Cuff Size: Normal)   Pulse 65   Temp 98.4 F (36.9 C)   Resp 16   Ht '5\' 8"'$  (1.727 m)   Wt 130 lb (59 kg)   SpO2 99%   BMI 19.77 kg/m      05/27/2022    9:23 AM 05/13/2022    9:54 AM 05/13/2022    6:16 AM  BP/Weight  Systolic BP 676 195 093  Diastolic BP 86 70 75  Wt. (Lbs) 130    BMI 19.77 kg/m2        Physical Exam Constitutional:      Appearance: He is well-developed.  Cardiovascular:     Rate and Rhythm: Normal rate.     Heart sounds: Normal heart sounds. No murmur heard. Pulmonary:     Effort: Pulmonary effort is normal.     Breath sounds: Normal breath sounds. No wheezing or rales.  Chest:     Chest wall: No tenderness.  Abdominal:     General: Bowel sounds are normal. There is no distension.     Palpations: Abdomen is soft. There is no mass.     Tenderness: There is no abdominal tenderness.  Musculoskeletal:        General: Normal range of motion.     Right lower leg: No edema.     Left lower leg: No edema.  Neurological:     Mental Status: He is alert and oriented to person, place, and time.  Psychiatric:        Mood  and Affect: Mood normal.        Latest Ref Rng & Units 05/13/2022    3:21 AM 04/27/2022    9:00 PM 04/27/2022  8:57 PM  CMP  Glucose 70 - 99 mg/dL 100  90  89   BUN 6 - 20 mg/dL '14  11  10   '$ Creatinine 0.61 - 1.24 mg/dL 1.06  0.70  0.81   Sodium 135 - 145 mmol/L 139  134  137   Potassium 3.5 - 5.1 mmol/L 4.3  4.1  4.2   Chloride 98 - 111 mmol/L 105  105  102   CO2 22 - 32 mmol/L 24   23   Calcium 8.9 - 10.3 mg/dL 9.5   9.2   Total Protein 6.5 - 8.1 g/dL   7.3   Total Bilirubin 0.3 - 1.2 mg/dL   0.7   Alkaline Phos 38 - 126 U/L   86   AST 15 - 41 U/L   72   ALT 0 - 44 U/L   32     Lipid Panel     Component Value Date/Time   CHOL 239 (H) 11/27/2021 0921   TRIG 104 11/27/2021 0921   HDL 136 11/27/2021 0921   CHOLHDL 1.8 05/15/2020 0932   LDLCALC 86 11/27/2021 0921    CBC    Component Value Date/Time   WBC 7.5 05/13/2022 0321   RBC 3.19 (L) 05/13/2022 0321   HGB 10.3 (L) 05/13/2022 0321   HGB 12.8 (L) 02/01/2019 0921   HCT 31.4 (L) 05/13/2022 0321   HCT 36.6 (L) 02/01/2019 0921   PLT 355 05/13/2022 0321   PLT 321 02/01/2019 0921   MCV 98.4 05/13/2022 0321   MCV 88 02/01/2019 0921   MCH 32.3 05/13/2022 0321   MCHC 32.8 05/13/2022 0321   RDW 12.6 05/13/2022 0321   RDW 11.8 02/01/2019 0921   LYMPHSABS 1.8 05/13/2022 0321   MONOABS 1.4 (H) 05/13/2022 0321   EOSABS 0.3 05/13/2022 0321   BASOSABS 0.1 05/13/2022 0321    Lab Results  Component Value Date   HGBA1C 5.2 11/27/2021    Assessment & Plan:  1. Essential hypertension Controlled Counseled on blood pressure goal of less than 130/80, low-sodium, DASH diet, medication compliance, 150 minutes of moderate intensity exercise per week. Discussed medication compliance, adverse effects. - amLODipine (NORVASC) 10 MG tablet; Take 1 tablet (10 mg total) by mouth daily.  Dispense: 90 tablet; Refill: 1  2. Alcoholic peripheral neuropathy (HCC) Commended on quitting alcohol Increase gabapentin dose - gabapentin  (NEURONTIN) 300 MG capsule; Take 1 capsule (300 mg total) by mouth at bedtime.  Dispense: 90 capsule; Refill: 1  3. Bilateral arm pain He does have some form of neuropathy, possible cervical radiculopathy but interestingly has no neck pain Right shoulder x-ray from the ED was negative Might benefit from nerve conduction studies - AMB referral to orthopedics - DULoxetine (CYMBALTA) 60 MG capsule; Take 1 capsule (60 mg total) by mouth daily. For pain  Dispense: 30 capsule; Refill: 3  4. Lateral epicondylitis of both elbows He will need cortisone injection - AMB referral to orthopedics  5. Screening for colon cancer - Ambulatory referral to Gastroenterology  6. Pure hypercholesterolemia Last total cholesterol was elevated in 11/2021 Continue statin Lipid panel at next visit Low-cholesterol diet - atorvastatin (LIPITOR) 20 MG tablet; Take 1 tablet (20 mg total) by mouth daily.  Dispense: 90 tablet; Refill: 1    Meds ordered this encounter  Medications   DISCONTD: DULoxetine (CYMBALTA) 60 MG capsule    Sig: Take 1 capsule (60 mg total) by mouth daily. For pain    Dispense:  30 capsule  Refill:  3   DISCONTD: amLODipine (NORVASC) 10 MG tablet    Sig: Take 1 tablet (10 mg total) by mouth daily.    Dispense:  90 tablet    Refill:  1   DISCONTD: atorvastatin (LIPITOR) 20 MG tablet    Sig: Take 1 tablet (20 mg total) by mouth daily.    Dispense:  90 tablet    Refill:  1   DISCONTD: gabapentin (NEURONTIN) 300 MG capsule    Sig: Take 1 capsule (300 mg total) by mouth at bedtime.    Dispense:  90 capsule    Refill:  1   amLODipine (NORVASC) 10 MG tablet    Sig: Take 1 tablet (10 mg total) by mouth daily.    Dispense:  90 tablet    Refill:  1   atorvastatin (LIPITOR) 20 MG tablet    Sig: Take 1 tablet (20 mg total) by mouth daily.    Dispense:  90 tablet    Refill:  1   DULoxetine (CYMBALTA) 60 MG capsule    Sig: Take 1 capsule (60 mg total) by mouth daily. For pain     Dispense:  30 capsule    Refill:  3   gabapentin (NEURONTIN) 300 MG capsule    Sig: Take 1 capsule (300 mg total) by mouth at bedtime.    Dispense:  90 capsule    Refill:  1    Follow-up: Return in about 6 months (around 11/27/2022), or if symptoms worsen or fail to improve.       Charlott Rakes, MD, FAAFP. [facility] and [place], [place]   05/27/2022, 10:35 AM

## 2022-05-27 NOTE — Telephone Encounter (Signed)
Error

## 2022-05-27 NOTE — Patient Instructions (Signed)
Tennis Elbow  Tennis elbow is irritation and swelling (inflammation) in your outer forearm, near your elbow. Swelling affects the tissues that connect muscle to bone (tendons). Tennis elbow can happen playing any sport or doing any job where you use your elbow too much. It is caused by doing the same motion over and over. What are the causes? This condition is often caused by playing sports or doing work where you need to keep moving your forearm the same way. Sometimes, it may be caused by a sudden injury. What increases the risk? You are more likely to get tennis elbow if you play tennis or another racket sport. You also have a higher risk if you often use your hands for work. This includes: People who use computers. Construction workers. People who work in a factory. Musicians. Cooks. Cashiers. What are the signs or symptoms? Pain and tenderness in your forearm and the outer part of your elbow. You may have pain all the time or only when you use your arm. A burning feeling. This starts in your elbow and spreads down your arm. A weak grip in your hand. How is this treated? Resting and icing your arm is often the first treatment. Your doctor may also recommend: Medicines to reduce pain and swelling. An elbow strap. Physical therapy. This may include massage or exercises or both. An elbow brace. If these do not help your symptoms get better, your doctor may recommend surgery. Follow these instructions at home: If you have a brace or strap: Wear the brace or strap as told by your doctor. Take it off only as told by your doctor. Check the skin around the brace or strap every day. Tell your doctor if you see problems. Loosen it if your fingers: Tingle. Become numb. Turn cold and blue. Keep the brace or strap clean. If the brace or strap is not waterproof: Do not let it get wet. Cover it with a watertight covering when you take a bath or a shower. Managing pain, stiffness, and  swelling  If told, put ice on the injured area. To do this: If you have a removable brace or strap, take it off as told by your doctor. Put ice in a plastic bag. Place a towel between your skin and the bag. Leave the ice on for 20 minutes, 2-3 times a day. Take off the ice if your skin turns bright red. This is very important. If you cannot feel pain, heat, or cold, you have a greater risk of damage to the area. Move your fingers often. Activity Rest your elbow and wrist. Avoid activities that can cause elbow problems as told by your doctor. Do exercises as told by your doctor. If you lift an object, lift it with your palm facing up. Lifestyle If your tennis elbow is caused by sports, check your equipment and make sure that: You are using it the right way. It fits you well. If your tennis elbow is caused by work or computer use, take breaks often to stretch your arm. Talk with your manager about how you can make your condition better at work. General instructions Take over-the-counter and prescription medicines only as told by your doctor. Do not smoke or use any products that contain nicotine or tobacco. If you need help quitting, ask your doctor. Keep all follow-up visits. How is this prevented? Before and after being active: Warm up and stretch before being active. Cool down and stretch after being active. Give your body time to   rest between activities. While being active: Make sure to use equipment that fits you. If you play tennis, put power in your stroke with your lower body. Avoid using your arm only. Maintain physical fitness. This includes: Strength. Flexibility. Endurance. Do exercises to strengthen the forearm muscles. Contact a doctor if: Your pain does not get better with treatment. Your pain gets worse. You have weakness in your forearm, hand, or fingers. You cannot feel your forearm, hand, or fingers. Get help right away if: Your pain is very bad. You cannot  move your wrist. Summary Tennis elbow is irritation and swelling (inflammation) in your outer forearm, near your elbow. Tennis elbow is caused by doing the same motion over and over. Rest your elbow and wrist. Avoid activities as told by your doctor. If told, put ice on the injured area for 20 minutes, 2-3 times a day. This information is not intended to replace advice given to you by your health care provider. Make sure you discuss any questions you have with your health care provider. Document Revised: 04/04/2020 Document Reviewed: 04/04/2020 Elsevier Patient Education  2023 [employer]  

## 2022-06-11 ENCOUNTER — Ambulatory Visit: Payer: Self-pay | Admitting: Sports Medicine

## 2022-06-17 ENCOUNTER — Other Ambulatory Visit: Payer: Self-pay

## 2022-06-18 ENCOUNTER — Other Ambulatory Visit: Payer: Self-pay

## 2022-06-19 ENCOUNTER — Other Ambulatory Visit: Payer: Self-pay

## 2022-06-20 ENCOUNTER — Other Ambulatory Visit: Payer: Self-pay

## 2022-06-21 ENCOUNTER — Other Ambulatory Visit: Payer: Self-pay

## 2022-09-24 ENCOUNTER — Other Ambulatory Visit: Payer: Self-pay

## 2022-09-25 ENCOUNTER — Other Ambulatory Visit: Payer: Self-pay

## 2022-10-22 ENCOUNTER — Other Ambulatory Visit: Payer: Self-pay

## 2022-10-22 ENCOUNTER — Other Ambulatory Visit: Payer: Self-pay | Admitting: Family Medicine

## 2022-10-22 DIAGNOSIS — I10 Essential (primary) hypertension: Secondary | ICD-10-CM

## 2022-10-22 NOTE — Telephone Encounter (Signed)
Last reordered 05/27/22 #90 1 RF  Requested Prescriptions  Refused Prescriptions Disp Refills   amLODipine (NORVASC) 10 MG tablet 90 tablet 1    Sig: Take 1 tablet (10 mg total) by mouth daily.     Cardiovascular: Calcium Channel Blockers 2 Passed - 10/22/2022  9:03 AM      Passed - Last BP in normal range    BP Readings from Last 1 Encounters:  05/27/22 134/86         Passed - Last Heart Rate in normal range    Pulse Readings from Last 1 Encounters:  05/27/22 65         Passed - Valid encounter within last 6 months    Recent Outpatient Visits           4 months ago Bilateral arm pain   [place], Charlane Ferretti, MD   10 months ago Screening for colon cancer   [place], Enobong, MD   11 months ago Essential hypertension   [place], RPH-CPP   1 year ago Screening for colon cancer   [place], MD   2 years ago Alcohol abuse   [place], MD       Future Appointments             In 1 month Charlott Rakes, MD [place]

## 2022-10-23 ENCOUNTER — Other Ambulatory Visit: Payer: Self-pay

## 2022-11-09 IMAGING — DX DG CHEST 1V PORT
1 series · 1 of 1 positions shown · non-contrast
Comparison: 01/06/2022

CLINICAL DATA: Shortness of breath, nausea, and body aches for 3
days.

EXAM:
PORTABLE CHEST 1 VIEW

[chest ap]
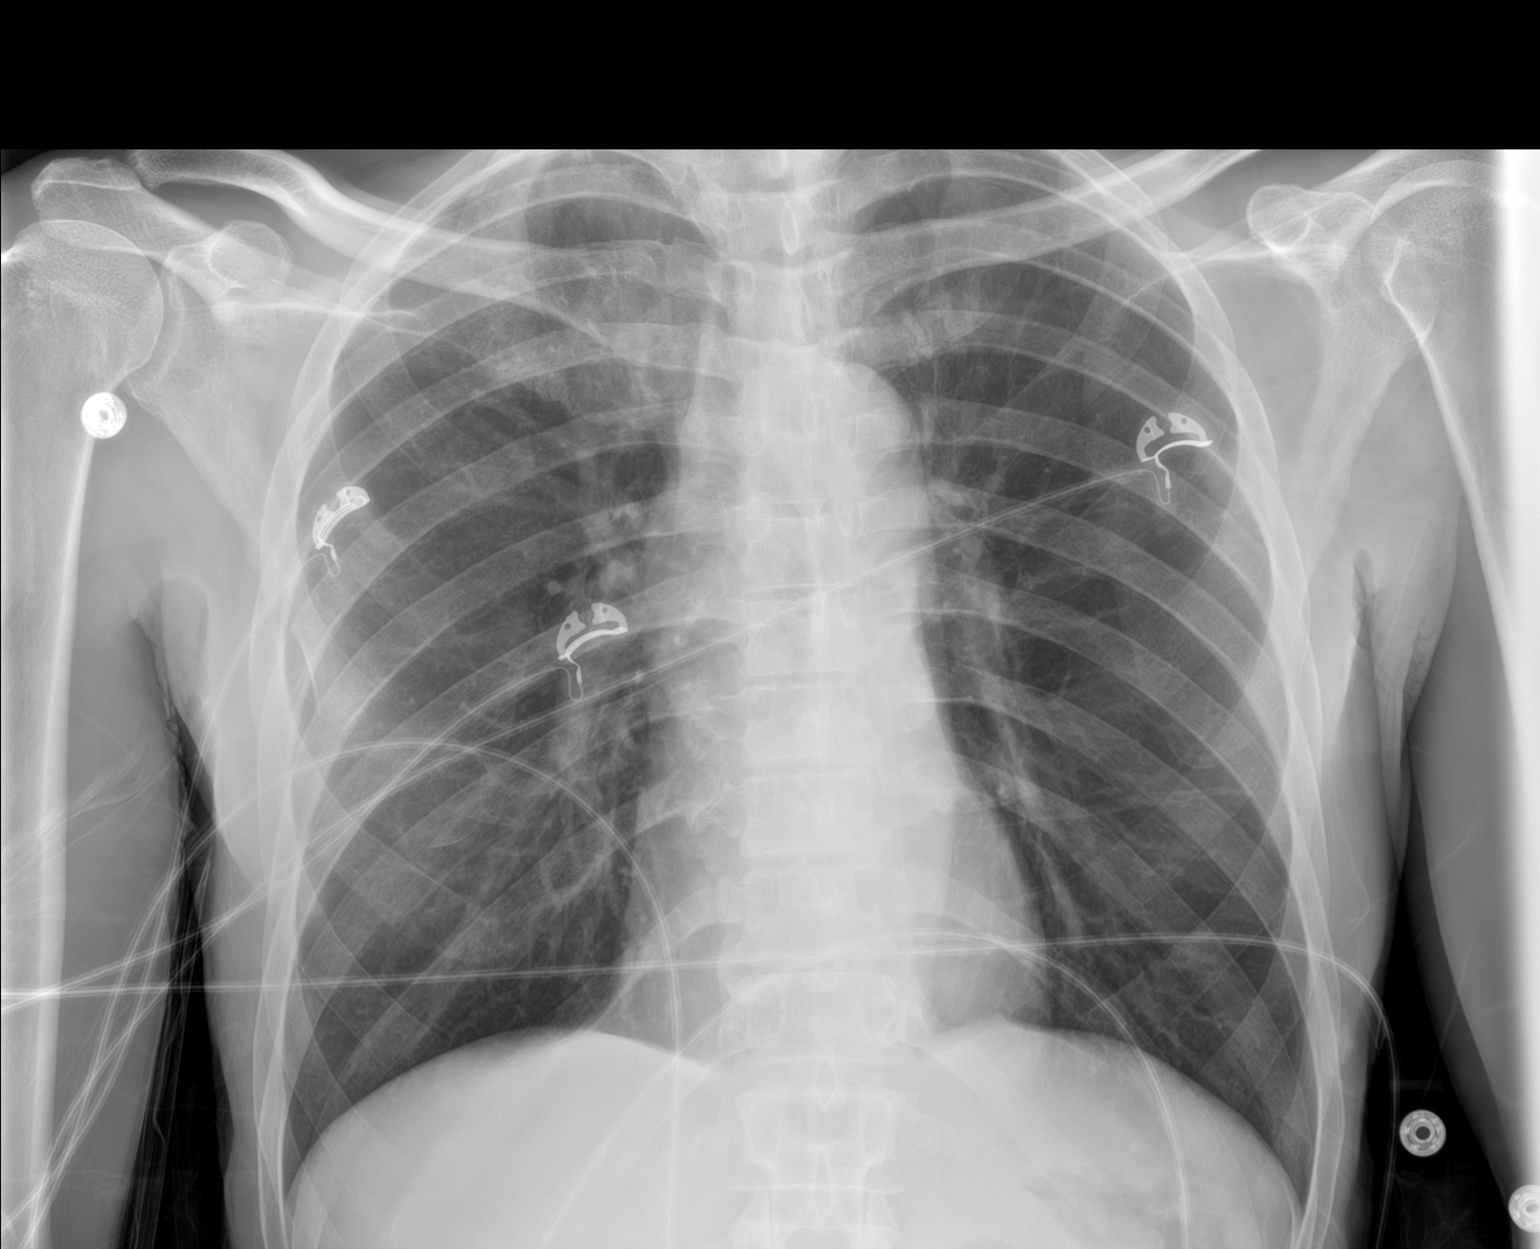

[1 of 1 positions shown; findings below may reference images not displayed]

FINDINGS: The heart size and mediastinal contours are within normal limits.
Both lungs are clear. The visualized skeletal structures are
unremarkable.
IMPRESSION: No active disease.

## 2022-11-12 ENCOUNTER — Other Ambulatory Visit: Payer: Self-pay | Admitting: Family Medicine

## 2022-11-12 ENCOUNTER — Other Ambulatory Visit: Payer: Self-pay

## 2022-11-12 DIAGNOSIS — I10 Essential (primary) hypertension: Secondary | ICD-10-CM

## 2022-11-13 ENCOUNTER — Other Ambulatory Visit: Payer: Self-pay

## 2022-11-13 MED ORDER — AMLODIPINE BESYLATE 10 MG PO TABS
10.0000 mg | ORAL_TABLET | Freq: Every day | ORAL | 0 refills | Status: DC
  Filled 2022-11-13: qty 90, 90d supply, fill #0

## 2022-11-13 NOTE — Telephone Encounter (Signed)
Requested Prescriptions  Pending Prescriptions Disp Refills   amLODipine (NORVASC) 10 MG tablet 90 tablet 0    Sig: Take 1 tablet (10 mg total) by mouth daily.     Cardiovascular: Calcium Channel Blockers 2 Passed - 11/12/2022 10:41 AM      Passed - Last BP in normal range    BP Readings from Last 1 Encounters:  05/27/22 134/86         Passed - Last Heart Rate in normal range    Pulse Readings from Last 1 Encounters:  05/27/22 65         Passed - Valid encounter within last 6 months    Recent Outpatient Visits           5 months ago Bilateral arm pain   [place], Charlane Ferretti, MD   11 months ago Screening for colon cancer   [place], Enobong, MD   12 months ago Essential hypertension   [place], RPH-CPP   1 year ago Screening for colon cancer   [place], Enobong, MD   2 years ago Alcohol abuse   [place], Enobong, MD       Future Appointments             In 2 weeks Charlott Rakes, MD [place]

## 2022-11-27 ENCOUNTER — Encounter: Payer: Self-pay | Admitting: Gastroenterology

## 2022-11-27 ENCOUNTER — Ambulatory Visit: Payer: Medicaid Other | Attending: Family Medicine | Admitting: Family Medicine

## 2022-11-27 ENCOUNTER — Encounter: Payer: Self-pay | Admitting: Family Medicine

## 2022-11-27 ENCOUNTER — Other Ambulatory Visit: Payer: Self-pay | Admitting: Family Medicine

## 2022-11-27 ENCOUNTER — Other Ambulatory Visit: Payer: Self-pay

## 2022-11-27 VITALS — BP 182/82 | HR 75 | Temp 98.2°F | Ht 69.0 in | Wt 120.8 lb

## 2022-11-27 DIAGNOSIS — R001 Bradycardia, unspecified: Secondary | ICD-10-CM | POA: Insufficient documentation

## 2022-11-27 DIAGNOSIS — M79602 Pain in left arm: Secondary | ICD-10-CM | POA: Diagnosis not present

## 2022-11-27 DIAGNOSIS — F17210 Nicotine dependence, cigarettes, uncomplicated: Secondary | ICD-10-CM

## 2022-11-27 DIAGNOSIS — Z79899 Other long term (current) drug therapy: Secondary | ICD-10-CM | POA: Diagnosis not present

## 2022-11-27 DIAGNOSIS — Z91148 Patient's other noncompliance with medication regimen for other reason: Secondary | ICD-10-CM | POA: Insufficient documentation

## 2022-11-27 DIAGNOSIS — I10 Essential (primary) hypertension: Secondary | ICD-10-CM

## 2022-11-27 DIAGNOSIS — M79601 Pain in right arm: Secondary | ICD-10-CM

## 2022-11-27 DIAGNOSIS — R079 Chest pain, unspecified: Secondary | ICD-10-CM | POA: Diagnosis not present

## 2022-11-27 DIAGNOSIS — Z1211 Encounter for screening for malignant neoplasm of colon: Secondary | ICD-10-CM

## 2022-11-27 DIAGNOSIS — J449 Chronic obstructive pulmonary disease, unspecified: Secondary | ICD-10-CM | POA: Insufficient documentation

## 2022-11-27 DIAGNOSIS — G621 Alcoholic polyneuropathy: Secondary | ICD-10-CM

## 2022-11-27 DIAGNOSIS — E7800 Pure hypercholesterolemia, unspecified: Secondary | ICD-10-CM | POA: Diagnosis not present

## 2022-11-27 DIAGNOSIS — I259 Chronic ischemic heart disease, unspecified: Secondary | ICD-10-CM

## 2022-11-27 MED ORDER — DULOXETINE HCL 60 MG PO CPEP
60.0000 mg | ORAL_CAPSULE | Freq: Every day | ORAL | 1 refills | Status: DC
  Filled 2022-11-27: qty 30, 30d supply, fill #0

## 2022-11-27 MED ORDER — ALBUTEROL SULFATE HFA 108 (90 BASE) MCG/ACT IN AERS
1.0000 | INHALATION_SPRAY | Freq: Four times a day (QID) | RESPIRATORY_TRACT | 3 refills | Status: DC | PRN
  Filled 2022-11-27: qty 18, 25d supply, fill #0
  Filled 2023-03-14 (×2): qty 18, 25d supply, fill #1
  Filled 2023-04-27: qty 18, 25d supply, fill #2

## 2022-11-27 MED ORDER — AMLODIPINE BESYLATE 10 MG PO TABS
10.0000 mg | ORAL_TABLET | Freq: Every day | ORAL | 1 refills | Status: DC
  Filled 2022-11-27 – 2022-12-12 (×3): qty 90, 90d supply, fill #0

## 2022-11-27 MED ORDER — ALBUTEROL SULFATE HFA 108 (90 BASE) MCG/ACT IN AERS
1.0000 | INHALATION_SPRAY | Freq: Four times a day (QID) | RESPIRATORY_TRACT | 3 refills | Status: DC | PRN
  Filled 2022-11-27: qty 6.7, 25d supply, fill #0
  Filled 2022-11-27: qty 6.7, 10d supply, fill #0

## 2022-11-27 MED ORDER — GABAPENTIN 300 MG PO CAPS
300.0000 mg | ORAL_CAPSULE | Freq: Every day | ORAL | 1 refills | Status: DC
  Filled 2022-11-27: qty 90, 90d supply, fill #0

## 2022-11-27 MED ORDER — ATORVASTATIN CALCIUM 20 MG PO TABS
20.0000 mg | ORAL_TABLET | Freq: Every day | ORAL | 1 refills | Status: DC
  Filled 2022-11-27 (×2): qty 90, 90d supply, fill #0
  Filled 2023-03-14 (×2): qty 90, 90d supply, fill #1

## 2022-11-27 NOTE — Progress Notes (Signed)
Np BP medications in 3 months Having chest pain and left arm pain.

## 2022-11-27 NOTE — Patient Instructions (Addendum)
Please call for your colonoscopy appointment: [facility] [address], [place] [address] PH# [phone]   Due to your ongoing chest pain you need to be evaluated in the emergency room as your EKG reveals a possibility of reduced blood supply to sections of your heart.

## 2022-11-27 NOTE — Progress Notes (Signed)
Subjective:  Patient ID: Steven Li, male    DOB: 1966/08/08  Age: 57 y.o. MRN: [id number]  CC: Hypertension   HPI Steven Li is a 57 y.o. year old male with a history of hypertension, tobacco abuse (greater than 20-pack-year), alcohol abuse here for chronic disease management.   Interval History:  He has been out of medications x 2 months and has been out of his inhaler as well.  His blood pressure is elevated today.  He has also been out of his statin. His left arm hurts and he has left sided chest pain that causes him to bend over at work. At the moment pain is a 4/5. Chest pain is asociated with dyspea but no diaphoresis, nausea or vomiting .  Continues to smoke 1 pack of Cigarettes/ day. He picked up on the amount of cigarettes he has been smoking after his sister passed in 09/2022 Past Medical History:  Diagnosis Date   COPD (chronic obstructive pulmonary disease) (HCC)    ETOH abuse    Headache    "a few/year" (04/17/2018)   High cholesterol    Hypertension    Migraine    "a few/year" (04/17/2018)    Past Surgical History:  Procedure Laterality Date   NO PAST SURGERIES      Family History  Problem Relation Age of Onset   Cirrhosis Mother        alcoholic   Stomach cancer Mother    Cirrhosis Father        alcoholic    Diabetes Sister     Social History   Socioeconomic History   Marital status: Divorced    Spouse name: Not on file   Number of children: Not on file   Years of education: Not on file   Highest education level: Not on file  Occupational History   Not on file  Tobacco Use   Smoking status: Every Day    Packs/day: 0.50    Years: 33.00    Total pack years: 16.50    Types: Cigarettes   Smokeless tobacco: Never  Vaping Use   Vaping Use: Never used  Substance and Sexual Activity   Alcohol use: Yes    Alcohol/week: 42.0 standard drinks of alcohol    Types: 42 Cans of beer per week    Comment: 04/27/2022 6 pack a day   Drug use: Not  Currently    Types: Marijuana    Comment: 04/17/2018 "qd"   Sexual activity: Not Currently  Other Topics Concern   Not on file  Social History Narrative   Not on file   Social Determinants of Health   Financial Resource Strain: Not on file  Food Insecurity: Not on file  Transportation Needs: Not on file  Physical Activity: Not on file  Stress: Not on file  Social Connections: Not on file    No Known Allergies  Outpatient Medications Prior to Visit  Medication Sig Dispense Refill   albuterol (VENTOLIN HFA) 108 (90 Base) MCG/ACT inhaler Inhale 1-2 puffs into the lungs every 6 (six) hours as needed for wheezing or shortness of breath. (Patient not taking: Reported on 11/27/2022) 18 g 3   amLODipine (NORVASC) 10 MG tablet Take 1 tablet (10 mg total) by mouth daily. (Patient not taking: Reported on 11/27/2022) 90 tablet 0   atorvastatin (LIPITOR) 20 MG tablet Take 1 tablet (20 mg total) by mouth daily. (Patient not taking: Reported on 11/27/2022) 90 tablet 1   DULoxetine (CYMBALTA) 60  MG capsule Take 1 capsule (60 mg total) by mouth daily. For pain (Patient not taking: Reported on 11/27/2022) 30 capsule 3   gabapentin (NEURONTIN) 300 MG capsule Take 1 capsule (300 mg total) by mouth at bedtime. (Patient not taking: Reported on 11/27/2022) 90 capsule 1   No facility-administered medications prior to visit.     ROS Review of Systems  Constitutional:  Negative for activity change and appetite change.  HENT:  Negative for sinus pressure and sore throat.   Respiratory:  Negative for shortness of breath and wheezing.   Cardiovascular:  Positive for chest pain. Negative for palpitations.  Gastrointestinal:  Negative for abdominal distention, abdominal pain and constipation.  Genitourinary: Negative.   Musculoskeletal: Negative.   Psychiatric/Behavioral:  Negative for behavioral problems and dysphoric mood.     Objective:  BP (!) 182/82   Pulse 75   Temp 98.2 F (36.8 C) (Oral)   Ht 5'  9" (1.753 m)   Wt 120 lb 12.8 oz (54.8 kg)   SpO2 100%   BMI 17.84 kg/m      11/27/2022    9:48 AM 05/27/2022    9:23 AM 05/13/2022    9:54 AM  BP/Weight  Systolic BP [id number] Q000111Q [id number]  Diastolic BP 82 86 70  Wt. (Lbs) 120.8 130   BMI 17.84 kg/m2 19.77 kg/m2       Physical Exam Constitutional:      Appearance: He is well-developed.  Cardiovascular:     Rate and Rhythm: Normal rate.     Heart sounds: Normal heart sounds. No murmur heard. Pulmonary:     Effort: Pulmonary effort is normal.     Breath sounds: Normal breath sounds. No wheezing or rales.  Chest:     Chest wall: No tenderness.  Abdominal:     General: Bowel sounds are normal. There is no distension.     Palpations: Abdomen is soft. There is no mass.     Tenderness: There is no abdominal tenderness.  Musculoskeletal:        General: Normal range of motion.     Right lower leg: No edema.     Left lower leg: No edema.  Neurological:     Mental Status: He is alert and oriented to person, place, and time.  Psychiatric:        Mood and Affect: Mood normal.        Latest Ref Rng & Units 05/13/2022    3:21 AM 04/27/2022    9:00 PM 04/27/2022    8:57 PM  CMP  Glucose 70 - 99 mg/dL 100  90  89   BUN 6 - 20 mg/dL 14  11  10   $ Creatinine 0.61 - 1.24 mg/dL 1.06  0.70  0.81   Sodium 135 - 145 mmol/L 139  134  137   Potassium 3.5 - 5.1 mmol/L 4.3  4.1  4.2   Chloride 98 - 111 mmol/L 105  105  102   CO2 22 - 32 mmol/L 24   23   Calcium 8.9 - 10.3 mg/dL 9.5   9.2   Total Protein 6.5 - 8.1 g/dL   7.3   Total Bilirubin 0.3 - 1.2 mg/dL   0.7   Alkaline Phos 38 - 126 U/L   86   AST 15 - 41 U/L   72   ALT 0 - 44 U/L   32     Lipid Panel     Component Value Date/Time   CHOL  239 (H) 11/27/2021 0921   TRIG 104 11/27/2021 0921   HDL 136 11/27/2021 0921   CHOLHDL 1.8 05/15/2020 0932   LDLCALC 86 11/27/2021 0921    CBC    Component Value Date/Time   WBC 7.5 05/13/2022 0321   RBC 3.19 (L) 05/13/2022 0321   HGB 10.3  (L) 05/13/2022 0321   HGB 12.8 (L) 02/01/2019 0921   HCT 31.4 (L) 05/13/2022 0321   HCT 36.6 (L) 02/01/2019 0921   PLT 355 05/13/2022 0321   PLT 321 02/01/2019 0921   MCV 98.4 05/13/2022 0321   MCV 88 02/01/2019 0921   MCH 32.3 05/13/2022 0321   MCHC 32.8 05/13/2022 0321   RDW 12.6 05/13/2022 0321   RDW 11.8 02/01/2019 0921   LYMPHSABS 1.8 05/13/2022 0321   MONOABS 1.4 (H) 05/13/2022 0321   EOSABS 0.3 05/13/2022 0321   BASOSABS 0.1 05/13/2022 0321    Lab Results  Component Value Date   HGBA1C 5.2 11/27/2021    Assessment & Plan:  1. Essential hypertension Uncontrolled due to the fact that he has been out of his medication which I have refilled Will reassess blood pressure at next visit Counseled on blood pressure goal of less than 130/80, low-sodium, DASH diet, medication compliance, 150 minutes of moderate intensity exercise per week. Discussed medication compliance, adverse effects. - amLODipine (NORVASC) 10 MG tablet; Take 1 tablet (10 mg total) by mouth daily.  Dispense: 90 tablet; Refill: 1 - LP+Non-HDL Cholesterol - CMP14+EGFR  2. Pure hypercholesterolemia Uncontrolled He will need lipid panel at next visit Low-cholesterol diet - atorvastatin (LIPITOR) 20 MG tablet; Take 1 tablet (20 mg total) by mouth daily.  Dispense: 90 tablet; Refill: 1  3. Bilateral arm pain Stable - DULoxetine (CYMBALTA) 60 MG capsule; Take 1 capsule (60 mg total) by mouth daily. For pain  Dispense: 90 capsule; Refill: 1  4. Alcoholic peripheral neuropathy (HCC) Stable - gabapentin (NEURONTIN) 300 MG capsule; Take 1 capsule (300 mg total) by mouth at bedtime.  Dispense: 90 capsule; Refill: 1  5. Chest pain due to myocardial ischemia, unspecified ischemic chest pain type EKG reveals possible anterior ischemia with nonspecific ST abnormalities in anterior lead.  Sinus bradycardia with sinus arrhythmia, possible left atrial enlargement.  This is a new finding compared to previous EKG Strongly  advised he will need to go to the ED for ischemia workup as he would likely need a cardiac cath in the setting of recurrent chest pain but he declines He is high risk due to his uncontrolled hypertension and smoking. - Ambulatory referral to Cardiology  6. Smoking greater than 20 pack years Spent 3 minutes counseling on smoking cessation but he is not ready to quit - CT CHEST LUNG CANCER SCREENING LOW DOSE WO CONTRAST; Future  7. Screening for colon cancer - Ambulatory referral to Gastroenterology    Meds ordered this encounter  Medications   amLODipine (NORVASC) 10 MG tablet    Sig: Take 1 tablet (10 mg total) by mouth daily.    Dispense:  90 tablet    Refill:  1   atorvastatin (LIPITOR) 20 MG tablet    Sig: Take 1 tablet (20 mg total) by mouth daily.    Dispense:  90 tablet    Refill:  1   DULoxetine (CYMBALTA) 60 MG capsule    Sig: Take 1 capsule (60 mg total) by mouth daily. For pain    Dispense:  90 capsule    Refill:  1   gabapentin (NEURONTIN) 300  MG capsule    Sig: Take 1 capsule (300 mg total) by mouth at bedtime.    Dispense:  90 capsule    Refill:  1   albuterol (VENTOLIN HFA) 108 (90 Base) MCG/ACT inhaler    Sig: Inhale 1-2 puffs into the lungs every 6 (six) hours as needed for wheezing or shortness of breath.    Dispense:  6.7 g    Refill:  3    Follow-up: Return in about 1 month (around 12/26/2022) for Blood Pressure follow-up.       Charlott Rakes, MD, FAAFP. [facility] [place], [place]   11/27/2022, 12:42 PM

## 2022-11-28 ENCOUNTER — Other Ambulatory Visit: Payer: Self-pay

## 2022-11-28 LAB — CMP14+EGFR
ALT: 14 IU/L (ref 0–44)
AST: 38 IU/L (ref 0–40)
Albumin/Globulin Ratio: 1.6 (ref 1.2–2.2)
Albumin: 4.6 g/dL (ref 3.8–4.9)
Alkaline Phosphatase: 89 IU/L (ref 44–121)
BUN/Creatinine Ratio: 14 (ref 9–20)
BUN: 12 mg/dL (ref 6–24)
Bilirubin Total: 0.2 mg/dL (ref 0.0–1.2)
CO2: 24 mmol/L (ref 20–29)
Calcium: 9.4 mg/dL (ref 8.7–10.2)
Chloride: 96 mmol/L (ref 96–106)
Creatinine, Ser: 0.83 mg/dL (ref 0.76–1.27)
Globulin, Total: 2.8 g/dL (ref 1.5–4.5)
Glucose: 81 mg/dL (ref 70–99)
Potassium: 4.6 mmol/L (ref 3.5–5.2)
Sodium: 137 mmol/L (ref 134–144)
Total Protein: 7.4 g/dL (ref 6.0–8.5)
eGFR: 101 mL/min/{1.73_m2} (ref >59)

## 2022-11-28 LAB — LP+NON-HDL CHOLESTEROL
Cholesterol, Total: 213 mg/dL — ABNORMAL HIGH (ref 100–199)
HDL: 135 mg/dL (ref >39)
LDL Chol Calc (NIH): 53 mg/dL (ref 0–99)
Total Non-HDL-Chol (LDL+VLDL): 78 mg/dL (ref 0–129)
Triglycerides: 162 mg/dL — ABNORMAL HIGH (ref 0–149)
VLDL Cholesterol Cal: 25 mg/dL (ref 5–40)

## 2022-12-12 ENCOUNTER — Other Ambulatory Visit: Payer: Self-pay

## 2022-12-13 ENCOUNTER — Other Ambulatory Visit: Payer: Self-pay

## 2022-12-23 ENCOUNTER — Ambulatory Visit (AMBULATORY_SURGERY_CENTER): Payer: Medicaid Other

## 2022-12-23 ENCOUNTER — Other Ambulatory Visit: Payer: Self-pay

## 2022-12-23 VITALS — Ht 68.0 in | Wt 120.0 lb

## 2022-12-23 DIAGNOSIS — Z1211 Encounter for screening for malignant neoplasm of colon: Secondary | ICD-10-CM

## 2022-12-23 MED ORDER — PEG 3350-KCL-NA BICARB-NACL 420 G PO SOLR
4000.0000 mL | Freq: Once | ORAL | 0 refills | Status: AC
  Filled 2022-12-23: qty 4000, 1d supply, fill #0

## 2022-12-23 NOTE — Progress Notes (Signed)
Pre visit completed via phone call; Patient verified name, DOB, and address;  No egg or soy allergy known to patient  No issues known to pt with past sedation with any surgeries or procedures---no past surgeries Patient denies ever being told they had issues or difficulty with intubation ----no past surgeries No FH of Malignant Hyperthermia Pt is not on diet pills Pt is not on home 02  Pt is not on blood thinners  Pt denies issues with constipation;  No A fib or A flutter Have any cardiac testing pending--NO Pt instructed to use [email or web address] or GoodRx for a price reduction on prep   Insurance verified during [place] appt=[employer]  Patient's chart reviewed by Osvaldo Angst CNRA prior to previsit and patient appropriate for the [place].  Previsit completed and red dot placed by patient's name on their procedure day (on provider's schedule).

## 2022-12-25 ENCOUNTER — Encounter: Payer: Self-pay | Admitting: Family Medicine

## 2022-12-26 ENCOUNTER — Ambulatory Visit: Payer: Medicaid Other | Attending: Internal Medicine | Admitting: Internal Medicine

## 2022-12-26 ENCOUNTER — Encounter: Payer: Self-pay | Admitting: Internal Medicine

## 2022-12-26 ENCOUNTER — Other Ambulatory Visit: Payer: Self-pay

## 2022-12-26 VITALS — BP 148/68 | HR 115 | Ht 68.0 in | Wt 120.0 lb

## 2022-12-26 DIAGNOSIS — I10 Essential (primary) hypertension: Secondary | ICD-10-CM

## 2022-12-26 DIAGNOSIS — E782 Mixed hyperlipidemia: Secondary | ICD-10-CM

## 2022-12-26 DIAGNOSIS — I700 Atherosclerosis of aorta: Secondary | ICD-10-CM

## 2022-12-26 DIAGNOSIS — F1010 Alcohol abuse, uncomplicated: Secondary | ICD-10-CM

## 2022-12-26 DIAGNOSIS — R079 Chest pain, unspecified: Secondary | ICD-10-CM | POA: Diagnosis not present

## 2022-12-26 DIAGNOSIS — F17210 Nicotine dependence, cigarettes, uncomplicated: Secondary | ICD-10-CM

## 2022-12-26 DIAGNOSIS — K860 Alcohol-induced chronic pancreatitis: Secondary | ICD-10-CM

## 2022-12-26 MED ORDER — BISOPROLOL FUMARATE 5 MG PO TABS
2.5000 mg | ORAL_TABLET | Freq: Every day | ORAL | 3 refills | Status: DC
  Filled 2022-12-26: qty 45, 90d supply, fill #0

## 2022-12-26 NOTE — Progress Notes (Addendum)
Cardiology Office Note:    Date:  12/26/2022   ID:  Steven Li, DOB 01/12/1966, MRN [id number]  PCP:  Charlott Rakes, [place] Providers Cardiologist:  Werner Lean, MD     Referring MD: Charlott Rakes, MD   CC: Chest and abdominal pain Consulted for the evaluation of chest pain at the behest of Dr. Margarita Rana  History of Present Illness:    Steven Li is a 57 y.o. male with a hx of Alcohol abuse (with GI bleeds, Anemia, and Pancreatitis) Tobacco abuse (with COPD) and Aortic atherosclerosis but non CAC.  2024 eval for CP.  Patient notes that he is feeling ok presently.   When he saw Dr. Margarita Rana, he was having chest pain whenever he bends, twists, and turns. 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  He was able to walk here without chest pain (did have notable tachycardia after walking here).  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  Drinks a six pack a day without liquor.  Smokes a pack a day for the past 30 years.  Smoke one joint a day.  No shortness of breath, but has DOE.  No PND or orthopnea.  No weight gain, leg swelling , or abdominal swelling.  No syncope or near syncope . Notes  no palpitations or funny heart beats.     Patient reports prior cardiac testing including prior lung screening with aortic atherosclerosis and no CAC.  No FHX of CAD.    Past Medical History:  Diagnosis Date   Chest pain    COPD (chronic obstructive pulmonary disease) (HCC)    PRN inhaler   ETOH abuse    Headache    "a few/year" (04/17/2018)   High cholesterol    on meds   Hypertension    on meds   Migraine    "a few/year" (04/17/2018)==hx of (12/23/2022)    Past Surgical History:  Procedure Laterality Date   NO PAST SURGERIES      Current Medications: Current Meds  Medication Sig   albuterol (VENTOLIN HFA) 108 (90  Base) MCG/ACT inhaler Inhale 1-2 puffs into the lungs every 6 (six) hours as needed for wheezing or shortness of breath.   amLODipine (NORVASC) 10 MG tablet Take 1 tablet (10 mg total) by mouth daily.   aspirin EC 81 MG tablet Take 81 mg by mouth daily. Swallow whole.   atorvastatin (LIPITOR) 20 MG tablet Take 1 tablet (20 mg total) by mouth daily.   bisoprolol (ZEBETA) 5 MG tablet Take 0.5 tablets (2.5 mg total) by mouth daily.   DULoxetine (CYMBALTA) 60 MG capsule Take 1 capsule (60 mg total) by mouth daily. For pain   gabapentin (NEURONTIN) 300 MG capsule Take 1 capsule (300 mg total) by mouth at bedtime.     Allergies:   Patient has no known allergies.   Social History   Socioeconomic History   Marital status: Divorced    Spouse name: Not on file   Number of children: Not on file   Years of education: Not on file   Highest education level: Not on file  Occupational History   Not on file  Tobacco Use   Smoking status: Every Day    Packs/day: 1.00    Years: 33.00    Additional pack years: 0.00  Total pack years: 33.00    Types: Cigarettes   Smokeless tobacco: Never  Vaping Use   Vaping Use: Never used  Substance and Sexual Activity   Alcohol use: Yes    Alcohol/week: 42.0 standard drinks of alcohol    Types: 42 Cans of beer per week    Comment: 12/23/2022= 6 pack a day   Drug use: Yes    Frequency: 7.0 times per week    Types: Marijuana    Comment: 12/23/2022   Sexual activity: Not Currently  Other Topics Concern   Not on file  Social History Narrative   Not on file   Social Determinants of Health   Financial Resource Strain: Not on file  Food Insecurity: Not on file  Transportation Needs: Not on file  Physical Activity: Not on file  Stress: Not on file  Social Connections: Not on file     Family History: The patient's family history includes Cirrhosis in his father and mother; Diabetes in his sister. There is no history of Colon polyps, Colon cancer,  Esophageal cancer, Rectal cancer, or Stomach cancer.  ROS:   Please see the history of present illness.     All other systems reviewed and are negative.  EKGs/Labs/Other Studies Reviewed:    The following studies were reviewed today:   EKG:   11/27/22: Sinus bradycardia with global ST elevations without reciprocal changes.   Recent Labs: 05/13/2022: Hemoglobin 10.3; Platelets 355 11/27/2022: ALT 14; BUN 12; Creatinine, Ser 0.83; Potassium 4.6; Sodium 137  Recent Lipid Panel    Component Value Date/Time   CHOL 213 (H) 11/27/2022 1047   TRIG 162 (H) 11/27/2022 1047   HDL 135 11/27/2022 1047   CHOLHDL 1.8 05/15/2020 0932   LDLCALC 53 11/27/2022 1047        Physical Exam:    VS:  BP (!) 148/68   Pulse (!) 115   Ht 5\' 8"  (1.727 m)   Wt 120 lb (54.4 kg)   SpO2 98%   BMI 18.25 kg/m     Wt Readings from Last 3 Encounters:  12/26/22 120 lb (54.4 kg)  12/23/22 120 lb (54.4 kg)  11/27/22 120 lb 12.8 oz (54.8 kg)    GEN:  Well nourished, well developed in no acute distress HEENT: Normal NECK: No JVD; No carotid bruits LYMPHATICS: No lymphadenopathy CARDIAC: RRR, no murmurs, rubs, gallops RESPIRATORY:  Clear to auscultation without rales, wheezing or rhonchi  ABDOMEN: Soft, non-tender, non-distended MUSCULOSKELETAL:  No edema; No deformity  SKIN: Warm and dry NEUROLOGIC:  Alert and oriented x 3 PSYCHIATRIC:  Normal affect   ASSESSMENT:    1. Chest pain, unspecified type   2. Essential hypertension   3. Mixed hyperlipidemia   4. Aortic atherosclerosis ([place])   5. Alcohol-induced chronic pancreatitis ([place])   6. Alcohol abuse   7. Nicotine dependence, cigarettes, uncomplicated    PLAN:    Chest pain syndrome - Atypical symptoms but with high risk factors - continue ASA; adding low dose bisoprolol  HTN - amlodipine 10 mg  HLD Aortic atherosclerosis - LDL at goal on current therapy  Tobacco abuse Alcohol abuse Marijuana abuse - we have discussed taper for  alcohol abuse to minimize withdrawal - we have discussed cigarette taper - we have discuss marijuana cessation - we reviewed BB and alcohol interaction  APP f/u in 3 months      Medication Adjustments/Labs and Tests Ordered: Current medicines are reviewed at length with the patient today.  Concerns regarding medicines  are outlined above.  Orders Placed This Encounter  Procedures   CT CORONARY MORPH W/CTA COR W/SCORE W/CA W/CM &/OR WO/CM   Basic metabolic panel   Meds ordered this encounter  Medications   bisoprolol (ZEBETA) 5 MG tablet    Sig: Take 0.5 tablets (2.5 mg total) by mouth daily.    Dispense:  45 tablet    Refill:  3    Please cut pill in half for pt    Patient Instructions  Medication Instructions:  Your physician has recommended you make the following change in your medication:  START: bisoprolol (Zebeta) 2.5 mg (1/2 pill) by mouth once a day  *If you need a refill on your cardiac medications before your next appointment, please call your pharmacy*   Lab Work: TODAY: BMP  If you have labs (blood work) drawn today and your tests are completely normal, you will receive your results only by: [place] (if you have MyChart) OR A paper copy in the mail If you have any lab test that is abnormal or we need to change your treatment, we will call you to review the results.   Testing/Procedures: Your physician has requested that you have cardiac CT. Cardiac computed tomography (CT) is a painless test that uses an x-ray machine to take clear, detailed pictures of your heart. For further information please visit HugeFiesta.[email or web address]. Please follow instruction sheet as given.     Follow-Up: At [facility], you and your health needs are our priority.  As part of our continuing mission to provide you with exceptional heart care, we have created designated Provider Care Teams.  These Care Teams include your primary Cardiologist (physician) and Advanced  Practice Providers (APPs -  Physician Assistants and Nurse Practitioners) who all work together to provide you with the care you need, when you need it.  We recommend signing up for the patient portal called "MyChart".  Sign up information is provided on this After Visit Summary.  MyChart is used to connect with patients for Virtual Visits (Telemedicine).  Patients are able to view lab/test results, encounter notes, upcoming appointments, etc.  Non-urgent messages can be sent to your provider as well.   To learn more about what you can do with MyChart, go to [email or web address].    Your next appointment:   3 month(s)  Provider:   Nicholes Rough, PA-C, Ambrose Pancoast, NP, or Christen Bame, NP        Other Instructions   Your cardiac CT will be scheduled at one of the below locations:   [facility] [address] [place], [place] [address] [phone]  [place] [address] [place], [place] [address] [phone]  [facility] [place], [place] [address] [phone]  If scheduled at [facility], please arrive at the [facility] and Children's Entrance (Entrance C2) of [facility] 30 minutes prior to test start time. You can use the FREE valet parking offered at entrance C (encouraged to control the heart rate for the test)  Proceed to the [facility] Radiology Department (first floor) to check-in and test prep.  All radiology patients and guests should use entrance C2 at [facility], accessed from [facility], even though the hospital's physical address listed is [address]    If scheduled at [facility] or [facility], please arrive 15 mins early for  check-in and test prep.   Please follow these instructions carefully (unless otherwise directed):  Hold all erectile  dysfunction medications at least 3 days (72 hrs) prior to test. (Ie viagra, cialis, sildenafil, tadalafil, etc) We will administer nitroglycerin during this exam.   On the Night Before the Test: Be sure to Drink plenty of water. Do not consume any caffeinated/decaffeinated beverages or chocolate 12 hours prior to your test. Do not take any antihistamines 12 hours prior to your test.  On the Day of the Test: Drink plenty of water until 1 hour prior to the test. Do not eat any food 1 hour prior to test. You may take your regular medications prior to the test.  If you take Furosemide/Hydrochlorothiazide/Spironolactone, please HOLD on the morning of the test.        After the Test: Drink plenty of water. After receiving IV contrast, you may experience a mild flushed feeling. This is normal. On occasion, you may experience a mild rash up to 24 hours after the test. This is not dangerous. If this occurs, you can take Benadryl 25 mg and increase your fluid intake. If you experience trouble breathing, this can be serious. If it is severe call 911 IMMEDIATELY. If it is mild, please call our office. If you take any of these medications: Glipizide/Metformin, Avandament, Glucavance, please do not take 48 hours after completing test unless otherwise instructed.  We will call to schedule your test 2-4 weeks out understanding that some insurance companies will need an authorization prior to the service being performed.   For non-scheduling related questions, please contact the cardiac imaging nurse navigator should you have any questions/concerns: Marchia Bond, Cardiac Imaging Nurse Navigator Gordy Clement, Cardiac Imaging Nurse Navigator [facility] Direct Office Dial: [phone]   For scheduling needs, including cancellations and rescheduling, please call [place], [phone].     Signed, Werner Lean, MD  12/26/2022 9:15 AM    [place]

## 2022-12-26 NOTE — Patient Instructions (Signed)
Medication Instructions:  Your physician has recommended you make the following change in your medication:  START: bisoprolol (Zebeta) 2.5 mg (1/2 pill) by mouth once a day  *If you need a refill on your cardiac medications before your next appointment, please call your pharmacy*   Lab Work: TODAY: BMP  If you have labs (blood work) drawn today and your tests are completely normal, you will receive your results only by: [facility] (if you have MyChart) OR A paper copy in the mail If you have any lab test that is abnormal or we need to change your treatment, we will call you to review the results.   Testing/Procedures: Your physician has requested that you have cardiac CT. Cardiac computed tomography (CT) is a painless test that uses an x-ray machine to take clear, detailed pictures of your heart. For further information please visit [email or web address]. Please follow instruction sheet as given.     Follow-Up: At [facility], you and your health needs are our priority.  As part of our continuing mission to provide you with exceptional heart care, we have created designated Provider Care Teams.  These Care Teams include your primary Cardiologist (physician) and Advanced Practice Providers (APPs -  Physician Assistants and Nurse Practitioners) who all work together to provide you with the care you need, when you need it.  We recommend signing up for the patient portal called "MyChart".  Sign up information is provided on this After Visit Summary.  MyChart is used to connect with patients for Virtual Visits (Telemedicine).  Patients are able to view lab/test results, encounter notes, upcoming appointments, etc.  Non-urgent messages can be sent to your provider as well.   To learn more about what you can do with MyChart, go to [email or web address].    Your next appointment:   3 month(s)  Provider:   Nicholes Rough, PA-C, Ambrose Pancoast, NP, or Christen Bame, NP         Other Instructions   Your cardiac CT will be scheduled at one of the below locations:   [facility] [address] [place], [place] [address] [phone]  [place] [address] [place], [place] [address] [phone]  [facility] [place], [place] [address] [phone]  If scheduled at [facility], please arrive at the [facility] En[facility] (Entrance C2) of [facility] 30 minutes prior to test start time. You can use the FREE valet parking offered at entrance C (encouraged to control the heart rate for the test)  Proceed to the [facility] Radiology Department (first floor) to check-in and test prep.  All radiology patients and guests should use entrance C2 at [facility], accessed from [facility], even though the hospital's physical address listed is [address].    If scheduled at [facility] or [facility], please arrive 15 mins early for check-in and test prep.   Please follow these instructions carefully (unless otherwise directed):  Hold all erectile dysfunction medications at least 3 days (72 hrs) prior to test. (Ie viagra, cialis, sildenafil, tadalafil, etc) We will administer nitroglycerin during this exam.   On the Night Before the Test: Be sure to Drink plenty of water. Do not consume any caffeinated/decaffeinated beverages or chocolate 12 hours prior to your test. Do not take any antihistamines 12 hours prior to your test.  On  the Day of the Test: Drink plenty of water until 1 hour prior to the test. Do not eat any food 1 hour prior to test. You may take your regular medications prior to the test.  If you take Furosemide/Hydrochlorothiazide/Spironolactone, please HOLD on the morning of the test.        After the Test: Drink plenty  of water. After receiving IV contrast, you may experience a mild flushed feeling. This is normal. On occasion, you may experience a mild rash up to 24 hours after the test. This is not dangerous. If this occurs, you can take Benadryl 25 mg and increase your fluid intake. If you experience trouble breathing, this can be serious. If it is severe call 911 IMMEDIATELY. If it is mild, please call our office. If you take any of these medications: Glipizide/Metformin, Avandament, Glucavance, please do not take 48 hours after completing test unless otherwise instructed.  We will call to schedule your test 2-4 weeks out understanding that some insurance companies will need an authorization prior to the service being performed.   For non-scheduling related questions, please contact the cardiac imaging nurse navigator should you have any questions/concerns: Marchia Bond, Cardiac Imaging Nurse Navigator Gordy Clement, Cardiac Imaging Nurse Navigator [facility] Direct Office Dial: [phone]   For scheduling needs, including cancellations and rescheduling, please call [place], [phone].

## 2022-12-27 ENCOUNTER — Other Ambulatory Visit: Payer: Self-pay

## 2022-12-27 ENCOUNTER — Telehealth: Payer: Self-pay | Admitting: Internal Medicine

## 2022-12-27 LAB — BASIC METABOLIC PANEL
BUN/Creatinine Ratio: 17 (ref 9–20)
BUN: 11 mg/dL (ref 6–24)
CO2: 26 mmol/L (ref 20–29)
Calcium: 9.5 mg/dL (ref 8.7–10.2)
Chloride: 98 mmol/L (ref 96–106)
Creatinine, Ser: 0.64 mg/dL — ABNORMAL LOW (ref 0.76–1.27)
Glucose: 108 mg/dL — ABNORMAL HIGH (ref 70–99)
Potassium: 4.4 mmol/L (ref 3.5–5.2)
Sodium: 139 mmol/L (ref 134–144)
eGFR: 110 mL/min/{1.73_m2} (ref >59)

## 2022-12-27 MED ORDER — CARVEDILOL 6.25 MG PO TABS
6.2500 mg | ORAL_TABLET | Freq: Two times a day (BID) | ORAL | 3 refills | Status: DC
  Filled 2022-12-27: qty 180, 90d supply, fill #0

## 2022-12-27 NOTE — Telephone Encounter (Signed)
Called pt advised of MD response: We can try Coreg 6.25 mg PO BID and see how he tolerates it.  Reviewed instructions with pt. Bisoprolol discontinued.  Order for carvedilol 6.25 mg sent to pharmacy on file.

## 2022-12-27 NOTE — Telephone Encounter (Signed)
Pt c/o medication issue:  1. Name of Medication:   bisoprolol (ZEBETA) 5 MG tablet    2. How are you currently taking this medication (dosage and times per day)? Take 0.5 tablets (2.5 mg total) by mouth daily.   3. Are you having a reaction (difficulty breathing--STAT)? No  4. What is your medication issue? Pt states that pharmacy notified him the his insurance will not cover medication. He would like to know if something else can be prescribed. Please advise

## 2022-12-31 ENCOUNTER — Ambulatory Visit
Admission: RE | Admit: 2022-12-31 | Discharge: 2022-12-31 | Disposition: A | Discharge status: Home / Self Care | Payer: Medicaid Other | Source: Ambulatory Visit | Attending: Family Medicine | Admitting: Family Medicine

## 2022-12-31 DIAGNOSIS — F17210 Nicotine dependence, cigarettes, uncomplicated: Secondary | ICD-10-CM

## 2023-01-02 ENCOUNTER — Other Ambulatory Visit: Payer: Self-pay

## 2023-01-02 ENCOUNTER — Emergency Department (HOSPITAL_COMMUNITY)
Admission: EM | Admit: 2023-01-02 | Discharge: 2023-01-02 | Disposition: A | Discharge status: Home / Self Care | Payer: Medicaid Other | Attending: Emergency Medicine | Admitting: Emergency Medicine

## 2023-01-02 ENCOUNTER — Encounter (HOSPITAL_COMMUNITY): Payer: Self-pay

## 2023-01-02 ENCOUNTER — Emergency Department (HOSPITAL_COMMUNITY): Payer: Medicaid Other

## 2023-01-02 DIAGNOSIS — Z7982 Long term (current) use of aspirin: Secondary | ICD-10-CM | POA: Diagnosis not present

## 2023-01-02 DIAGNOSIS — K2920 Alcoholic gastritis without bleeding: Secondary | ICD-10-CM | POA: Insufficient documentation

## 2023-01-02 DIAGNOSIS — R197 Diarrhea, unspecified: Secondary | ICD-10-CM

## 2023-01-02 DIAGNOSIS — R1084 Generalized abdominal pain: Secondary | ICD-10-CM | POA: Diagnosis present

## 2023-01-02 LAB — LIPASE, BLOOD: Lipase: 27 U/L (ref 11–51)

## 2023-01-02 LAB — CBC
HCT: 43.7 % (ref 39.0–52.0)
Hemoglobin: 15.8 g/dL (ref 13.0–17.0)
MCH: 32.8 pg (ref 26.0–34.0)
MCHC: 36.2 g/dL — ABNORMAL HIGH (ref 30.0–36.0)
MCV: 90.9 fL (ref 80.0–100.0)
Platelets: 277 10*3/uL (ref 150–400)
RBC: 4.81 MIL/uL (ref 4.22–5.81)
RDW: 11.8 % (ref 11.5–15.5)
WBC: 6.9 10*3/uL (ref 4.0–10.5)
nRBC: 0 % (ref 0.0–0.2)

## 2023-01-02 LAB — COMPREHENSIVE METABOLIC PANEL
ALT: 30 U/L (ref 0–44)
AST: 65 U/L — ABNORMAL HIGH (ref 15–41)
Albumin: 3.9 g/dL (ref 3.5–5.0)
Alkaline Phosphatase: 74 U/L (ref 38–126)
Anion gap: 15 (ref 5–15)
BUN: 14 mg/dL (ref 6–20)
CO2: 24 mmol/L (ref 22–32)
Calcium: 9.1 mg/dL (ref 8.9–10.3)
Chloride: 93 mmol/L — ABNORMAL LOW (ref 98–111)
Creatinine, Ser: 1.25 mg/dL — ABNORMAL HIGH (ref 0.61–1.24)
GFR, Estimated: >60 mL/min (ref >60)
Glucose, Bld: 135 mg/dL — ABNORMAL HIGH (ref 70–99)
Potassium: 4 mmol/L (ref 3.5–5.1)
Sodium: 132 mmol/L — ABNORMAL LOW (ref 135–145)
Total Bilirubin: 1.3 mg/dL — ABNORMAL HIGH (ref 0.3–1.2)
Total Protein: 7.2 g/dL (ref 6.5–8.1)

## 2023-01-02 MED ORDER — ONDANSETRON HCL 4 MG/2ML IJ SOLN
4.0000 mg | Freq: Once | INTRAMUSCULAR | Status: AC
  Administered 2023-01-02: 4 mg via INTRAVENOUS
  Filled 2023-01-02: qty 2

## 2023-01-02 MED ORDER — SODIUM CHLORIDE 0.9 % IV BOLUS
1000.0000 mL | Freq: Once | INTRAVENOUS | Status: AC
Start: 2023-01-02 — End: 2023-01-02
  Administered 2023-01-02: 1000 mL via INTRAVENOUS

## 2023-01-02 MED ORDER — ONDANSETRON HCL 4 MG/2ML IJ SOLN: 4.0000 mg | Freq: Once | INTRAMUSCULAR | Status: DC

## 2023-01-02 MED ORDER — IOHEXOL 350 MG/ML SOLN
75.0000 mL | Freq: Once | INTRAVENOUS | Status: AC | PRN
  Administered 2023-01-02: 75 mL via INTRAVENOUS

## 2023-01-02 MED ORDER — ONDANSETRON HCL 4 MG PO TABS
4.0000 mg | ORAL_TABLET | Freq: Every day | ORAL | 1 refills | Status: AC | PRN
  Filled 2023-01-02: qty 12, 12d supply, fill #0

## 2023-01-02 MED ORDER — HYDROMORPHONE HCL 1 MG/ML IJ SOLN
1.0000 mg | Freq: Once | INTRAMUSCULAR | Status: AC
  Administered 2023-01-02: 1 mg via INTRAVENOUS
  Filled 2023-01-02: qty 1

## 2023-01-02 NOTE — Discharge Instructions (Addendum)
Avoid drinking alcohol.  Keep cardiology follow up appointment and Gi appointment.

## 2023-01-02 NOTE — ED Provider Notes (Signed)
[facility] Provider Note   CSN: [id number] Arrival date & time: 01/02/23  [id number]     History  Chief Complaint  Patient presents with   Abdominal Pain    Steven Li is a 57 y.o. male.  Pt reports he has history of pancreatitis. He reports pain in his abdomen began on Tuesday after drinking alcohol.  Pt complains of vomiting.  Pt reports he is suppose to have a colonoscopy soon.    The history is provided by the patient. No language interpreter was used.  Abdominal Pain Pain location:  Generalized Pain quality: aching   Pain radiates to:  Epigastric region Pain severity:  Moderate Onset quality:  Gradual Duration:  2 days Progression:  Worsening Chronicity:  New Context: alcohol use        Home Medications Prior to Admission medications   Medication Sig Start Date End Date Taking? Authorizing Provider  albuterol (VENTOLIN HFA) 108 (90 Base) MCG/ACT inhaler Inhale 1-2 puffs into the lungs every 6 (six) hours as needed for wheezing or shortness of breath. 11/27/22   Charlott Rakes, MD  amLODipine (NORVASC) 10 MG tablet Take 1 tablet (10 mg total) by mouth daily. 11/27/22   Charlott Rakes, MD  aspirin EC 81 MG tablet Take 81 mg by mouth daily. Swallow whole.    [provider]  atorvastatin (LIPITOR) 20 MG tablet Take 1 tablet (20 mg total) by mouth daily. 11/27/22   Charlott Rakes, MD  carvedilol (COREG) 6.25 MG tablet Take 1 tablet (6.25 mg total) by mouth 2 (two) times daily. 12/27/22   Chandrasekhar, Terisa Starr, MD  DULoxetine (CYMBALTA) 60 MG capsule Take 1 capsule (60 mg total) by mouth daily. For pain 11/27/22   Charlott Rakes, MD  gabapentin (NEURONTIN) 300 MG capsule Take 1 capsule (300 mg total) by mouth at bedtime. 11/27/22   Charlott Rakes, MD      Allergies    Patient has no known allergies.    Review of Systems   Review of Systems  Gastrointestinal:  Positive for abdominal pain.  All other systems  reviewed and are negative.   Physical Exam Updated Vital Signs BP (!) 159/101   Pulse 98   Temp 98.5 F (36.9 C) (Oral)   Resp 13   Ht 5\' 8"  (1.727 m)   Wt 54.4 kg   SpO2 92%   BMI 18.25 kg/m  Physical Exam Vitals and nursing note reviewed.  Constitutional:      Appearance: He is well-developed.  HENT:     Head: Normocephalic.  Cardiovascular:     Rate and Rhythm: Normal rate and regular rhythm.  Pulmonary:     Effort: Pulmonary effort is normal.  Abdominal:     General: Abdomen is flat. Bowel sounds are normal. There is no distension.     Tenderness: There is generalized abdominal tenderness.  Musculoskeletal:        General: Normal range of motion.     Cervical back: Normal range of motion.  Neurological:     General: No focal deficit present.     Mental Status: He is alert and oriented to person, place, and time.  Psychiatric:        Mood and Affect: Mood normal.     ED Results / Procedures / Treatments   Labs (all labs ordered are listed, but only abnormal results are displayed) Labs Reviewed  COMPREHENSIVE METABOLIC PANEL - Abnormal; Notable for the following components:  Result Value   Sodium 132 (*)    Chloride 93 (*)    Glucose, Bld 135 (*)    Creatinine, Ser 1.25 (*)    AST 65 (*)    Total Bilirubin 1.3 (*)    All other components within normal limits  CBC - Abnormal; Notable for the following components:   MCHC 36.2 (*)    All other components within normal limits  LIPASE, BLOOD    EKG EKG Interpretation  Date/Time:  Thursday January 02 2023 08:14:32 EDT Ventricular Rate:  118 PR Interval:  150 QRS Duration: 92 QT Interval:  334 QTC Calculation: 468 R Axis:   82 Text Interpretation: Sinus tachycardia Ventricular premature complex Right atrial enlargement Consider left ventricular hypertrophy Since last tracing rate faster Confirmed by Dorie Rank [phone]) on 01/02/2023 8:17:35 AM  Radiology CT ABDOMEN PELVIS W CONTRAST  Result Date:  01/02/2023 CLINICAL DATA:  Abdominal pain, acute, nonlocalized. EXAM: CT ABDOMEN AND PELVIS WITH CONTRAST TECHNIQUE: Multidetector CT imaging of the abdomen and pelvis was performed using the standard protocol following bolus administration of intravenous contrast. RADIATION DOSE REDUCTION: This exam was performed according to the departmental dose-optimization program which includes automated exposure control, adjustment of the mA and/or kV according to patient size and/or use of iterative reconstruction technique. CONTRAST:  21mL OMNIPAQUE IOHEXOL 350 MG/ML SOLN COMPARISON:  CT abdomen/pelvis 04/27/2022. FINDINGS: Lower chest: No acute abnormality. Hepatobiliary: No focal liver abnormality is seen. No gallstones, gallbladder wall thickening, or biliary dilatation. Pancreas: Unremarkable. No pancreatic ductal dilatation or surrounding inflammatory changes. Spleen: Normal in size without focal abnormality. Adrenals/Urinary Tract: Adrenal glands are unremarkable. Kidneys are normal, without renal calculi, mass, or hydronephrosis. Moderately distended bladder. Stomach/Bowel: Normal stomach and duodenum. No dilated loops of small bowel. Normal appendix is visualized on image 50 series 3. Fluid-filled proximal colon with mild mucosal hyperemia. Distal colon is decompressed. No bowel wall thickening or surrounding inflammation. Vascular/Lymphatic: Aortic atherosclerosis. No enlarged abdominal or pelvic lymph nodes. Reproductive: Prostate is unremarkable. Other: No abdominal wall hernia or abnormality. No abdominopelvic ascites. Musculoskeletal: No acute or significant osseous findings. IMPRESSION: 1. Fluid-filled proximal colon with mild mucosal hyperemia, which can be seen with diarrheal illness. 2. Moderately distended bladder. 3. Aortic Atherosclerosis (ICD10-I70.0). Electronically Signed   By: Emmit Alexanders M.D.   On: 01/02/2023 10:34    Procedures Procedures    Medications Ordered in ED Medications  sodium  chloride 0.9 % bolus 1,000 mL (0 mLs Intravenous Stopped 01/02/23 0922)  ondansetron (ZOFRAN) injection 4 mg (4 mg Intravenous Given 01/02/23 0824)  HYDROmorphone (DILAUDID) injection 1 mg (1 mg Intravenous Given 01/02/23 0921)  iohexol (OMNIPAQUE) 350 MG/ML injection 75 mL (75 mLs Intravenous Contrast Given 01/02/23 1026)    ED Course/ Medical Decision Making/ A&P                             Medical Decision Making Pt complains of abdominal pain, vomiting and diarrhea.    Amount and/or Complexity of Data Reviewed Labs: ordered. Decision-making details documented in ED Course.    Details: Labs ordered reviewed and interpreted.  Sodium is 132.  Creatinine is 1.25 Radiology: ordered and independent interpretation performed. Decision-making details documented in ED Course.    Details: CT abdomen shows fluid-filled proximal colon consistent with diarrhea illness.  Risk Prescription drug management. Risk Details: Patient given IV fluids Zofran Dilaudid for discomfort.  On reevaluation patient reports he feels much better.  Patient has  medications at home for pain he reports he does not currently have any nausea medicines.  Patient is given a prescription for Zofran.  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    Sidney Ace 01/02/23 1239    Dorie Rank, MD 01/03/23 [phone]

## 2023-01-02 NOTE — ED Notes (Signed)
Bp 157/103 update nurse

## 2023-01-02 NOTE — ED Notes (Signed)
Bp 160/107 update the nurse

## 2023-01-02 NOTE — ED Triage Notes (Signed)
N/V/D and lower abdominal pain x2 days. Denies fever, SOB, CP. LBM today, unable to tolerate PO since Tuesday

## 2023-01-03 ENCOUNTER — Telehealth (HOSPITAL_COMMUNITY): Payer: Self-pay | Admitting: *Deleted

## 2023-01-03 NOTE — Telephone Encounter (Signed)
Attempted to call patient regarding upcoming cardiac CT appointment. Patient needing to reschedule due to auth.  Left message on voicemail with name and call back number.  Gordy Clement RN Navigator Cardiac Imaging [facility] [phone] Office [phone] Cell

## 2023-01-06 ENCOUNTER — Ambulatory Visit (HOSPITAL_COMMUNITY): Admission: RE | Admit: 2023-01-06 | Payer: Medicaid Other | Source: Ambulatory Visit

## 2023-01-08 ENCOUNTER — Telehealth (HOSPITAL_COMMUNITY): Payer: Self-pay | Admitting: Emergency Medicine

## 2023-01-08 NOTE — Telephone Encounter (Signed)
Attempted to call patient regarding upcoming cardiac CT appointment. °Left message on voicemail with name and callback number °Makaley Storts RN Navigator Cardiac Imaging °[facility] °[phone] Office °[phone] Cell ° °

## 2023-01-09 ENCOUNTER — Ambulatory Visit (HOSPITAL_COMMUNITY)
Admission: RE | Admit: 2023-01-09 | Discharge: 2023-01-09 | Disposition: A | Discharge status: Home / Self Care | Payer: Medicaid Other | Source: Ambulatory Visit | Attending: Internal Medicine | Admitting: Internal Medicine

## 2023-01-09 DIAGNOSIS — R079 Chest pain, unspecified: Secondary | ICD-10-CM | POA: Insufficient documentation

## 2023-01-09 DIAGNOSIS — R072 Precordial pain: Secondary | ICD-10-CM | POA: Diagnosis not present

## 2023-01-09 MED ORDER — DILTIAZEM HCL 25 MG/5ML IV SOLN
INTRAVENOUS | Status: AC
  Filled 2023-01-09: qty 5

## 2023-01-09 MED ORDER — NITROGLYCERIN 0.4 MG SL SUBL
SUBLINGUAL_TABLET | SUBLINGUAL | Status: AC
  Filled 2023-01-09: qty 2

## 2023-01-09 MED ORDER — NITROGLYCERIN 0.4 MG SL SUBL
0.8000 mg | SUBLINGUAL_TABLET | Freq: Once | SUBLINGUAL | Status: AC
  Administered 2023-01-09: 0.8 mg via SUBLINGUAL

## 2023-01-09 MED ORDER — IOHEXOL 350 MG/ML SOLN
100.0000 mL | Freq: Once | INTRAVENOUS | Status: AC | PRN
  Administered 2023-01-09: 100 mL via INTRAVENOUS

## 2023-01-09 MED ORDER — DILTIAZEM HCL 25 MG/5ML IV SOLN
10.0000 mg | Freq: Once | INTRAVENOUS | Status: AC
  Administered 2023-01-09: 10 mg via INTRAVENOUS

## 2023-01-09 NOTE — Progress Notes (Signed)
0802 Pt arrived and asked what he was having done today and patient stated "I don't know whatever is on the paper".  Patient asked if anyone contacted him prior to today in regards to scan and he stated "no".  Upon further review of chart, it was noted that VM was left for patient. Patient stated "oh yeah but it said that it was a reminder about tomorrow and no need to call back".  Patient stated that he did not take his medication prior to arrival today due to "the paper saying not to eat or drink anything and the bottles say to take with food, so I didn't know".  0808 Contacted cardiac navigator via secure chart to determine best course of action for patient due to history and not taking medications.  Clarise Cruz asked if reader had been contacted and I advised that they had not as of yet.    [place] stated that reader did not answer  0815 I advised Clarise Cruz that I was okay to proceed with scan at this time but wanted to ensure that due to history it was not contraindicated by any means not found in chart due to history and would attempt to get heart rate down with cardizem due to hx of copd.  Attempting to move forward with scan at this time.

## 2023-01-10 ENCOUNTER — Telehealth: Payer: Self-pay | Admitting: Internal Medicine

## 2023-01-10 NOTE — Telephone Encounter (Signed)
    Pt is returning call to get CT result 

## 2023-01-13 NOTE — Telephone Encounter (Signed)
Left a detailed message with results and MD comments ok per DPR.  Advised to call in with questions/ concerns.

## 2023-01-21 ENCOUNTER — Encounter: Payer: Self-pay | Admitting: Gastroenterology

## 2023-01-21 ENCOUNTER — Ambulatory Visit (AMBULATORY_SURGERY_CENTER): Payer: Medicaid Other | Admitting: Gastroenterology

## 2023-01-21 ENCOUNTER — Telehealth: Payer: Self-pay

## 2023-01-21 VITALS — BP 102/72 | HR 63 | Temp 96.9°F | Resp 20 | Ht 68.0 in | Wt 120.0 lb

## 2023-01-21 DIAGNOSIS — Z1211 Encounter for screening for malignant neoplasm of colon: Secondary | ICD-10-CM | POA: Diagnosis not present

## 2023-01-21 MED ORDER — SODIUM CHLORIDE 0.9 % IV SOLN: 500.0000 mL | INTRAVENOUS | Status: DC

## 2023-01-21 NOTE — Op Note (Addendum)
[facility] Patient Name: Steven Li Procedure Date: 01/21/2023 11:49 AM MRN: [id number] Endoscopist: Lorin Picket E. Tomasa Rand , MD, [phone] Age: 57 Referring MD:  Date of Birth: 06-23-1966 Gender: Male Account #: [id number] Procedure:                Colonoscopy Indications:              Screening for colorectal malignant neoplasm, This                            is the patient's first colonoscopy Medicines:                Monitored Anesthesia Care Procedure:                Pre-Anesthesia Assessment:                           - Prior to the procedure, a History and Physical                            was performed, and patient medications and                            allergies were reviewed. The patient's tolerance of                            previous anesthesia was also reviewed. The risks                            and benefits of the procedure and the sedation                            options and risks were discussed with the patient.                            All questions were answered, and informed consent                            was obtained. Prior Anticoagulants: The patient has                            taken no anticoagulant or antiplatelet agents. ASA                            Grade Assessment: II - A patient with mild systemic                            disease. After reviewing the risks and benefits,                            the patient was deemed in satisfactory condition to                            undergo the procedure.  After obtaining informed consent, the colonoscope                            was passed under direct vision. Throughout the                            procedure, the patient's blood pressure, pulse, and                            oxygen saturations were monitored continuously. The                            Olympus [id number]-[id number] (#[id number]) Colonoscope was                            introduced through the  anus and advanced to the the                            cecum, identified by appendiceal orifice and                            ileocecal valve. The colonoscopy was performed                            without difficulty. The patient tolerated the                            procedure well. The quality of the bowel                            preparation was good. The ileocecal valve,                            appendiceal orifice, and rectum were photographed.                            The bowel preparation used was GoLYTELY via split                            dose instruction. Scope In: 11:58:33 AM Scope Out: 12:13:27 PM Scope Withdrawal Time: 0 hours 10 minutes 1 second  Total Procedure Duration: 0 hours 14 minutes 54 seconds  Findings:                 The perianal and digital rectal examinations were                            normal. Pertinent negatives include normal                            sphincter tone and no palpable rectal lesions.                           The colon (entire examined portion) appeared normal.  Non-bleeding internal hemorrhoids were found during                            retroflexion. The hemorrhoids were large and Grade                            I (internal hemorrhoids that do not prolapse).                           No additional abnormalities were found on                            retroflexion. Complications:            No immediate complications. Estimated Blood Loss:     Estimated blood loss: none. Impression:               - The entire examined colon is normal.                           - Non-bleeding internal hemorrhoids.                           - No specimens collected.                           - The GI Genius (intelligent endoscopy module),                            computer-aided polyp detection system powered by AI                            was utilized to detect colorectal polyps through                             enhanced visualization during colonoscopy. Recommendation:           - Patient has a contact number available for                            emergencies. The signs and symptoms of potential                            delayed complications were discussed with the                            patient. Return to normal activities tomorrow.                            Written discharge instructions were provided to the                            patient.                           - Resume previous diet.                           -  Continue present medications.                           - Repeat colonoscopy in 10 years for screening                            purposes.                           ADDENDUM: Following the procedure, we discussed                            hemorrhoid banding, and the patient desired to                            proceed with banding. Our office will call him to                            set up a banding appointment. Eriyonna Matsushita E. Tomasa Rand, MD 01/21/2023 12:20:15 PM This report has been signed electronically.

## 2023-01-21 NOTE — Progress Notes (Signed)
A and O x3. Report to RN. Tolerated MAC anesthesia well. 

## 2023-01-21 NOTE — Progress Notes (Signed)
Pt. states no medical or surgical changes since previsit or office visit. 

## 2023-01-21 NOTE — Patient Instructions (Signed)
Handout on hemorrhoids given to patient. Resume previous diet and continue present medications. Repeat colonoscopy in 10 years for surveillance!   YOU HAD AN ENDOSCOPIC PROCEDURE TODAY AT THE [facility]:   Refer to the procedure report that was given to you for any specific questions about what was found during the examination.  If the procedure report does not answer your questions, please call your gastroenterologist to clarify.  If you requested that your care partner not be given the details of your procedure findings, then the procedure report has been included in a sealed envelope for you to review at your convenience later.  YOU SHOULD EXPECT: Some feelings of bloating in the abdomen. Passage of more gas than usual.  Walking can help get rid of the air that was put into your GI tract during the procedure and reduce the bloating. If you had a lower endoscopy (such as a colonoscopy or flexible sigmoidoscopy) you may notice spotting of blood in your stool or on the toilet paper. If you underwent a bowel prep for your procedure, you may not have a normal bowel movement for a few days.  Please Note:  You might notice some irritation and congestion in your nose or some drainage.  This is from the oxygen used during your procedure.  There is no need for concern and it should clear up in a day or so.  SYMPTOMS TO REPORT IMMEDIATELY:  Following lower endoscopy (colonoscopy or flexible sigmoidoscopy):  Excessive amounts of blood in the stool  Significant tenderness or worsening of abdominal pains  Swelling of the abdomen that is new, acute  Fever of 100F or higher  For urgent or emergent issues, a gastroenterologist can be reached at any hour by calling [phone]. Do not use MyChart messaging for urgent concerns.    DIET:  We do recommend a small meal at first, but then you may proceed to your regular diet.  Drink plenty of fluids but you should avoid alcoholic beverages for  24 hours.  ACTIVITY:  You should plan to take it easy for the rest of today and you should NOT DRIVE or use heavy machinery until tomorrow (because of the sedation medicines used during the test).    FOLLOW UP: Our staff will call the number listed on your records the next business day following your procedure.  We will call around 7:15- 8:00 am to check on you and address any questions or concerns that you may have regarding the information given to you following your procedure. If we do not reach you, we will leave a message.     If any biopsies were taken you will be contacted by phone or by letter within the next 1-3 weeks.  Please call us at [phone] if you have not heard about the biopsies in 3 weeks.    SIGNATURES/CONFIDENTIALITY: You and/or your care partner have signed paperwork which will be entered into your electronic medical record.  These signatures attest to the fact that that the information above on your After Visit Summary has been reviewed and is understood.  Full responsibility of the confidentiality of this discharge information lies with you and/or your care-partner.  

## 2023-01-21 NOTE — Progress Notes (Signed)
[facility] History and Physical   Primary Care Physician:  [name], MD   Reason for Procedure:   Colon cancer screening  Plan:    Screening colonoscopy     HPI: Steven Li is a 57 y.o. male undergoing initial average risk screening colonoscopy.  He has no family history of colon cancer and no chronic GI symptoms.    Past Medical History:  Diagnosis Date   Chest pain    COPD (chronic obstructive pulmonary disease)    PRN inhaler   ETOH abuse    Headache    "a few/year" (04/17/2018)   High cholesterol    on meds   Hypertension    on meds   Migraine    "a few/year" (04/17/2018)==hx of (12/23/2022)    Past Surgical History:  Procedure Laterality Date   NO PAST SURGERIES      Prior to Admission medications   Medication Sig Start Date End Date Taking? Authorizing Provider  albuterol (VENTOLIN HFA) 108 (90 Base) MCG/ACT inhaler Inhale 1-2 puffs into the lungs every 6 (six) hours as needed for wheezing or shortness of breath. 11/27/22  Yes Newlin, Enobong, MD  amLODipine (NORVASC) 10 MG tablet Take 1 tablet (10 mg total) by mouth daily. 11/27/22  Yes [name], MD  aspirin EC 81 MG tablet Take 81 mg by mouth daily. Swallow whole.   Yes [provider]  atorvastatin (LIPITOR) 20 MG tablet Take 1 tablet (20 mg total) by mouth daily. 11/27/22  Yes [name], MD  carvedilol (COREG) 6.25 MG tablet Take 1 tablet (6.25 mg total) by mouth 2 (two) times daily. 12/27/22  Yes Chandrasekhar, Mahesh A, MD  DULoxetine (CYMBALTA) 60 MG capsule Take 1 capsule (60 mg total) by mouth daily. For pain 11/27/22  Yes [name], MD  gabapentin (NEURONTIN) 300 MG capsule Take 1 capsule (300 mg total) by mouth at bedtime. Patient not taking: Reported on 01/21/2023 11/27/22   [name], MD  ondansetron (ZOFRAN) 4 MG tablet Take 1 tablet (4 mg total) by mouth daily as needed for nausea or vomiting. 01/02/23   Elson Areas, PA-C    Current Outpatient  Medications  Medication Sig Dispense Refill   albuterol (VENTOLIN HFA) 108 (90 Base) MCG/ACT inhaler Inhale 1-2 puffs into the lungs every 6 (six) hours as needed for wheezing or shortness of breath. 18 g 3   amLODipine (NORVASC) 10 MG tablet Take 1 tablet (10 mg total) by mouth daily. 90 tablet 1   aspirin EC 81 MG tablet Take 81 mg by mouth daily. Swallow whole.     atorvastatin (LIPITOR) 20 MG tablet Take 1 tablet (20 mg total) by mouth daily. 90 tablet 1   carvedilol (COREG) 6.25 MG tablet Take 1 tablet (6.25 mg total) by mouth 2 (two) times daily. 180 tablet 3   DULoxetine (CYMBALTA) 60 MG capsule Take 1 capsule (60 mg total) by mouth daily. For pain 90 capsule 1   gabapentin (NEURONTIN) 300 MG capsule Take 1 capsule (300 mg total) by mouth at bedtime. (Patient not taking: Reported on 01/21/2023) 90 capsule 1   ondansetron (ZOFRAN) 4 MG tablet Take 1 tablet (4 mg total) by mouth daily as needed for nausea or vomiting. 12 tablet 1   Current Facility-Administered Medications  Medication Dose Route Frequency Provider Last Rate Last Admin   0.9 %  sodium chloride infusion  500 mL Intravenous Continuous Jenel Lucks, MD        Allergies as of  01/21/2023   (No Known Allergies)    Family History  Problem Relation Age of Onset   Cirrhosis Mother        alcoholic   Cirrhosis Father        alcoholic    Diabetes Sister    Colon polyps Neg Hx    Colon cancer Neg Hx    Esophageal cancer Neg Hx    Rectal cancer Neg Hx    Stomach cancer Neg Hx     Social History   Socioeconomic History   Marital status: Divorced    Spouse name: Not on file   Number of children: Not on file   Years of education: Not on file   Highest education level: Not on file  Occupational History   Not on file  Tobacco Use   Smoking status: Every Day    Packs/day: 0.50    Years: 33.00    Additional pack years: 0.00    Total pack years: 16.50    Types: Cigarettes   Smokeless tobacco: Never  Vaping  Use   Vaping Use: Never used  Substance and Sexual Activity   Alcohol use: Yes    Alcohol/week: 42.0 standard drinks of alcohol    Types: 42 Cans of beer per week    Comment: 3 beers every Friday   Drug use: Yes    Frequency: 7.0 times per week    Types: Marijuana    Comment: 12/23/2022   Sexual activity: Not Currently  Other Topics Concern   Not on file  Social History Narrative   Not on file   Social Determinants of Health   Financial Resource Strain: Not on file  Food Insecurity: Not on file  Transportation Needs: Not on file  Physical Activity: Not on file  Stress: Not on file  Social Connections: Not on file  Intimate Partner Violence: Not on file    Review of Systems:  All other review of systems negative except as mentioned in the HPI.  Physical Exam: Vital signs BP 111/63   Pulse (!) 57   Temp (!) 96.9 F (36.1 C) (Skin)   Ht  (1.727 m)   Wt 120 lb (54.4 kg)   SpO2 99%   BMI 18.25 kg/m   General:   Alert,  Well-developed, well-nourished, pleasant and cooperative in NAD Airway:  Mallampati 2 Lungs:  Clear throughout to auscultation.   Heart:  Regular rate and rhythm; no murmurs, clicks, rubs,  or gallops. Abdomen:  Soft, nontender and nondistended. Normal bowel sounds.   Neuro/Psych:  Normal mood and affect. A and O x 3   Hung Rhinesmith E. Tomasa Rand, MD [facility]

## 2023-01-21 NOTE — Telephone Encounter (Signed)
Left VM for patient letting him know that he has been scheduled for Hemorrhoid banding 5./13 @ 11:10 am and to please call back to reschedule if that day does not work for him.

## 2023-01-22 ENCOUNTER — Telehealth: Payer: Self-pay | Admitting: *Deleted

## 2023-01-22 NOTE — Telephone Encounter (Signed)
No answer on  follow up call. Left message.   

## 2023-01-24 NOTE — Telephone Encounter (Signed)
Left VM for patient to return call. Looks like patient is scheduled for 02/17/23 and 02/24/23.

## 2023-02-11 ENCOUNTER — Other Ambulatory Visit: Payer: Self-pay

## 2023-02-17 ENCOUNTER — Encounter: Payer: Self-pay | Admitting: Gastroenterology

## 2023-02-17 ENCOUNTER — Ambulatory Visit (INDEPENDENT_AMBULATORY_CARE_PROVIDER_SITE_OTHER): Payer: Medicaid Other | Admitting: Gastroenterology

## 2023-02-17 VITALS — BP 130/80 | HR 84 | Ht 68.0 in | Wt 122.5 lb

## 2023-02-17 DIAGNOSIS — K642 Third degree hemorrhoids: Secondary | ICD-10-CM | POA: Diagnosis not present

## 2023-02-17 NOTE — Patient Instructions (Signed)
_______________________________________________________  If your blood pressure at your visit was 140/90 or greater, please contact your primary care physician to follow up on this.  _______________________________________________________  If you are age 59 or older, your body mass index should be between 23-30. Your Body mass index is 18.63 kg/m. If this is out of the aforementioned range listed, please consider follow up with your Primary Care Provider.  If you are age 50 or younger, your body mass index should be between 19-25. Your Body mass index is 18.63 kg/m. If this is out of the aformentioned range listed, please consider follow up with your Primary Care Provider.   ________________________________________________________  The [facility] providers would like to encourage you to use [facility] to communicate with providers for non-urgent requests or questions.  Due to long hold times on the telephone, sending your provider a message by [facility] may be a faster and more efficient way to get a response.  Please allow 48 business hours for a response.  Please remember that this is for non-urgent requests.  _______________________________________________________  [place] [facility] PROCEDURE    FOLLOW-UP CARE   The procedure you have had should have been relatively painless since the banding of the area involved does not have nerve endings and there is no pain sensation.  The rubber band cuts off the blood supply to the hemorrhoid and the band may fall off as soon as 48 hours after the banding (the band may occasionally be seen in the toilet bowl following a bowel movement). You may notice a temporary feeling of fullness in the rectum which should respond adequately to plain Tylenol or Motrin.  Following the banding, avoid strenuous exercise that evening and resume full activity the next day.  A sitz bath (soaking in a warm tub) or bidet is soothing, and can be useful for cleansing the area  after bowel movements.     To avoid constipation, take two tablespoons of natural wheat bran, natural oat bran, flax, Benefiber or any over the counter fiber supplement and increase your water intake to 7-8 glasses daily.    Unless you have been prescribed anorectal medication, do not put anything inside your rectum for two weeks: No suppositories, enemas, fingers, etc.  Occasionally, you may have more bleeding than usual after the banding procedure.  This is often from the untreated hemorrhoids rather than the treated one.  Don't be concerned if there is a tablespoon or so of blood.  If there is more blood than this, lie flat with your bottom higher than your head and apply an ice pack to the area. If the bleeding does not stop within a half an hour or if you feel faint, call our office at [phone] or go to the emergency room.  Problems are not common; however, if there is a substantial amount of bleeding, severe pain, chills, fever or difficulty passing urine (very rare) or other problems, you should call us at [phone] or report to the nearest emergency room.  Do not stay seated continuously for more than 2-3 hours for a day or two after the procedure.  Tighten your buttock muscles 10-15 times every two hours and take 10-15 deep breaths every 1-2 hours.  Do not spend more than a few minutes on the toilet if you cannot empty your bowel; instead re-visit the toilet at a later time.

## 2023-02-17 NOTE — Progress Notes (Signed)
PROCEDURE NOTE: The patient presents with symptomatic grade 3  hemorrhoids, requesting rubber band ligation of his/her hemorrhoidal disease.  All risks, benefits and alternative forms of therapy were described and informed consent was obtained.  The patient reports chronic hemorrhoid symptoms to include frequent bright red blood per rectum as well as prolapsing hemorrhoids that have to be manually reduced.  A recent colonoscopy demonstrated the presence of large internal hemorrhoids.  The anorectum was pre-medicated with topical lidocaine (5%) and nitroglycerin (0.125%) The decision was made to band the left lateral internal hemorrhoid, and the [facility] System was used to perform band ligation without complication.  Digital anorectal examination was then performed to assure proper positioning of the band, and to adjust the banded tissue as required.  The patient was discharged home without pain or other issues.  Dietary and behavioral recommendations were given and along with follow-up instructions.     The following adjunctive treatments were recommended:  Fiber supplementation Adequate water intake Avoidance of straining and hard stools  The patient will return in 2-4 weeks for  follow-up and possible additional banding as required. No complications were encountered and the patient tolerated the procedure well.

## 2023-02-24 ENCOUNTER — Encounter: Payer: Medicaid Other | Admitting: Gastroenterology

## 2023-03-14 ENCOUNTER — Encounter: Payer: Medicaid Other | Admitting: Gastroenterology

## 2023-03-14 ENCOUNTER — Other Ambulatory Visit: Payer: Self-pay

## 2023-03-17 ENCOUNTER — Other Ambulatory Visit: Payer: Self-pay

## 2023-03-26 ENCOUNTER — Other Ambulatory Visit (HOSPITAL_COMMUNITY): Payer: Self-pay

## 2023-03-26 MED ORDER — AMOXICILLIN 500 MG PO TABS
500.0000 mg | ORAL_TABLET | Freq: Three times a day (TID) | ORAL | 0 refills | Status: DC
  Filled 2023-03-26 – 2023-03-28 (×2): qty 18, 6d supply, fill #0

## 2023-03-28 ENCOUNTER — Other Ambulatory Visit: Payer: Self-pay

## 2023-03-28 ENCOUNTER — Other Ambulatory Visit (HOSPITAL_COMMUNITY): Payer: Self-pay

## 2023-03-30 NOTE — Progress Notes (Unsigned)
Office Visit    Patient Name: Steven Li Date of Encounter: 03/30/2023  Primary Care Provider:  [name], MD Primary Cardiologist:  Christell Constant, MD Primary Electrophysiologist: None   Past Medical History    Past Medical History:  Diagnosis Date   Chest pain    COPD (chronic obstructive pulmonary disease) (HCC)    PRN inhaler   ETOH abuse    Headache    "a few/year" (04/17/2018)   High cholesterol    on meds   Hypertension    on meds   Migraine    "a few/year" (04/17/2018)==hx of (12/23/2022)   Past Surgical History:  Procedure Laterality Date   NO PAST SURGERIES      Allergies  No Known Allergies   History of Present Illness    Steven Li  is a 56 year old male with a PMH of nonobstructive atherosclerosis, aortic atherosclerosis, EtOH abuse with GI bleed, tobacco abuse with COPD, pancreatitis who Li today for 25-month follow-up.  He was seen 12/26/2022 by Dr. Raynelle Jan for complaint of chest pain.  He endorsed tachycardia with walking prolonged distances.  He denied any exertional chest pain.  He was noted to smoke 30 pack/year with 1 joint per day.  He completed a previous 2D echo in 2017 that showed EF of 50% with inferior septal/inferior/inferior lateral/anterior septal hypokinesis.  He had a previous CT of the chest that showed aortic atherosclerosis and RCA calcification.  He was sent for a cardiac CTA that showed calcium score of 54.5 with normal ascending aorta and mild calcification in the RCA, mild calcification in the OM1.  He was also started on coreg and was continued on amlodipine 10 mg for BP.  Steven Li today for 73-month follow-up.  Since last being seen in the office patient reports that he has been doing well with no complaints of chest pain and has stopped drinking alcohol.  He is still smoking 1 pack per 2 days.  He is interested in receiving information about further assisting in cessation.  He is still active  with walking and denies any cardiac complaints with this activity.  During today's visit we reviewed his test results and all questions were answered to patient's satisfaction.  Patient denies chest pain, palpitations, dyspnea, PND, orthopnea, nausea, vomiting, dizziness, syncope, edema, weight gain, or early satiety.   Home Medications    Current Outpatient Medications  Medication Sig Dispense Refill   albuterol (VENTOLIN HFA) 108 (90 Base) MCG/ACT inhaler Inhale 1-2 puffs into the lungs every 6 (six) hours as needed for wheezing or shortness of breath. 18 g 3   amLODipine (NORVASC) 10 MG tablet Take 1 tablet (10 mg total) by mouth daily. 90 tablet 1   amoxicillin (AMOXIL) 500 MG tablet Take 1 tablet (500 mg total) by mouth 3 (three) times daily. 18 tablet 0   aspirin EC 81 MG tablet Take 81 mg by mouth daily. Swallow whole.     atorvastatin (LIPITOR) 20 MG tablet Take 1 tablet (20 mg total) by mouth daily. 90 tablet 1   carvedilol (COREG) 6.25 MG tablet Take 1 tablet (6.25 mg total) by mouth 2 (two) times daily. 180 tablet 3   DULoxetine (CYMBALTA) 60 MG capsule Take 1 capsule (60 mg total) by mouth daily. For pain 90 capsule 1   gabapentin (NEURONTIN) 300 MG capsule Take 1 capsule (300 mg total) by mouth at bedtime. (Patient not taking: Reported on 01/21/2023) 90 capsule 1  ondansetron (ZOFRAN) 4 MG tablet Take 1 tablet (4 mg total) by mouth daily as needed for nausea or vomiting. 12 tablet 1   No current facility-administered medications for this visit.     Review of Systems  Please see the history of present illness.     All other systems reviewed and are otherwise negative except as noted above.  Physical Exam    Wt Readings from Last 3 Encounters:  02/17/23 122 lb 8 oz (55.6 kg)  01/21/23 120 lb (54.4 kg)  01/02/23 120 lb (54.4 kg)   WU:JWJXB were no vitals filed for this visit.,There is no height or weight on file to calculate BMI.  Constitutional:      Appearance: Healthy  appearance. Not in distress.  Neck:     Vascular: JVD normal.  Pulmonary:     Effort: Pulmonary effort is normal.     Breath sounds: No wheezing. No rales. Diminished in the bases Cardiovascular:     Normal rate. Regular rhythm. Normal S1. Normal S2.      Murmurs: There is no murmur.  Edema:    Peripheral edema absent.  Abdominal:     Palpations: Abdomen is soft non tender. There is no hepatomegaly.  Skin:    General: Skin is warm and dry.  Neurological:     General: No focal deficit present.     Mental Status: Alert and oriented to person, place and time.     Cranial Nerves: Cranial nerves are intact.  EKG/LABS/ Recent Cardiac Studies    ECG personally reviewed by me today -none completed today  Cardiac Studies & Procedures       ECHOCARDIOGRAM  ECHOCARDIOGRAM COMPLETE 10/04/2016  Narrative *Steven Li* *[facility]* [address] [place], [place] [address] [phone]  ------------------------------------------------------------------- Transthoracic Echocardiography  Patient:    Steven Li, Steven Li MR #:       [id number] Study Date: 10/04/2016 Gender:     M Age:        50 Height:     172.7 cm Weight:     50.4 kg BSA:        1.54 m^2 Pt. Status: Room:       1224  ADMITTING    Tacy Learn REFERRING    Standley Brooking PERFORMING   [facility], Inpatient SONOGRAPHER  Leta Jungling, RDCS ATTENDING    Debbra Riding U  cc:  ------------------------------------------------------------------- LV EF: 50%  ------------------------------------------------------------------- Indications:      Abnormal blood test 790.99.  ------------------------------------------------------------------- History:   PMH:  Elevated Troponin. ETOH Abuse. Chest Pain precordial. Abdominal Pain.  Risk factors:  Current tobacco use. Hypertension.  ------------------------------------------------------------------- Study  Conclusions  - Left ventricle: The cavity size was normal. Wall thickness was normal. Unusual wall motion abnormality pattern with basal inferoseptal, basal inferior, basal inferolateral, and basal anterolateral severe hypokinesis. The estimated ejection fraction was 50%. Features are consistent with a pseudonormal left ventricular filling pattern, with concomitant abnormal relaxation and increased filling pressure (grade 2 diastolic dysfunction). - Aortic valve: There was no stenosis. - Mitral valve: There was mild regurgitation. - Right ventricle: The cavity size was normal. Systolic function was normal. - Right atrium: Possible right atrial mass noted in subcostal view but appears most likely to be infolding of wall/prominent Eustachian ridge. - Tricuspid valve: Peak RV-RA gradient (S): 36 mm Hg. - Pulmonary arteries: PA peak pressure: 44 mm Hg (S). - Systemic veins: IVC measured 2.0 cm with <  50% respirophasic variation, suggesting RA pressure 8 mmHg.  Impressions:  - Normal LV size with EF 50%. Basal inferoseptal/inferior/inferolateral/anterolateral severe hypokinesis (unusual pattern). Moderate diastolic dysfunction. Normal RV size and systolic function. Mild pulmonary hypertension.  ------------------------------------------------------------------- Study data:  No prior study was available for comparison.  Study status:  Routine.  Procedure:  The patient reported no pain pre or post test. Transthoracic echocardiography. Image quality was adequate.  Study completion:  There were no complications. Transthoracic echocardiography.  M-mode, complete 2D, spectral Doppler, and color Doppler.  Birthdate:  Patient birthdate: 03/02/1966.  Age:  Patient is 57 yr old.  Sex:  Gender: male. BMI: 16.9 kg/m^2.  Blood pressure:     132/98  Patient status: Inpatient.  Study date:  Study date: 10/04/2016. Study time: 08:55 AM.  Location:   ICU/CCU  -------------------------------------------------------------------  ------------------------------------------------------------------- Left ventricle:  The cavity size was normal. Wall thickness was normal. Unusual wall motion abnormality pattern with basal inferoseptal, basal inferior, basal inferolateral, and basal anterolateral severe hypokinesis. The estimated ejection fraction was 50%. Features are consistent with a pseudonormal left ventricular filling pattern, with concomitant abnormal relaxation and increased filling pressure (grade 2 diastolic dysfunction).  ------------------------------------------------------------------- Aortic valve:   Trileaflet.  Doppler:   There was no stenosis. There was no regurgitation.  ------------------------------------------------------------------- Aorta:  Aortic root: The aortic root was normal in size. Ascending aorta: The ascending aorta was normal in size.  ------------------------------------------------------------------- Mitral valve:   Normal thickness leaflets .  Doppler:   There was no evidence for stenosis.   There was mild regurgitation.    Peak gradient (D): 5 mm Hg.  ------------------------------------------------------------------- Left atrium:  The atrium was normal in size.  ------------------------------------------------------------------- Right ventricle:  The cavity size was normal. Systolic function was normal.  ------------------------------------------------------------------- Pulmonic valve:    Structurally normal valve.   Cusp separation was normal.  Doppler:  Transvalvular velocity was within the normal range. There was trivial regurgitation.  ------------------------------------------------------------------- Tricuspid valve:   Doppler:  There was mild regurgitation.  ------------------------------------------------------------------- Right atrium:  The atrium was normal in size. Possible  right atrial mass noted in subcostal view but appears most likely to be infolding of wall/prominent Eustachian ridge.  ------------------------------------------------------------------- Pericardium:  There was no pericardial effusion.  ------------------------------------------------------------------- Systemic veins:  IVC measured 2.0 cm with < 50% respirophasic variation, suggesting RA pressure 8 mmHg.  ------------------------------------------------------------------- Measurements  Left ventricle                         Value        Reference LV ID, ED, PLAX chordal                47    mm     43 - 52 LV ID, ES, PLAX chordal        (H)     41    mm     23 - 38 LV fx shortening, PLAX chordal (L)     13    %      >=29 LV PW thickness, ED                    9.39  mm     --------- IVS/LV PW ratio, ED                    1.08         <=1.3 LV e&', lateral  13.2  cm/s   --------- LV E/e&', lateral                       8.64         --------- LV e&', medial                          8.27  cm/s   --------- LV E/e&', medial                        13.78        --------- LV e&', average                         10.74 cm/s   --------- LV E/e&', average                       10.62        ---------  Ventricular septum                     Value        Reference IVS thickness, ED                      10.1  mm     ---------  LVOT                                   Value        Reference LVOT ID, S                             24    mm     --------- LVOT area                              4.52  cm^2   ---------  Aorta                                  Value        Reference Aortic root ID, ED                     30    mm     ---------  Left atrium                            Value        Reference LA ID, A-P, ES                         30    mm     --------- LA ID/bsa, A-P                         1.95  cm/m^2 <=2.2 LA volume, S                           41.6  ml      --------- LA volume/bsa, S  27    ml/m^2 --------- LA volume, ES, 1-p A4C                 40.8  ml     --------- LA volume/bsa, ES, 1-p A4C             26.5  ml/m^2 --------- LA volume, ES, 1-p A2C                 42.1  ml     --------- LA volume/bsa, ES, 1-p A2C             27.3  ml/m^2 ---------  Mitral valve                           Value        Reference Mitral E-wave peak velocity            114   cm/s   --------- Mitral A-wave peak velocity            78.6  cm/s   --------- Mitral deceleration time               151   ms     150 - 230 Mitral peak gradient, D                5     mm Hg  --------- Mitral E/A ratio, peak                 1.5          --------- Mitral regurg VTI, PISA                175   cm     --------- Mitral ERO, PISA                       0.18  cm^2   --------- Mitral regurg volume, PISA             32    ml     ---------  Pulmonary arteries                     Value        Reference PA pressure, S, DP             (H)     44    mm Hg  <=30  Tricuspid valve                        Value        Reference Tricuspid regurg peak velocity         301   cm/s   --------- Tricuspid peak RV-RA gradient          36    mm Hg  ---------  Right ventricle                        Value        Reference RV s&', lateral, S                      12.2  cm/s   ---------  Pulmonic valve                         Value        Reference Pulmonic regurg velocity,  ED           136   cm/s   --------- Pulmonic regurg gradient, ED           7     mm Hg  ---------  Legend: (L)  and  (H)  mark values outside specified reference range.  ------------------------------------------------------------------- Prepared and Electronically Authenticated by  Marca Ancona, M.D. 2017-12-29T16:44:19     CT SCANS  CT CORONARY MORPH W/CTA COR W/SCORE 01/11/2023  Addendum 01/11/2023 12:02 PM ADDENDUM REPORT: 01/11/2023 12:00  EXAM: OVER-READ INTERPRETATION  CT CHEST  The  following report is an over-read performed by radiologist Dr. Royal P[facility]ra [facility] on 01/11/2023. This over-read does not include interpretation of cardiac or coronary anatomy or pathology. The coronary calcium score and cardiac CTA interpretation by the cardiologist is attached.  COMPARISON:  None.  FINDINGS: Atherosclerotic calcifications in the thoracic aorta. Within the visualized portions of the thorax there are no suspicious appearing pulmonary nodules or masses, there is no acute consolidative airspace disease, no pleural effusions, no pneumothorax and no lymphadenopathy. Visualized portions of the upper abdomen are unremarkable. There are no aggressive appearing lytic or blastic lesions noted in the visualized portions of the skeleton.  IMPRESSION: 1.  Aortic Atherosclerosis (ICD10-I70.0).   Electronically Signed By: Trudie Reed M.D. On: 01/11/2023 12:00  Narrative CLINICAL DATA:  Chest pain  EXAM: Cardiac CTA  MEDICATIONS: Sub lingual nitro. 4mg  and 2.5 bisoprolol and 6.125 bid coreg  TECHNIQUE: The patient was scanned on a Siemens Force 192 slice scanner. Gantry rotation speed was 250 msecs. Collimation was .6 mm. A 120 kV prospective scan was triggered in the ascending thoracic aorta at 140 HU's Full mA was used between 35% and 75% of the R-R interval. Average HR during the scan was 55 bpm. The 3D data set was interpreted on a dedicated work station using MPR, MIP and VRT modes. A total of 80 cc of contrast was used.  FINDINGS: Non-cardiac: See separate report from [facility]. No significant findings on limited lung and soft tissue windows.  Calcium Score: MIld calcium noted in RCA and LCX  LM 0  RCA 48.1  LCX 6.41  LAD 0  Total 54.5  Coronary Arteries: Right dominant with no anomalies  LM: Normal  LAD:  Normal  D1: Normal  D2: Normal  D3: Normal  Circumflex: Normal  OM1: 1-24% calcified plaque in  mid vessel  OM2: Normal  RCA: 1-24% calcified plaque in mid vessel 25-49% calcified plaque in distal vessel  PDA: Normal  PLA: Normal  IMPRESSION: 1. Calcium score 54.5 which is 81 st percentile for age/sex  2.  Normal ascending thoracic aorta 3.5 cm  3.  CAD RADS 2 non obstructive CAD see description above  Charlton Haws  Electronically Signed: By: Charlton Haws M.D. On: 01/09/2023 09:01           Lab Results  Component Value Date   WBC 6.9 01/02/2023   HGB 15.8 01/02/2023   HCT 43.7 01/02/2023   MCV 90.9 01/02/2023   PLT 277 01/02/2023   Lab Results  Component Value Date   CREATININE 1.25 (H) 01/02/2023   BUN 14 01/02/2023   NA 132 (L) 01/02/2023   K 4.0 01/02/2023   CL 93 (L) 01/02/2023   CO2 24 01/02/2023   Lab Results  Component Value Date   ALT 30 01/02/2023   AST 65 (H) 01/02/2023   ALKPHOS 74 01/02/2023   BILITOT 1.3 (H) 01/02/2023  Lab Results  Component Value Date   CHOL 213 (H) 11/27/2022   HDL 135 11/27/2022   LDLCALC 53 11/27/2022   TRIG 162 (H) 11/27/2022   CHOLHDL 1.8 05/15/2020    Lab Results  Component Value Date   HGBA1C 5.2 11/27/2021     Assessment & Plan    1.  Chest pain: -Today patient reports no recurrence of chest pain since his previous visit. -He was advised to follow-up with our office if he has any recurrence that is not relieved with rest.  2.  Nonobstructive CAD: -s/p cardiac CTA with minimal obstructive disease noted. -Continue GDMT with Lipitor 20 mg, aspirin 81 mg, and carvedilol 6.25 mg twice daily  3.  Essential hypertension: -Patient's blood pressure today is well-controlled at 126/62 -Continue Norvasc 10 mg daily and Lipitor 6.25 mg twice daily  4.  Mixed hyperlipidemia: -Patient's last LDL cholesterol was 53 -We will recheck lipids and LFTs today -Continue Lipitor 20 mg daily  5.  COPD: -Patient reports compliance with Ventolin and denies any episodes of shortness of breath. -Continue current  treatment plan per PCP   Disposition: Follow-up with Christell Constant, MD as needed  Medication Adjustments/Labs and Tests Ordered: Current medicines are reviewed at length with the patient today.  Concerns regarding medicines are outlined above.   Signed, Napoleon Form, Leodis Rains, NP 03/30/2023, 1:17 PM [facility]

## 2023-03-31 ENCOUNTER — Other Ambulatory Visit: Payer: Self-pay

## 2023-03-31 ENCOUNTER — Ambulatory Visit: Payer: Medicaid Other | Attending: Nurse Practitioner | Admitting: Nurse Practitioner

## 2023-03-31 ENCOUNTER — Other Ambulatory Visit (HOSPITAL_COMMUNITY): Payer: Self-pay

## 2023-03-31 ENCOUNTER — Encounter: Payer: Self-pay | Admitting: Nurse Practitioner

## 2023-03-31 VITALS — BP 126/62 | HR 69 | Ht 68.0 in | Wt 125.4 lb

## 2023-03-31 DIAGNOSIS — E782 Mixed hyperlipidemia: Secondary | ICD-10-CM | POA: Diagnosis not present

## 2023-03-31 DIAGNOSIS — I10 Essential (primary) hypertension: Secondary | ICD-10-CM

## 2023-03-31 DIAGNOSIS — F1020 Alcohol dependence, uncomplicated: Secondary | ICD-10-CM

## 2023-03-31 DIAGNOSIS — R079 Chest pain, unspecified: Secondary | ICD-10-CM | POA: Diagnosis not present

## 2023-03-31 DIAGNOSIS — I2510 Atherosclerotic heart disease of native coronary artery without angina pectoris: Secondary | ICD-10-CM | POA: Diagnosis not present

## 2023-03-31 DIAGNOSIS — J449 Chronic obstructive pulmonary disease, unspecified: Secondary | ICD-10-CM

## 2023-03-31 MED ORDER — AMLODIPINE BESYLATE 10 MG PO TABS
10.0000 mg | ORAL_TABLET | Freq: Every day | ORAL | 3 refills | Status: DC
  Filled 2023-03-31: qty 90, 90d supply, fill #0

## 2023-03-31 MED ORDER — AMOXICILLIN 500 MG PO CAPS
500.0000 mg | ORAL_CAPSULE | Freq: Three times a day (TID) | ORAL | 0 refills | Status: DC
  Filled 2023-03-31: qty 18, 6d supply, fill #0

## 2023-03-31 NOTE — Patient Instructions (Addendum)
Medication Instructions:  Your physician recommends that you continue on your current medications as directed. Please refer to the Current Medication list given to you today. *If you need a refill on your cardiac medications before your next appointment, please call your pharmacy*   Lab Work: TODAY- LFT's & LIPIDS If you have labs (blood work) drawn today and your tests are completely normal, you will receive your results only by: MyChart Message (if you have MyChart) OR A paper copy in the mail If you have any lab test that is abnormal or we need to change your treatment, we will call you to review the results.   Testing/Procedures: NONE ORDERED   Follow-Up: At [facility], you and your health needs are our priority.  As part of our continuing mission to provide you with exceptional heart care, we have created designated Provider Care Teams.  These Care Teams include your primary Cardiologist (physician) and Advanced Practice Providers (APPs -  Physician Assistants and Nurse Practitioners) who all work together to provide you with the care you need, when you need it.  We recommend signing up for the patient portal called "MyChart".  Sign up information is provided on this After Visit Summary.  MyChart is used to connect with patients for Virtual Visits (Telemedicine).  Patients are able to view lab/test results, encounter notes, upcoming appointments, etc.  Non-urgent messages can be sent to your provider as well.   To learn more about what you can do with MyChart, go to [email or web address].    Your next appointment:    As NEEDED  Provider:   Christell Constant, MD     Other Instructions  [phone]

## 2023-04-01 LAB — HEPATIC FUNCTION PANEL
ALT: 27 IU/L (ref 0–44)
AST: 29 IU/L (ref 0–40)
Albumin: 4.4 g/dL (ref 3.8–4.9)
Alkaline Phosphatase: 87 IU/L (ref 44–121)
Bilirubin Total: 0.2 mg/dL (ref 0.0–1.2)
Bilirubin, Direct: <0.1 mg/dL (ref 0.00–0.40)
Total Protein: 7.2 g/dL (ref 6.0–8.5)

## 2023-04-01 LAB — LIPID PANEL
Chol/HDL Ratio: 2 ratio (ref 0.0–5.0)
Cholesterol, Total: 193 mg/dL (ref 100–199)
HDL: 98 mg/dL (ref >39)
LDL Chol Calc (NIH): 84 mg/dL (ref 0–99)
Triglycerides: 57 mg/dL (ref 0–149)
VLDL Cholesterol Cal: 11 mg/dL (ref 5–40)

## 2023-04-03 ENCOUNTER — Other Ambulatory Visit (HOSPITAL_COMMUNITY): Payer: Self-pay

## 2023-04-03 MED ORDER — IBUPROFEN 800 MG PO TABS
800.0000 mg | ORAL_TABLET | Freq: Four times a day (QID) | ORAL | 0 refills | Status: AC | PRN
  Filled 2023-04-03: qty 15, 4d supply, fill #0

## 2023-04-03 MED ORDER — TRAMADOL HCL 50 MG PO TABS
50.0000 mg | ORAL_TABLET | Freq: Four times a day (QID) | ORAL | 0 refills | Status: DC | PRN
  Filled 2023-04-03: qty 10, 3d supply, fill #0

## 2023-04-03 MED ORDER — AMOXICILLIN 500 MG PO CAPS
500.0000 mg | ORAL_CAPSULE | Freq: Three times a day (TID) | ORAL | 0 refills | Status: AC
  Filled 2023-04-03: qty 18, 6d supply, fill #0

## 2023-04-07 ENCOUNTER — Other Ambulatory Visit (HOSPITAL_COMMUNITY): Payer: Self-pay

## 2023-04-14 ENCOUNTER — Other Ambulatory Visit (HOSPITAL_COMMUNITY): Payer: Self-pay

## 2023-04-15 ENCOUNTER — Other Ambulatory Visit: Payer: Self-pay

## 2023-04-15 DIAGNOSIS — E7800 Pure hypercholesterolemia, unspecified: Secondary | ICD-10-CM

## 2023-04-15 DIAGNOSIS — E782 Mixed hyperlipidemia: Secondary | ICD-10-CM

## 2023-04-15 MED ORDER — ATORVASTATIN CALCIUM 40 MG PO TABS
40.0000 mg | ORAL_TABLET | Freq: Every day | ORAL | 3 refills | Status: DC
  Filled 2023-04-15: qty 30, 30d supply, fill #0

## 2023-04-15 NOTE — Addendum Note (Signed)
Addended by: Alveta Heimlich on: 04/15/2023 02:01 PM   Modules accepted: Orders

## 2023-04-15 NOTE — Progress Notes (Signed)
Rx(s) sent to pharmacy electronic.

## 2023-04-18 ENCOUNTER — Other Ambulatory Visit: Payer: Self-pay

## 2023-04-22 ENCOUNTER — Other Ambulatory Visit: Payer: Self-pay

## 2023-04-24 ENCOUNTER — Telehealth: Payer: Self-pay | Admitting: Internal Medicine

## 2023-04-24 NOTE — Telephone Encounter (Signed)
Patient called to follow-up on test results. 

## 2023-04-24 NOTE — Telephone Encounter (Signed)
Returned call to patient and discussed lab results.  Per Robin Searing, NP: Please let patient know that his cholesterol has improved however his LDL (bad cholesterol) are still not at goal of less than 70.  Your liver function has improved since making lifestyle adjustment with stopping EtOH and is normal.   Plan: -Please increase atorvastatin to 40 mg daily and have patient complete LFTs and lipids and 8 weeks.     Robin Searing, NP   Atorvastatin dosage already updated and sent to pharmacy. Lipid panel and LFTs ordered. Lab appt scheduled for 06/23/23. Patient verbalized understanding of the above and expressed appreciation for call.

## 2023-04-28 ENCOUNTER — Other Ambulatory Visit: Payer: Self-pay

## 2023-04-29 ENCOUNTER — Other Ambulatory Visit: Payer: Self-pay

## 2023-04-30 ENCOUNTER — Other Ambulatory Visit: Payer: Self-pay

## 2023-05-02 ENCOUNTER — Other Ambulatory Visit: Payer: Self-pay

## 2023-06-23 ENCOUNTER — Ambulatory Visit: Payer: Medicaid Other | Attending: Internal Medicine

## 2023-06-23 DIAGNOSIS — E782 Mixed hyperlipidemia: Secondary | ICD-10-CM

## 2023-06-24 LAB — HEPATIC FUNCTION PANEL
ALT: 19 IU/L (ref 0–44)
AST: 30 IU/L (ref 0–40)
Albumin: 4.6 g/dL (ref 3.8–4.9)
Alkaline Phosphatase: 101 IU/L (ref 44–121)
Bilirubin Total: 0.5 mg/dL (ref 0.0–1.2)
Bilirubin, Direct: 0.17 mg/dL (ref 0.00–0.40)
Total Protein: 6.9 g/dL (ref 6.0–8.5)

## 2023-06-24 LAB — LIPID PANEL
Chol/HDL Ratio: 1.5 ratio (ref 0.0–5.0)
Cholesterol, Total: 139 mg/dL (ref 100–199)
HDL: 94 mg/dL (ref >39)
LDL Chol Calc (NIH): 34 mg/dL (ref 0–99)
Triglycerides: 51 mg/dL (ref 0–149)
VLDL Cholesterol Cal: 11 mg/dL (ref 5–40)

## 2023-08-28 ENCOUNTER — Other Ambulatory Visit: Payer: Self-pay | Admitting: Nurse Practitioner

## 2023-08-28 DIAGNOSIS — E7800 Pure hypercholesterolemia, unspecified: Secondary | ICD-10-CM

## 2023-09-25 ENCOUNTER — Other Ambulatory Visit: Payer: Self-pay | Admitting: Family Medicine

## 2023-10-11 ENCOUNTER — Ambulatory Visit
Admission: EM | Admit: 2023-10-11 | Discharge: 2023-10-11 | Disposition: A | Discharge status: Home / Self Care | Payer: Medicaid Other | Attending: Internal Medicine | Admitting: Internal Medicine

## 2023-10-11 DIAGNOSIS — H1131 Conjunctival hemorrhage, right eye: Secondary | ICD-10-CM | POA: Diagnosis not present

## 2023-10-11 NOTE — ED Notes (Signed)
 Basic Color Vision Test: Pass.

## 2023-10-11 NOTE — ED Provider Notes (Signed)
 [facility]    CSN: [id number] Arrival date & time: 10/11/23  0807      History   Chief Complaint Chief Complaint  Patient presents with   Eye Problem    HPI Steven Li is a 58 y.o. male.   Patient presents today for evaluation of redness to his right eye that he woke with this morning.  He denies any trauma.  He reports some mild soreness but no pain.  He has not any watering or discharge from his eye.  He denies any vision changes.  He has not had any headache, nausea, vomiting.  The history is provided by the patient.  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   Essential hypertension 09/07/2018   Pancreatitis, alcoholic, acute 04/17/2018   Pancreatic pseudocyst 04/17/2018   Nicotine  dependence, cigarettes, uncomplicated 04/17/2018   Iron overload 10/28/2016   Hyperbilirubinemia 10/03/2016   Elevated troponin 10/03/2016   Anemia    Abnormal LFTs    Acute upper GI bleed    Encephalopathy, hepatic (HCC)    Anemia due to blood loss, acute    Hepatitis 10/01/2016   Alcohol abuse 10/01/2016   Aortic atherosclerosis (HCC) 10/01/2016   Transaminitis 09/30/2016   Colitis     Past Surgical History:  Procedure Laterality Date   NO PAST SURGERIES         Home Medications    Prior to Admission medications   Medication Sig Start Date End Date Taking? Authorizing Provider  amLODipine  (NORVASC ) 10 MG  tablet Take 1 tablet (10 mg total) by mouth daily. 03/31/23  Yes Wyn Jackee VEAR Mickey., NP  aspirin EC 81 MG tablet Take 81 mg by mouth daily. Swallow whole.   Yes [provider]  atorvastatin  (LIPITOR) 40 MG tablet TAKE 1 TABLET BY MOUTH EVERY DAY 08/28/23  Yes Wyn Jackee VEAR Mickey., NP  carvedilol  (COREG ) 6.25 MG tablet Take 1 tablet (6.25 mg total) by mouth 2 (two) times daily. 12/27/22  Yes Chandrasekhar, Mahesh A, MD  amoxicillin  (AMOXIL ) 500 MG capsule Take 1 capsule (500 mg total) by mouth 3 (three) times daily. 04/03/23     DULoxetine  (CYMBALTA ) 60 MG capsule Take 1 capsule (60 mg total) by mouth daily. For pain 11/27/22   Newlin, Enobong, MD  gabapentin  (NEURONTIN ) 300 MG capsule Take 1 capsule (300 mg total) by mouth at bedtime. 11/27/22   Newlin, Enobong, MD  ibuprofen  (ADVIL ) 800 MG tablet Take 1 tablet by mouth every six to eight hours as needed for pain 04/03/23     ondansetron  (ZOFRAN ) 4 MG tablet Take 1 tablet (4 mg total) by mouth daily as needed for nausea or vomiting. 01/02/23   Sofia, Leslie K, PA-C  traMADol  (ULTRAM ) 50 MG tablet Take 1 tablet (50 mg total) by mouth every 6 (six) hours as needed for pain 04/03/23  VENTOLIN  HFA 108 (90 Base) MCG/ACT inhaler INHALE 1-2 PUFFS INTO THE LUNGS EVERY 6 HOURS AS NEEDED FOR WHEEZING OR SHORTNESS OF BREATH 09/25/23   Delbert Clam, MD    Family History Family History  Problem Relation Age of Onset   Cirrhosis Mother        alcoholic   Cirrhosis Father        alcoholic    Diabetes Sister    Colon polyps Neg Hx    Colon cancer Neg Hx    Esophageal cancer Neg Hx    Rectal cancer Neg Hx    Stomach cancer Neg Hx     Social History Social History   Tobacco Use   Smoking status: Every Day    Current packs/day: 0.50    Average packs/day: 0.5 packs/day for 33.0 years (16.5 ttl pk-yrs)    Types: Cigarettes   Smokeless tobacco: Never  Vaping Use   Vaping status: Never Used  Substance Use Topics   Alcohol use: Yes     Alcohol/week: 42.0 standard drinks of alcohol    Types: 42 Cans of beer per week    Comment: 3 beers every Friday   Drug use: Yes    Frequency: 7.0 times per week    Types: Marijuana    Comment: 12/23/2022     Allergies   Patient has no known allergies.   Review of Systems Review of Systems  Constitutional:  Negative for chills and fever.  Eyes:  Positive for redness. Negative for photophobia, pain, discharge and visual disturbance.  Respiratory:  Negative for shortness of breath.   Skin:  Positive for wound.  Neurological:  Negative for numbness.     Physical Exam Triage Vital Signs ED Triage Vitals  Encounter Vitals Group     BP 10/11/23 0851 129/73     Systolic BP Percentile --      Diastolic BP Percentile --      Pulse Rate 10/11/23 0851 79     Resp 10/11/23 0851 18     Temp 10/11/23 0851 98.5 F (36.9 C)     Temp Source 10/11/23 0851 Oral     SpO2 10/11/23 0851 96 %     Weight 10/11/23 0850 120 lb (54.4 kg)     Height 10/11/23 0850 5' 9 (1.753 m)     Head Circumference --      Peak Flow --      Pain Score 10/11/23 0848 0     Pain Loc --      Pain Education --      Exclude from Growth Chart --    No data found.  Updated Vital Signs BP 129/73 (BP Location: Right Arm)   Pulse 79   Temp 98.5 F (36.9 C) (Oral)   Resp 18   Ht 5' 9 (1.753 m)   Wt 120 lb (54.4 kg)   SpO2 96%   BMI 17.72 kg/m   Visual Acuity Right Eye Distance: 20/40 (Uncorrected) Left Eye Distance: 20/40 (Uncorrected) Bilateral Distance: 20/30 (Uncorrected)  Right Eye Near:   Left Eye Near:    Bilateral Near:     Physical Exam Vitals and nursing note reviewed.  Constitutional:      General: He is not in acute distress.    Appearance: Normal appearance. He is not ill-appearing.  HENT:     Head: Normocephalic and atraumatic.  Eyes:     Extraocular Movements: Extraocular movements intact.     Pupils: Pupils are equal, round, and reactive  to light.     Comments: Left  conjunctiva within normal limits, subconjunctival hemorrhage noted to right eye, worse medially  Cardiovascular:     Rate and Rhythm: Normal rate.  Pulmonary:     Effort: Pulmonary effort is normal.  Neurological:     Mental Status: He is alert.  Psychiatric:        Mood and Affect: Mood normal.        Behavior: Behavior normal.        Thought Content: Thought content normal.      UC Treatments / Results  Labs (all labs ordered are listed, but only abnormal results are displayed) Labs Reviewed - No data to display  EKG   Radiology No results found.  Procedures Procedures (including critical care time)  Medications Ordered in UC Medications - No data to display  Initial Impression / Assessment and Plan / UC Course  I have reviewed the triage vital signs and the nursing notes.  Pertinent labs & imaging results that were available during my care of the patient were reviewed by me and considered in my medical decision making (see chart for details).    Suspect benign subconjunctival hemorrhage of right eye.  Discussed suspected progression and resolution of symptoms and advised emergency room evaluation should he develop any significant pain, worsening symptoms, headache, nausea, vomiting, other concerns.  Final Clinical Impressions(s) / UC Diagnoses   Final diagnoses:  Subconjunctival hemorrhage of right eye   Discharge Instructions   None    ED Prescriptions   None    PDMP not reviewed this encounter.   Billy Asberry FALCON, PA-C 10/11/23 [phone]

## 2023-10-11 NOTE — ED Triage Notes (Signed)
"  I woke up this morning with my right eye red". Some soreness (no pain). No watery eye. No drainage. No visual changes in the eye.

## 2023-11-07 ENCOUNTER — Other Ambulatory Visit: Payer: Self-pay | Admitting: Nurse Practitioner

## 2023-11-07 DIAGNOSIS — E7800 Pure hypercholesterolemia, unspecified: Secondary | ICD-10-CM

## 2023-11-20 ENCOUNTER — Emergency Department (HOSPITAL_COMMUNITY)
Admission: EM | Admit: 2023-11-20 | Discharge: 2023-11-20 | Disposition: A | Discharge status: Hospice | Payer: Medicaid Other | Attending: Emergency Medicine | Admitting: Emergency Medicine

## 2023-11-20 ENCOUNTER — Encounter (HOSPITAL_COMMUNITY): Payer: Self-pay | Admitting: Emergency Medicine

## 2023-11-20 ENCOUNTER — Other Ambulatory Visit: Payer: Self-pay

## 2023-11-20 ENCOUNTER — Emergency Department (HOSPITAL_COMMUNITY): Payer: Medicaid Other

## 2023-11-20 DIAGNOSIS — R1031 Right lower quadrant pain: Secondary | ICD-10-CM | POA: Diagnosis present

## 2023-11-20 DIAGNOSIS — J441 Chronic obstructive pulmonary disease with (acute) exacerbation: Secondary | ICD-10-CM

## 2023-11-20 LAB — COMPREHENSIVE METABOLIC PANEL
ALT: 26 U/L (ref 0–44)
AST: 37 U/L (ref 15–41)
Albumin: 3.6 g/dL (ref 3.5–5.0)
Alkaline Phosphatase: 85 U/L (ref 38–126)
Anion gap: 13 (ref 5–15)
BUN: 5 mg/dL — ABNORMAL LOW (ref 6–20)
CO2: 23 mmol/L (ref 22–32)
Calcium: 8.6 mg/dL — ABNORMAL LOW (ref 8.9–10.3)
Chloride: 97 mmol/L — ABNORMAL LOW (ref 98–111)
Creatinine, Ser: 1.02 mg/dL (ref 0.61–1.24)
GFR, Estimated: >60 mL/min (ref >60)
Glucose, Bld: 131 mg/dL — ABNORMAL HIGH (ref 70–99)
Potassium: 3.6 mmol/L (ref 3.5–5.1)
Sodium: 133 mmol/L — ABNORMAL LOW (ref 135–145)
Total Bilirubin: 0.6 mg/dL (ref 0.0–1.2)
Total Protein: 6.7 g/dL (ref 6.5–8.1)

## 2023-11-20 LAB — CBC WITH DIFFERENTIAL/PLATELET
Abs Immature Granulocytes: 0.04 10*3/uL (ref 0.00–0.07)
Basophils Absolute: 0.1 10*3/uL (ref 0.0–0.1)
Basophils Relative: 1 %
Eosinophils Absolute: 0.2 10*3/uL (ref 0.0–0.5)
Eosinophils Relative: 2 %
HCT: 38 % — ABNORMAL LOW (ref 39.0–52.0)
Hemoglobin: 12.6 g/dL — ABNORMAL LOW (ref 13.0–17.0)
Immature Granulocytes: 1 %
Lymphocytes Relative: 13 %
Lymphs Abs: 1.1 10*3/uL (ref 0.7–4.0)
MCH: 31.3 pg (ref 26.0–34.0)
MCHC: 33.2 g/dL (ref 30.0–36.0)
MCV: 94.5 fL (ref 80.0–100.0)
Monocytes Absolute: 1.5 10*3/uL — ABNORMAL HIGH (ref 0.1–1.0)
Monocytes Relative: 17 %
Neutro Abs: 5.6 10*3/uL (ref 1.7–7.7)
Neutrophils Relative %: 66 %
Platelets: 236 10*3/uL (ref 150–400)
RBC: 4.02 MIL/uL — ABNORMAL LOW (ref 4.22–5.81)
RDW: 12.5 % (ref 11.5–15.5)
WBC: 8.5 10*3/uL (ref 4.0–10.5)
nRBC: 0 % (ref 0.0–0.2)

## 2023-11-20 LAB — RESP PANEL BY RT-PCR (RSV, FLU A&B, COVID)  RVPGX2
Influenza A by PCR: NEGATIVE
Influenza B by PCR: NEGATIVE
Resp Syncytial Virus by PCR: NEGATIVE
SARS Coronavirus 2 by RT PCR: NEGATIVE

## 2023-11-20 MED ORDER — PREDNISONE 20 MG PO TABS
40.0000 mg | ORAL_TABLET | Freq: Every day | ORAL | 0 refills | Status: AC
Start: 2023-11-21 — End: 2023-11-25

## 2023-11-20 MED ORDER — HYDROCODONE BIT-HOMATROP MBR 5-1.5 MG/5ML PO SOLN
5.0000 mL | Freq: Once | ORAL | Status: AC
  Administered 2023-11-20: 5 mL via ORAL
  Filled 2023-11-20: qty 5

## 2023-11-20 MED ORDER — BUDESONIDE-FORMOTEROL FUMARATE 80-4.5 MCG/ACT IN AERO
2.0000 | INHALATION_SPRAY | Freq: Every day | RESPIRATORY_TRACT | 12 refills | Status: DC
  Filled 2023-11-20: qty 10.2, 30d supply, fill #0

## 2023-11-20 MED ORDER — IPRATROPIUM-ALBUTEROL 0.5-2.5 (3) MG/3ML IN SOLN
3.0000 mL | RESPIRATORY_TRACT | Status: AC
  Administered 2023-11-20 (×3): 3 mL via RESPIRATORY_TRACT
  Filled 2023-11-20: qty 9

## 2023-11-20 MED ORDER — PREDNISONE 20 MG PO TABS
40.0000 mg | ORAL_TABLET | Freq: Every day | ORAL | 0 refills | Status: DC
  Filled 2023-11-20: qty 8, 4d supply, fill #0

## 2023-11-20 MED ORDER — BUDESONIDE-FORMOTEROL FUMARATE 80-4.5 MCG/ACT IN AERO: 2.0000 | INHALATION_SPRAY | Freq: Every day | RESPIRATORY_TRACT | 12 refills | Status: DC

## 2023-11-20 NOTE — ED Provider Notes (Signed)
  ED Course / MDM   Clinical Course as of 11/20/23 0840  Thu Nov 20, 2023  0726 Received sign out from Dr. Clayborne Dana, presenting with COPD exacerbation, also with N/V/D and pending abdominal US  [WS]  0840 Ultrasound negative.  Patient feeling better, reports breathing has improved.  Feels comfortable with going home. No abdominal tenderness, doubt need for additional imaging. Will discharge patient to home. All questions answered. Patient comfortable with plan of discharge. Return precautions discussed with patient and specified on the after visit summary.  [WS]    Clinical Course User Index [WS] Lonell Grandchild, MD   Medical Decision Making Amount and/or Complexity of Data Reviewed Labs: ordered. Radiology: ordered.  Risk Prescription drug management.          Lonell Grandchild, MD 11/20/23 [phone]

## 2023-11-20 NOTE — ED Provider Notes (Signed)
[place] EMERGENCY DEPARTMENT AT [facility] Provider Note   CSN: [id number] Arrival date & time: 11/20/23  0443     History {Add pertinent medical, surgical, social history, OB history to HPI:1} Chief Complaint  Patient presents with   Shortness of Breath    Steven Li is a 58 y.o. male.  58 yo M here with dyspnea x 3 days. Non productive cough. Chills, no measured fevers. No le edema. No chest pain. Daily pack a day smoker. Also marijuana. No drugs. Etoh on weekends.  Arrives via EMS who had given a couple breathing treatments, Solu-Medrol magnesium with some improvement.   Shortness of Breath      Home Medications Prior to Admission medications   Medication Sig Start Date End Date Taking? Authorizing Provider  amLODipine (NORVASC) 10 MG tablet Take 1 tablet (10 mg total) by mouth daily. 03/31/23   Gaston Islam., NP  amoxicillin (AMOXIL) 500 MG capsule Take 1 capsule (500 mg total) by mouth 3 (three) times daily. 04/03/23     aspirin EC 81 MG tablet Take 81 mg by mouth daily. Swallow whole.    [provider]  atorvastatin (LIPITOR) 40 MG tablet TAKE 1 TABLET BY MOUTH EVERY DAY 11/10/23   Gaston Islam., NP  carvedilol (COREG) 6.25 MG tablet Take 1 tablet (6.25 mg total) by mouth 2 (two) times daily. 12/27/22   Chandrasekhar, Rondel Jumbo, MD  DULoxetine (CYMBALTA) 60 MG capsule Take 1 capsule (60 mg total) by mouth daily. For pain 11/27/22   [name], MD  gabapentin (NEURONTIN) 300 MG capsule Take 1 capsule (300 mg total) by mouth at bedtime. 11/27/22   [name], MD  ibuprofen (ADVIL) 800 MG tablet Take 1 tablet by mouth every six to eight hours as needed for pain 04/03/23     ondansetron (ZOFRAN) 4 MG tablet Take 1 tablet (4 mg total) by mouth daily as needed for nausea or vomiting. 01/02/23   Elson Areas, PA-C  traMADol (ULTRAM) 50 MG tablet Take 1 tablet (50 mg total) by mouth every 6 (six) hours as needed for pain 04/03/23     VENTOLIN  HFA 108 (90 Base) MCG/ACT inhaler INHALE 1-2 PUFFS INTO THE LUNGS EVERY 6 HOURS AS NEEDED FOR WHEEZING OR SHORTNESS OF BREATH 09/25/23   [name], MD      Allergies    Patient has no known allergies.    Review of Systems   Review of Systems  Respiratory:  Positive for shortness of breath.     Physical Exam Updated Vital Signs BP (!) 153/95 (BP Location: Right Arm)   Pulse 95   Temp 98.2 F (36.8 C) (Oral)   Resp (!) 24   Ht 5\' 9"  (1.753 m)   Wt 56.7 kg   SpO2 95%   BMI 18.46 kg/m  Physical Exam Vitals and nursing note reviewed.  Constitutional:      Appearance: He is well-developed.  HENT:     Head: Normocephalic and atraumatic.  Cardiovascular:     Rate and Rhythm: Normal rate.  Pulmonary:     Effort: Pulmonary effort is normal. Tachypnea present. No respiratory distress.     Breath sounds: Decreased breath sounds and wheezing present.  Abdominal:     General: There is no distension.  Musculoskeletal:        General: Normal range of motion.     Cervical back: Normal range of motion.     Right lower leg: No  edema.     Left lower leg: No edema.  Neurological:     Mental Status: He is alert.     ED Results / Procedures / Treatments   Labs (all labs ordered are listed, but only abnormal results are displayed) Labs Reviewed  CBC WITH DIFFERENTIAL/PLATELET - Abnormal; Notable for the following components:      Result Value   RBC 4.02 (*)    Hemoglobin 12.6 (*)    HCT 38.0 (*)    Monocytes Absolute 1.5 (*)    All other components within normal limits  COMPREHENSIVE METABOLIC PANEL    EKG None  Radiology No results found.  Procedures Procedures  {Document cardiac monitor, telemetry assessment procedure when appropriate:1}  Medications Ordered in ED Medications  ipratropium-albuterol (DUONEB) 0.5-2.5 (3) MG/3ML nebulizer solution 3 mL (has no administration in time range)  HYDROcodone bit-homatropine (HYCODAN) 5-1.5 MG/5ML syrup 5 mL (has no  administration in time range)    ED Course/ Medical Decision Making/ A&P   {   Click here for ABCD2, HEART and other calculatorsREFRESH Note before signing :1}                              Medical Decision Making Amount and/or Complexity of Data Reviewed Labs: ordered. Radiology: ordered.  Risk Prescription drug management.   Bronchoconstriction, likely bronchitis or undiagnosed copd exacerbation. Has already had steroids. Will give more nebs and reevaluate after xr.   {Document critical care time when appropriate:1} {Document review of labs and clinical decision tools ie heart score, Chads2Vasc2 etc:1}  {Document your independent review of radiology images, and any outside records:1} {Document your discussion with family members, caretakers, and with consultants:1} {Document social determinants of health affecting pt's care:1} {Document your decision making why or why not admission, treatments were needed:1} Final Clinical Impression(s) / ED Diagnoses Final diagnoses:  None    Rx / DC Orders ED Discharge Orders     None

## 2023-11-20 NOTE — ED Triage Notes (Signed)
Patient BIBA from home c/o SOB for the past 3 days. Endorsing n/v/d. 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 Hx: COPD. Expiratory wheezing in all fields. EMS gave 2 g Mag, 125 mg solumedrol, 10 mg albuterol, 1 mg atrovent, and 100 ml NS.  O2- 90% RA HR- 87 BP- 131/93

## 2023-12-08 ENCOUNTER — Encounter: Payer: Self-pay | Admitting: Family Medicine

## 2023-12-08 ENCOUNTER — Ambulatory Visit: Payer: Medicaid Other | Attending: Family Medicine | Admitting: Family Medicine

## 2023-12-08 VITALS — BP 122/73 | HR 85 | Ht 69.0 in | Wt 123.4 lb

## 2023-12-08 DIAGNOSIS — G4709 Other insomnia: Secondary | ICD-10-CM | POA: Diagnosis not present

## 2023-12-08 DIAGNOSIS — Z23 Encounter for immunization: Secondary | ICD-10-CM

## 2023-12-08 DIAGNOSIS — G621 Alcoholic polyneuropathy: Secondary | ICD-10-CM | POA: Diagnosis not present

## 2023-12-08 DIAGNOSIS — R630 Anorexia: Secondary | ICD-10-CM

## 2023-12-08 DIAGNOSIS — Z1211 Encounter for screening for malignant neoplasm of colon: Secondary | ICD-10-CM

## 2023-12-08 DIAGNOSIS — M79602 Pain in left arm: Secondary | ICD-10-CM | POA: Diagnosis not present

## 2023-12-08 DIAGNOSIS — M79601 Pain in right arm: Secondary | ICD-10-CM | POA: Diagnosis not present

## 2023-12-08 DIAGNOSIS — F17210 Nicotine dependence, cigarettes, uncomplicated: Secondary | ICD-10-CM

## 2023-12-08 MED ORDER — DULOXETINE HCL 60 MG PO CPEP
60.0000 mg | ORAL_CAPSULE | Freq: Every day | ORAL | 1 refills | Status: AC
Start: 2023-12-08 — End: ?

## 2023-12-08 MED ORDER — MIRTAZAPINE 15 MG PO TABS
15.0000 mg | ORAL_TABLET | Freq: Every day | ORAL | 1 refills | Status: DC
Start: 2023-12-08 — End: 2024-05-24

## 2023-12-08 MED ORDER — GABAPENTIN 300 MG PO CAPS: 300.0000 mg | ORAL_CAPSULE | Freq: Every day | ORAL | 1 refills | Status: DC

## 2023-12-08 NOTE — Progress Notes (Signed)
 Subjective:  Patient ID: Steven Li, male    DOB: 1966/07/04  Age: 58 y.o. MRN: [id number]  CC: Medical Management of Chronic Issues (Not eating/Not sleeping)     Discussed the use of AI scribe software for clinical note transcription with the patient, who gave verbal consent to proceed.  History of Present Illness Steven Li, a 58 year old with atherosclerosis, hypertension, hyperlipidemia, and COPD, alcohol dependence, nicotine dependence presents for chronic disease management. He was recently seen in the emergency department for a COPD exacerbation. He reports improved respiratory symptoms since the exacerbation, but continues to smoke cigarettes.  He has been experiencing insomnia for the past two weeks, characterized by difficulty falling asleep and frequent awakenings. He reports that he is unable to achieve restful sleep despite early bedtimes and over-the-counter sleep aids. He denies daytime napping and late evening caffeine intake.  In addition to the insomnia, he has had a loss of appetite for the past two weeks. He reports difficulty eating full meals and has lost three pounds since January. He denies abdominal pain, nausea or vomiting.  Endorses adherence with his antihypertensive  Past Medical History:  Diagnosis Date   Chest pain    COPD (chronic obstructive pulmonary disease) (HCC)    PRN inhaler   ETOH abuse    Headache    "a few/year" (04/17/2018)   High cholesterol    on meds   Hypertension    on meds   Migraine    "a few/year" (04/17/2018)==hx of (12/23/2022)    Past Surgical History:  Procedure Laterality Date   NO PAST SURGERIES      Family History  Problem Relation Age of Onset   Cirrhosis Mother        alcoholic   Cirrhosis Father        alcoholic    Diabetes Sister    Colon polyps Neg Hx    Colon cancer Neg Hx    Esophageal cancer Neg Hx    Rectal cancer Neg Hx    Stomach cancer Neg Hx     Social History   Socioeconomic History    Marital status: Divorced    Spouse name: Not on file   Number of children: Not on file   Years of education: Not on file   Highest education level: Not on file  Occupational History   Not on file  Tobacco Use   Smoking status: Every Day    Current packs/day: 0.50    Average packs/day: 0.5 packs/day for 33.0 years (16.5 ttl pk-yrs)    Types: Cigarettes   Smokeless tobacco: Never  Vaping Use   Vaping status: Never Used  Substance and Sexual Activity   Alcohol use: Yes    Alcohol/week: 42.0 standard drinks of alcohol    Types: 42 Cans of beer per week    Comment: 3 beers every Friday   Drug use: Yes    Frequency: 7.0 times per week    Types: Marijuana    Comment: 12/23/2022   Sexual activity: Not Currently  Other Topics Concern   Not on file  Social History Narrative   Not on file   Social Drivers of Health   Financial Resource Strain: Not on file  Food Insecurity: Not on file  Transportation Needs: Not on file  Physical Activity: Not on file  Stress: Not on file  Social Connections: Not on file    No Known Allergies  Outpatient Medications Prior to Visit  Medication Sig Dispense Refill  amLODipine (NORVASC) 10 MG tablet Take 1 tablet (10 mg total) by mouth daily. 90 tablet 3   aspirin EC 81 MG tablet Take 81 mg by mouth daily. Swallow whole.     atorvastatin (LIPITOR) 40 MG tablet TAKE 1 TABLET BY MOUTH EVERY DAY 90 tablet 1   budesonide-formoterol (SYMBICORT) 80-4.5 MCG/ACT inhaler Inhale 2 puffs into the lungs daily. 10.2 g 12   carvedilol (COREG) 6.25 MG tablet Take 1 tablet (6.25 mg total) by mouth 2 (two) times daily. 180 tablet 3   ibuprofen (ADVIL) 800 MG tablet Take 1 tablet by mouth every six to eight hours as needed for pain (Patient taking differently: Take 800 mg by mouth every 6 (six) hours as needed for mild pain (pain score 1-3) or moderate pain (pain score 4-6).) 15 tablet 0   ondansetron (ZOFRAN) 4 MG tablet Take 1 tablet (4 mg total) by mouth daily  as needed for nausea or vomiting. 12 tablet 1   VENTOLIN HFA 108 (90 Base) MCG/ACT inhaler INHALE 1-2 PUFFS INTO THE LUNGS EVERY 6 HOURS AS NEEDED FOR WHEEZING OR SHORTNESS OF BREATH (Patient taking differently: Inhale 1-2 puffs into the lungs every 6 (six) hours as needed for wheezing or shortness of breath.) 18 g 3   DULoxetine (CYMBALTA) 60 MG capsule Take 1 capsule (60 mg total) by mouth daily. For pain 90 capsule 1   amoxicillin (AMOXIL) 500 MG capsule Take 1 capsule (500 mg total) by mouth 3 (three) times daily. (Patient not taking: Reported on 12/08/2023) 18 capsule 0   traMADol (ULTRAM) 50 MG tablet Take 1 tablet (50 mg total) by mouth every 6 (six) hours as needed for pain (Patient not taking: Reported on 12/08/2023) 10 tablet 0   gabapentin (NEURONTIN) 300 MG capsule Take 1 capsule (300 mg total) by mouth at bedtime. (Patient not taking: Reported on 12/08/2023) 90 capsule 1   No facility-administered medications prior to visit.     ROS Review of Systems  Constitutional:  Negative for activity change and appetite change.  HENT:  Negative for sinus pressure and sore throat.   Respiratory:  Negative for chest tightness, shortness of breath and wheezing.   Cardiovascular:  Negative for chest pain and palpitations.  Gastrointestinal:  Negative for abdominal distention, abdominal pain and constipation.  Genitourinary: Negative.   Psychiatric/Behavioral:  Positive for sleep disturbance. Negative for behavioral problems and dysphoric mood.     Objective:  BP 122/73   Pulse 85   Ht 5\' 9"  (1.753 m)   Wt 123 lb 6.4 oz (56 kg)   SpO2 97%   BMI 18.22 kg/m      12/08/2023    2:08 PM 11/20/2023    8:03 AM 11/20/2023    6:45 AM  BP/Weight  Systolic BP 122 139 134  Diastolic BP 73 85 93  Wt. (Lbs) 123.4    BMI 18.22 kg/m2      Wt Readings from Last 3 Encounters:  12/08/23 123 lb 6.4 oz (56 kg)  11/20/23 125 lb (56.7 kg)  10/11/23 120 lb (54.4 kg)      Physical Exam Constitutional:       Appearance: He is well-developed.  Cardiovascular:     Rate and Rhythm: Normal rate.     Heart sounds: Normal heart sounds. No murmur heard. Pulmonary:     Effort: Pulmonary effort is normal.     Breath sounds: Normal breath sounds. No wheezing or rales.  Chest:     Chest wall: No tenderness.  Abdominal:     General: Bowel sounds are normal. There is no distension.     Palpations: Abdomen is soft. There is no mass.     Tenderness: There is no abdominal tenderness.  Musculoskeletal:        General: Normal range of motion.     Right lower leg: No edema.     Left lower leg: No edema.  Neurological:     Mental Status: He is alert and oriented to person, place, and time.  Psychiatric:        Mood and Affect: Mood normal.        Latest Ref Rng & Units 11/20/2023    4:50 AM 06/23/2023    9:42 AM 03/31/2023    9:54 AM  CMP  Glucose 70 - 99 mg/dL 308     BUN 6 - 20 mg/dL 5     Creatinine 6.57 - 1.24 mg/dL 8.46     Sodium 962 - 952 mmol/L 133     Potassium 3.5 - 5.1 mmol/L 3.6     Chloride 98 - 111 mmol/L 97     CO2 22 - 32 mmol/L 23     Calcium 8.9 - 10.3 mg/dL 8.6     Total Protein 6.5 - 8.1 g/dL 6.7  6.9  7.2   Total Bilirubin 0.0 - 1.2 mg/dL 0.6  0.5  0.2   Alkaline Phos 38 - 126 U/L 85  101  87   AST 15 - 41 U/L 37  30  29   ALT 0 - 44 U/L 26  19  27      Lipid Panel     Component Value Date/Time   CHOL 139 06/23/2023 0942   TRIG 51 06/23/2023 0942   HDL 94 06/23/2023 0942   CHOLHDL 1.5 06/23/2023 0942   LDLCALC 34 06/23/2023 0942    CBC    Component Value Date/Time   WBC 8.5 11/20/2023 0450   RBC 4.02 (L) 11/20/2023 0450   HGB 12.6 (L) 11/20/2023 0450   HGB 12.8 (L) 02/01/2019 0921   HCT 38.0 (L) 11/20/2023 0450   HCT 36.6 (L) 02/01/2019 0921   PLT 236 11/20/2023 0450   PLT 321 02/01/2019 0921   MCV 94.5 11/20/2023 0450   MCV 88 02/01/2019 0921   MCH 31.3 11/20/2023 0450   MCHC 33.2 11/20/2023 0450   RDW 12.5 11/20/2023 0450   RDW 11.8 02/01/2019  0921   LYMPHSABS 1.1 11/20/2023 0450   MONOABS 1.5 (H) 11/20/2023 0450   EOSABS 0.2 11/20/2023 0450   BASOSABS 0.1 11/20/2023 0450    Lab Results  Component Value Date   HGBA1C 5.2 11/27/2021       Assessment & Plan Insomnia Difficulty sleeping for two weeks with ineffective over-the-counter aids. - Prescribe mirtazapine to aid with sleep and increase appetite.  Decreased appetite and weight loss Decreased appetite and three-pound weight loss over two weeks. Previous imaging showed no gallstones, only cholesterol deposits. Lung nodules present but no cancer. - Prescribe mirtazapine to increase appetite. - Order repeat CT scan of the lungs to monitor nodules. - Refer for colonoscopy for colon cancer screening.  Chronic Obstructive Pulmonary Disease (COPD) Recent exacerbation with emergency visit. Smoking cessation needed. -Currently on Symbicort  Hypertension -Doing well on carvedilol, amlodipine No changes in management discussed.   Hyperlipidemia -Currently on atorvastatin -Low-cholesterol diet No changes in management discussed.  Preventive care Due for pneumonia vaccine and colonoscopy. Lung cancer screening to be repeated. - Administer pneumonia vaccine. -  Refer for colonoscopy for colon cancer screening. - Order repeat CT scan of the lungs.  Follow-up Monitor appetite and overall condition. - Schedule follow-up appointment in three months.      Meds ordered this encounter  Medications   DULoxetine (CYMBALTA) 60 MG capsule    Sig: Take 1 capsule (60 mg total) by mouth daily. For pain    Dispense:  90 capsule    Refill:  1   gabapentin (NEURONTIN) 300 MG capsule    Sig: Take 1 capsule (300 mg total) by mouth at bedtime.    Dispense:  90 capsule    Refill:  1   mirtazapine (REMERON) 15 MG tablet    Sig: Take 1 tablet (15 mg total) by mouth at bedtime.    Dispense:  90 tablet    Refill:  1    Follow-up: Return in about 3 months (around 03/09/2024)  for Chronic medical conditions.       [name], MD, FAAFP. [facility] [place], [place] [phone]   12/08/2023, 5:21 PM

## 2023-12-08 NOTE — Patient Instructions (Addendum)
 VISIT SUMMARY:  Steven Li, during your visit today, we discussed your ongoing health issues, including your recent COPD exacerbation, insomnia, decreased appetite, and weight loss. We also reviewed your chronic conditions and preventive care needs.  YOUR PLAN:  -INSOMNIA: Insomnia is difficulty falling or staying asleep. We will start you on mirtazapine to help you sleep better and improve your appetite.  -DECREASED APPETITE AND WEIGHT LOSS: You have experienced a loss of appetite and weight loss over the past two weeks. We will start you on mirtazapine to help increase your appetite. Additionally, we will repeat a CT scan of your lungs to monitor the nodules and refer you for a colonoscopy to screen for colon cancer.  -CHRONIC OBSTRUCTIVE PULMONARY DISEASE (COPD): COPD is a chronic lung condition that makes it hard to breathe. You recently had an exacerbation and it's important to stop smoking to prevent further issues.  -HYPERTENSION: Hypertension is high blood pressure. No changes were made to your current management plan.   -HYPOLIPIDEMIA: Hypolipidemia is having low levels of lipids (fats) in the blood. No changes were made to your current management plan.  -PREVENTIVE CARE: You are due for a pneumonia vaccine and a colonoscopy. We will administer the pneumonia vaccine today, refer you for a colonoscopy, and order a repeat CT scan of your lungs for cancer screening.  INSTRUCTIONS:  Please schedule a follow-up appointment in three months to monitor your appetite and overall condition. Additionally, ensure you complete the colonoscopy and repeat CT scan of your lungs as discussed.

## 2024-01-05 ENCOUNTER — Inpatient Hospital Stay: Admission: RE | Admit: 2024-01-05 | Source: Ambulatory Visit

## 2024-01-20 ENCOUNTER — Other Ambulatory Visit: Payer: Self-pay

## 2024-01-20 MED ORDER — CARVEDILOL 6.25 MG PO TABS: 6.2500 mg | ORAL_TABLET | Freq: Two times a day (BID) | ORAL | 0 refills | Status: DC

## 2024-02-02 ENCOUNTER — Other Ambulatory Visit

## 2024-02-16 ENCOUNTER — Inpatient Hospital Stay: Admission: RE | Admit: 2024-02-16 | Source: Ambulatory Visit

## 2024-03-08 ENCOUNTER — Ambulatory Visit: Admitting: Family Medicine

## 2024-03-26 ENCOUNTER — Encounter: Payer: Self-pay | Admitting: Family Medicine

## 2024-04-05 ENCOUNTER — Ambulatory Visit
Admission: RE | Admit: 2024-04-05 | Discharge: 2024-04-05 | Disposition: A | Discharge status: Home / Self Care | Source: Ambulatory Visit | Attending: Family Medicine | Admitting: Family Medicine

## 2024-04-05 DIAGNOSIS — F17210 Nicotine dependence, cigarettes, uncomplicated: Secondary | ICD-10-CM

## 2024-04-13 ENCOUNTER — Ambulatory Visit: Payer: Self-pay | Admitting: Family Medicine

## 2024-04-16 ENCOUNTER — Ambulatory Visit: Payer: Self-pay

## 2024-04-16 NOTE — Telephone Encounter (Signed)
 Copied from CRM [id number]. Topic: Clinical - Lab/Test Results >> Apr 16, 2024 12:25 PM Tobias L wrote:  Reason for CRM: Patient returning call about CT results. Requesting callback, [phone]

## 2024-04-16 NOTE — Telephone Encounter (Signed)
   FYI Only or Action Required?: FYI only for provider.  Patient was last seen in primary care on 12/08/2023 by Newlin, Enobong, MD.  Called Nurse Triage reporting Advice Only.  Triage Disposition: Information or Advice Only Call  Patient/caregiver understands and will follow disposition?: Yes        Copied from CRM [id number]. Topic: Clinical - Lab/Test Results >> Apr 16, 2024  2:14 PM Winona R wrote: Pt calling for CT labs and have been missing calls from [facility], whose can not assist the pt at this time. Asked if e2c2 triage can relay the message Reason for Disposition  [1] Follow-up call to recent contact AND [2] information only call, no triage required  Answer Assessment - Initial Assessment Questions 1. REASON FOR CALL: What is the main reason for your call? or How can I best help you?     Missed call about CT results, relayed results and explained low cholesterol diet, and scheduled yearly medication renewal for aug  Protocols used: Information Only Call - No Triage-A-AH

## 2024-04-16 NOTE — Telephone Encounter (Signed)
 Noted

## 2024-05-24 ENCOUNTER — Ambulatory Visit: Attending: Family Medicine | Admitting: Family Medicine

## 2024-05-24 ENCOUNTER — Encounter: Payer: Self-pay | Admitting: Family Medicine

## 2024-05-24 VITALS — BP 144/84 | HR 60 | Ht 69.0 in | Wt 120.2 lb

## 2024-05-24 DIAGNOSIS — G4709 Other insomnia: Secondary | ICD-10-CM | POA: Diagnosis not present

## 2024-05-24 DIAGNOSIS — E7800 Pure hypercholesterolemia, unspecified: Secondary | ICD-10-CM

## 2024-05-24 DIAGNOSIS — G621 Alcoholic polyneuropathy: Secondary | ICD-10-CM | POA: Diagnosis not present

## 2024-05-24 DIAGNOSIS — I10 Essential (primary) hypertension: Secondary | ICD-10-CM

## 2024-05-24 DIAGNOSIS — R630 Anorexia: Secondary | ICD-10-CM

## 2024-05-24 DIAGNOSIS — J449 Chronic obstructive pulmonary disease, unspecified: Secondary | ICD-10-CM

## 2024-05-24 DIAGNOSIS — Z125 Encounter for screening for malignant neoplasm of prostate: Secondary | ICD-10-CM | POA: Diagnosis not present

## 2024-05-24 DIAGNOSIS — R42 Dizziness and giddiness: Secondary | ICD-10-CM

## 2024-05-24 DIAGNOSIS — F17210 Nicotine dependence, cigarettes, uncomplicated: Secondary | ICD-10-CM

## 2024-05-24 MED ORDER — MIRTAZAPINE 30 MG PO TABS: 30.0000 mg | ORAL_TABLET | Freq: Every day | ORAL | 1 refills | Status: AC

## 2024-05-24 NOTE — Patient Instructions (Signed)

## 2024-05-24 NOTE — Progress Notes (Signed)
 Subjective:  Patient ID: Steven Li, male    DOB: 19-Apr-1966  Age: 58 y.o. MRN: [id number]  CC: Medical Management of Chronic Issues     Discussed the use of AI scribe software for clinical note transcription with the patient, who gave verbal consent to proceed.  History of Present Illness Steven Li is a 58 year old male with atherosclerosis, hypertension, hyperlipidemia, and COPD, alcohol dependence, alcoholic peripheral neuropathy, nicotine  dependence who presents with appetite issues and medication management.  He had all of his teeth extracted last week and is currently unable to eat solid foods, relying on soup for nutrition. Appetite has been decreased for two to three months prior to the dental procedure. Mirtazapine  was prescribed to increase appetite and for insomnia, but there has been no improvement. There is no abdominal pain or bleeding. A CT scan last month ruled out lung cancer.  He experiences dizziness when bending down and standing up, requiring a moment to recover. He does not have a means to check his blood pressure at home, though his sister has a machine.  He is on medication for blood pressure and cholesterol and uses Symbicort  for COPD. Gabapentin  is not currently taken as hand pain from alcoholic neuropathy has subsided.  He continues to smoke without plans to quit and reports ongoing sleep difficulties.    Past Medical History:  Diagnosis Date   Chest pain    COPD (chronic obstructive pulmonary disease) (HCC)    PRN inhaler   ETOH abuse    Headache    a few/year (04/17/2018)   High cholesterol    on meds   Hypertension    on meds   Migraine    a few/year (04/17/2018)==hx of (12/23/2022)    Past Surgical History:  Procedure Laterality Date   NO PAST SURGERIES      Family History  Problem Relation Age of Onset   Cirrhosis Mother        alcoholic   Cirrhosis Father        alcoholic    Diabetes Sister    Colon polyps Neg Hx    Colon  cancer Neg Hx    Esophageal cancer Neg Hx    Rectal cancer Neg Hx    Stomach cancer Neg Hx     Social History   Socioeconomic History   Marital status: Divorced    Spouse name: Not on file   Number of children: Not on file   Years of education: Not on file   Highest education level: Not on file  Occupational History   Not on file  Tobacco Use   Smoking status: Every Day    Current packs/day: 0.50    Average packs/day: 0.5 packs/day for 33.0 years (16.5 ttl pk-yrs)    Types: Cigarettes   Smokeless tobacco: Never  Vaping Use   Vaping status: Never Used  Substance and Sexual Activity   Alcohol use: Yes    Alcohol/week: 42.0 standard drinks of alcohol    Types: 42 Cans of beer per week    Comment: 3 beers every Friday   Drug use: Yes    Frequency: 7.0 times per week    Types: Marijuana    Comment: 12/23/2022   Sexual activity: Not Currently  Other Topics Concern   Not on file  Social History Narrative   Not on file   Social Drivers of Health   Financial Resource Strain: Not on file  Food Insecurity: Not on file  Transportation  Needs: Not on file  Physical Activity: Not on file  Stress: Not on file  Social Connections: Not on file    No Known Allergies  Outpatient Medications Prior to Visit  Medication Sig Dispense Refill   amLODipine  (NORVASC ) 10 MG tablet Take 1 tablet (10 mg total) by mouth daily. 90 tablet 3   aspirin EC 81 MG tablet Take 81 mg by mouth daily. Swallow whole.     atorvastatin  (LIPITOR) 40 MG tablet TAKE 1 TABLET BY MOUTH EVERY DAY 90 tablet 1   budesonide -formoterol  (SYMBICORT ) 80-4.5 MCG/ACT inhaler Inhale 2 puffs into the lungs daily. 10.2 g 12   carvedilol  (COREG ) 6.25 MG tablet Take 1 tablet (6.25 mg total) by mouth 2 (two) times daily. 180 tablet 0   DULoxetine  (CYMBALTA ) 60 MG capsule Take 1 capsule (60 mg total) by mouth daily. For pain 90 capsule 1   ibuprofen  (ADVIL ) 800 MG tablet Take 1 tablet by mouth every six to eight hours as  needed for pain (Patient taking differently: Take 800 mg by mouth every 6 (six) hours as needed for mild pain (pain score 1-3) or moderate pain (pain score 4-6).) 15 tablet 0   ondansetron  (ZOFRAN ) 4 MG tablet Take 1 tablet (4 mg total) by mouth daily as needed for nausea or vomiting. 12 tablet 1   VENTOLIN  HFA 108 (90 Base) MCG/ACT inhaler INHALE 1-2 PUFFS INTO THE LUNGS EVERY 6 HOURS AS NEEDED FOR WHEEZING OR SHORTNESS OF BREATH (Patient taking differently: Inhale 1-2 puffs into the lungs every 6 (six) hours as needed for wheezing or shortness of breath.) 18 g 3   mirtazapine  (REMERON ) 15 MG tablet Take 1 tablet (15 mg total) by mouth at bedtime. 90 tablet 1   traMADol  (ULTRAM ) 50 MG tablet Take 1 tablet (50 mg total) by mouth every 6 (six) hours as needed for pain 10 tablet 0   amoxicillin  (AMOXIL ) 500 MG capsule Take 1 capsule (500 mg total) by mouth 3 (three) times daily. (Patient not taking: Reported on 05/24/2024) 18 capsule 0   gabapentin  (NEURONTIN ) 300 MG capsule Take 1 capsule (300 mg total) by mouth at bedtime. (Patient not taking: Reported on 05/24/2024) 90 capsule 1   No facility-administered medications prior to visit.     ROS Review of Systems  Constitutional:  Negative for activity change and appetite change.  HENT:  Negative for sinus pressure and sore throat.   Respiratory:  Negative for chest tightness, shortness of breath and wheezing.   Cardiovascular:  Negative for chest pain and palpitations.  Gastrointestinal:  Negative for abdominal distention, abdominal pain and constipation.  Genitourinary: Negative.   Musculoskeletal: Negative.   Psychiatric/Behavioral:  Positive for sleep disturbance. Negative for behavioral problems and dysphoric mood.     Objective:  BP (!) 144/84   Pulse 60   Ht 5' 9 (1.753 m)   Wt 120 lb 3.2 oz (54.5 kg)   SpO2 99%   BMI 17.75 kg/m      05/24/2024   11:10 AM 05/24/2024   10:39 AM 12/08/2023    2:08 PM  BP/Weight  Systolic BP 144 146  122  Diastolic BP 84 83 73  Wt. (Lbs)  120.2 123.4  BMI  17.75 kg/m2 18.22 kg/m2      Physical Exam Constitutional:      Appearance: He is well-developed.  Cardiovascular:     Rate and Rhythm: Normal rate.     Heart sounds: Normal heart sounds. No murmur heard. Pulmonary:  Effort: Pulmonary effort is normal.     Breath sounds: Normal breath sounds. No wheezing or rales.  Chest:     Chest wall: No tenderness.  Abdominal:     General: Bowel sounds are normal. There is no distension.     Palpations: Abdomen is soft. There is no mass.     Tenderness: There is no abdominal tenderness.  Musculoskeletal:        General: Normal range of motion.     Right lower leg: No edema.     Left lower leg: No edema.  Neurological:     Mental Status: He is alert and oriented to person, place, and time.  Psychiatric:        Mood and Affect: Mood normal.        Latest Ref Rng & Units 11/20/2023    4:50 AM 06/23/2023    9:42 AM 03/31/2023    9:54 AM  CMP  Glucose 70 - 99 mg/dL 868     BUN 6 - 20 mg/dL 5     Creatinine 9.38 - 1.24 mg/dL 8.97     Sodium 864 - 854 mmol/L 133     Potassium 3.5 - 5.1 mmol/L 3.6     Chloride 98 - 111 mmol/L 97     CO2 22 - 32 mmol/L 23     Calcium  8.9 - 10.3 mg/dL 8.6     Total Protein 6.5 - 8.1 g/dL 6.7  6.9  7.2   Total Bilirubin 0.0 - 1.2 mg/dL 0.6  0.5  0.2   Alkaline Phos 38 - 126 U/L 85  101  87   AST 15 - 41 U/L 37  30  29   ALT 0 - 44 U/L 26  19  27      Lipid Panel     Component Value Date/Time   CHOL 139 06/23/2023 0942   TRIG 51 06/23/2023 0942   HDL 94 06/23/2023 0942   CHOLHDL 1.5 06/23/2023 0942   LDLCALC 34 06/23/2023 0942    CBC    Component Value Date/Time   WBC 8.5 11/20/2023 0450   RBC 4.02 (L) 11/20/2023 0450   HGB 12.6 (L) 11/20/2023 0450   HGB 12.8 (L) 02/01/2019 0921   HCT 38.0 (L) 11/20/2023 0450   HCT 36.6 (L) 02/01/2019 0921   PLT 236 11/20/2023 0450   PLT 321 02/01/2019 0921   MCV 94.5 11/20/2023 0450   MCV 88  02/01/2019 0921   MCH 31.3 11/20/2023 0450   MCHC 33.2 11/20/2023 0450   RDW 12.5 11/20/2023 0450   RDW 11.8 02/01/2019 0921   LYMPHSABS 1.1 11/20/2023 0450   MONOABS 1.5 (H) 11/20/2023 0450   EOSABS 0.2 11/20/2023 0450   BASOSABS 0.1 11/20/2023 0450    Lab Results  Component Value Date   HGBA1C 5.2 11/27/2021        Assessment & Plan Decreased appetite Persistent decreased appetite and weight loss over two to three months. Recent dental procedure may contribute. Previous CT scan ruled out lung cancer. Mirtazapine  previously prescribed without improvement. - Increase mirtazapine  to 30 mg daily for appetite and sleep. - Order blood tests to screen for prostate cancer.  Essential hypertension Blood pressure slightly elevated. Previous cardiology consultation and medication adjustments noted. - Last blood pressure at the previous visit was normal hence I will make no regimen changes today. - Continue current blood pressure medications. - Recheck blood pressure at home, especially when standing, to assess for orthostatic changes. -Counseled on blood  pressure goal of less than 130/80, low-sodium, DASH diet, medication compliance, 150 minutes of moderate intensity exercise per week. Discussed medication compliance, adverse effects.   Chronic obstructive pulmonary disease (COPD) Stable Continues Symbicort  inhaler use.  Insomnia Ongoing difficulty sleeping. Mirtazapine  prescribed for insomnia and appetite. - Increase mirtazapine  to 30 mg daily for sleep and appetite.  Orthostatic dizziness Reports dizziness upon standing, especially after bending. Possible orthostatic hypotension considered. - Check blood pressure at home, especially when standing, to assess for orthostatic changes.   Pure hypercholesterolemia - Controlled -Continue statin   Alcoholic peripheral neuropathy - Improved -He no longer needs gabapentin  -I will discontinue this.   Healthcare  maintenance Screening for prostate cancer PSA sent off  Meds ordered this encounter  Medications   mirtazapine  (REMERON ) 30 MG tablet    Sig: Take 1 tablet (30 mg total) by mouth at bedtime.    Dispense:  90 tablet    Refill:  1    Follow-up: Return in about 6 months (around 11/24/2024) for Chronic medical conditions.       Corrina Sabin, MD, FAAFP. [facility] [place], [place] [phone]   05/24/2024, 12:10 PM

## 2024-05-25 ENCOUNTER — Ambulatory Visit: Payer: Self-pay | Admitting: Family Medicine

## 2024-05-25 DIAGNOSIS — E875 Hyperkalemia: Secondary | ICD-10-CM

## 2024-05-25 LAB — CMP14+EGFR
ALT: 41 IU/L (ref 0–44)
AST: 56 IU/L — ABNORMAL HIGH (ref 0–40)
Albumin: 4.6 g/dL (ref 3.8–4.9)
Alkaline Phosphatase: 108 IU/L (ref 44–121)
BUN/Creatinine Ratio: 12 (ref 10–24)
BUN: 11 mg/dL (ref 8–27)
Bilirubin Total: 0.4 mg/dL (ref 0.0–1.2)
CO2: 22 mmol/L (ref 20–29)
Calcium: 10.2 mg/dL (ref 8.6–10.2)
Chloride: 100 mmol/L (ref 96–106)
Creatinine, Ser: 0.89 mg/dL (ref 0.76–1.27)
Globulin, Total: 2.4 g/dL (ref 1.5–4.5)
Glucose: 90 mg/dL (ref 70–99)
Potassium: 5.7 mmol/L — ABNORMAL HIGH (ref 3.5–5.2)
Sodium: 139 mmol/L (ref 134–144)
Total Protein: 7 g/dL (ref 6.0–8.5)
eGFR: 98 mL/min/1.73 (ref >59)

## 2024-05-25 LAB — PSA, TOTAL AND FREE
PSA, Free Pct: 48.6 %
PSA, Free: 0.34 ng/mL
Prostate Specific Ag, Serum: 0.7 ng/mL (ref 0.0–4.0)

## 2024-06-14 ENCOUNTER — Ambulatory Visit: Attending: Family Medicine

## 2024-06-14 DIAGNOSIS — E875 Hyperkalemia: Secondary | ICD-10-CM

## 2024-06-15 ENCOUNTER — Ambulatory Visit: Payer: Self-pay | Admitting: Family Medicine

## 2024-06-15 ENCOUNTER — Telehealth: Payer: Self-pay

## 2024-06-15 ENCOUNTER — Other Ambulatory Visit: Payer: Self-pay

## 2024-06-15 DIAGNOSIS — E875 Hyperkalemia: Secondary | ICD-10-CM

## 2024-06-15 LAB — POTASSIUM: Potassium: 6 mmol/L — ABNORMAL HIGH (ref 3.5–5.2)

## 2024-06-15 MED ORDER — HYDROCHLOROTHIAZIDE 25 MG PO TABS: 25.0000 mg | ORAL_TABLET | Freq: Every day | ORAL | 1 refills | Status: DC

## 2024-06-15 MED ORDER — SODIUM POLYSTYRENE SULFONATE 15 GM/60ML CO SUSP: 15.0000 g | Freq: Once | 0 refills | Status: AC

## 2024-06-15 NOTE — Telephone Encounter (Signed)
 Copied from CRM [id number]. Topic: Clinical - Prescription Issue >> Jun 15, 2024  1:05 PM Precious C wrote: Reason for CRM: Patient called regarding potassium medication prescribed to him hydrochlorothiazide  (HYDRODIURIL ) 25 MG tablet sodium polystyrene (KAYEXALATE ) 15 GM/60ML suspension.   He stated that when he went to pick it up at the pharmacy(which was the incorrect pharmacy), he was informed that his insurance does not cover the medication and he would need to contact his provider for an alternative. The patient is requesting an alternative potassium medication that will be covered by his insurance and is wanting more information on what potassium is and why he was prescribed it.   Also, he requested all Rx moving forward to be sent to his new preferred pharmacy [id number] - [place], [place] - [address] [place] [address] Phone: [phone] Fax: [phone]   Pt is requesting a callback via telephone for F/U: [phone]

## 2024-06-15 NOTE — Progress Notes (Signed)
 Called pt to inform him of lab results and message from provider, no answer, left VM to return call

## 2024-06-18 ENCOUNTER — Telehealth: Payer: Self-pay

## 2024-06-18 ENCOUNTER — Other Ambulatory Visit: Payer: Self-pay

## 2024-06-18 MED ORDER — HYDROCHLOROTHIAZIDE 25 MG PO TABS: 25.0000 mg | ORAL_TABLET | Freq: Every day | ORAL | 1 refills | Status: AC

## 2024-06-18 NOTE — Telephone Encounter (Signed)
 Duplicate message

## 2024-06-18 NOTE — Telephone Encounter (Signed)
 Pt is needing another medication (KAYEXALATE ) 15 GM/60ML suspension is not covered by his insurance. He is using the [facility] on [address]

## 2024-06-21 MED ORDER — LOKELMA 10 G PO PACK: 10.0000 g | PACK | Freq: Every day | ORAL | 0 refills | Status: AC

## 2024-06-21 NOTE — Addendum Note (Signed)
 Addended by: Chen Holzman on: 06/21/2024 12:51 PM   Modules accepted: Orders

## 2024-06-21 NOTE — Telephone Encounter (Signed)
 Patient has been informed.

## 2024-06-21 NOTE — Telephone Encounter (Signed)
 Prescription for Lokelma  has been sent to requested Pharmacy

## 2024-06-23 ENCOUNTER — Other Ambulatory Visit: Payer: Self-pay | Admitting: Family Medicine

## 2024-06-23 NOTE — Telephone Encounter (Unsigned)
 Copied from CRM [id number]. Topic: Clinical - Medication Refill >> Jun 23, 2024  3:04 PM Carlatta H wrote: Medication: carvedilol  (COREG ) 6.25 MG tablet Gabeapentin  Has the patient contacted their pharmacy? No (Agent: If no, request that the patient contact the pharmacy for the refill. If patient does not wish to contact the pharmacy document the reason why and proceed with request.) (Agent: If yes, when and what did the pharmacy advise?)  This is the patient's preferred pharmacy:  [facility] [address], [place] - [address] [place] [address] Phone: [phone] Fax: [phone]  Is this the correct pharmacy for this prescription? Yes If no, delete pharmacy and type the correct one.   Has the prescription been filled recently? No  Is the patient out of the medication? Yes  Has the patient been seen for an appointment in the last year OR does the patient have an upcoming appointment? Yes  Can we respond through MyChart? No  Agent: Please be advised that Rx refills may take up to 3 business days. We ask that you follow-up with your pharmacy.

## 2024-06-24 ENCOUNTER — Other Ambulatory Visit: Payer: Self-pay | Admitting: Nurse Practitioner

## 2024-06-24 MED ORDER — CARVEDILOL 6.25 MG PO TABS: 6.2500 mg | ORAL_TABLET | Freq: Two times a day (BID) | ORAL | 1 refills | Status: AC

## 2024-06-25 ENCOUNTER — Other Ambulatory Visit: Payer: Self-pay | Admitting: Family Medicine

## 2024-06-25 NOTE — Telephone Encounter (Unsigned)
 Copied from CRM [id number]. Topic: Clinical - Medication Refill >> Jun 25, 2024 12:50 PM Carlatta H wrote: Medication: amLODipine  (NORVASC ) 10 MG tablet carvedilol  (COREG ) 6.25 MG tablet  Has the patient contacted their pharmacy? No (Agent: If no, request that the patient contact the pharmacy for the refill. If patient does not wish to contact the pharmacy document the reason why and proceed with request.) (Agent: If yes, when and what did the pharmacy advise?)  This is the patient's preferred pharmacy:  [facility] [address] [place] - [address] [place] [address] Phone: [phone] Fax: [phone]  Is this the correct pharmacy for this prescription? Yes If no, delete pharmacy and type the correct one.   Has the prescription been filled recently? No  Is the patient out of the medication? Yes  Has the patient been seen for an appointment in the last year OR does the patient have an upcoming appointment? Yes  Can we respond through MyChart? No  Agent: Please be advised that Rx refills may take up to 3 business days. We ask that you follow-up with your pharmacy.

## 2024-06-30 ENCOUNTER — Other Ambulatory Visit: Payer: Self-pay | Admitting: Family Medicine

## 2024-06-30 NOTE — Telephone Encounter (Unsigned)
 Copied from CRM #[id number]. Topic: Clinical - Medication Refill >> Jun 30, 2024 12:42 PM Sophia H wrote: Medication: budesonide -formoterol  (SYMBICORT ) 80-4.5 MCG/ACT inhaler   Has the patient contacted their pharmacy? Yes, states has contacted pharmacy 2 days in a row with no luck refilling.   This is the patient's preferred pharmacy:  [facility] [address] [place] - [address] [place] [address] Phone: [phone] Fax: [phone]  Is this the correct pharmacy for this prescription? Yes If no, delete pharmacy and type the correct one.   Has the prescription been filled recently? Yes  Is the patient out of the medication? Yes  Has the patient been seen for an appointment in the last year OR does the patient have an upcoming appointment? Yes, seen Aug 18  Can we respond through MyChart? No, prefers phone call.   Agent: Please be advised that Rx refills may take up to 3 business days. We ask that you follow-up with your pharmacy.

## 2024-07-01 MED ORDER — BUDESONIDE-FORMOTEROL FUMARATE 80-4.5 MCG/ACT IN AERO: 2.0000 | INHALATION_SPRAY | Freq: Every day | RESPIRATORY_TRACT | 12 refills | Status: AC

## 2024-07-01 NOTE — Telephone Encounter (Signed)
 Requested medications are due for refill today.  unsure  Requested medications are on the active medications list.  yes  Last refill. 11/20/2023  Future visit scheduled.   yes  Notes to clinic.  Rx signed by Dr. Elsie Body    Requested Prescriptions  Pending Prescriptions Disp Refills   budesonide -formoterol  (SYMBICORT ) 80-4.5 MCG/ACT inhaler 10.2 g 12    Sig: Inhale 2 puffs into the lungs daily.     Pulmonology:  Combination Products Passed - 07/01/2024  4:34 PM      Passed - Valid encounter within last 12 months    Recent Outpatient Visits           1 month ago Other insomnia   [facility] [place] Delbert Clam, MD   6 months ago Other insomnia   [facility] [place]. [facility] Delbert Clam, MD   1 year ago Chest pain due to myocardial ischemia, unspecified ischemic chest pain type   [facility] [place]. [facility] Delbert Clam, MD   2 years ago Bilateral arm pain   [facility] [place]. [facility] Delbert Clam, MD   2 years ago Screening for colon cancer   [facility] [place]. [facility] Delbert Clam, MD

## 2024-07-20 ENCOUNTER — Other Ambulatory Visit: Payer: Self-pay | Admitting: Nurse Practitioner

## 2024-07-20 ENCOUNTER — Other Ambulatory Visit: Payer: Self-pay | Admitting: Family Medicine

## 2024-07-20 DIAGNOSIS — E7800 Pure hypercholesterolemia, unspecified: Secondary | ICD-10-CM

## 2024-07-20 NOTE — Telephone Encounter (Unsigned)
 Copied from CRM [id number]. Topic: Clinical - Medication Refill >> Jul 20, 2024 12:46 PM Turkey B wrote: Medication: atorvastatin  (LIPITOR) 40 MG tablet  Has the patient contacted their pharmacy? yes  (Agent: If yes, when and what did the pharmacy advise?)contact pcp Patient states doesn't have contact with previous prescribing Dr, but says Dr Newlin has prescribed this previously  This is the patient's preferred pharmacy:  [facility] [address], [place] - [address]  [place] [address] Phone: [phone] Fax: [phone]  Is this the correct pharmacy for this prescription? yes If no, delete pharmacy and type the correct one.   Has the prescription been filled recently? no  Is the patient out of the medication? yes  Has the patient been seen for an appointment in the last year OR does the patient have an upcoming appointment? yes  Can we respond through MyChart? no  Agent: Please be advised that Rx refills may take up to 3 business days. We ask that you follow-up with your pharmacy.

## 2024-07-22 NOTE — Telephone Encounter (Signed)
 Requested Prescriptions  Refused Prescriptions Disp Refills   atorvastatin  (LIPITOR) 40 MG tablet 90 tablet 1    Sig: Take 1 tablet (40 mg total) by mouth daily.     Cardiovascular:  Antilipid - Statins Failed - 07/22/2024  1:47 PM      Failed - Lipid Panel in normal range within the last 12 months    Cholesterol, Total  Date Value Ref Range Status  06/23/2023 139 100 - 199 mg/dL Final   LDL Chol Calc (NIH)  Date Value Ref Range Status  06/23/2023 34 0 - 99 mg/dL Final   HDL  Date Value Ref Range Status  06/23/2023 94 >39 mg/dL Final   Triglycerides  Date Value Ref Range Status  06/23/2023 51 0 - 149 mg/dL Final         Passed - Patient is not pregnant      Passed - Valid encounter within last 12 months    Recent Outpatient Visits           1 month ago Other insomnia   [facility] [place]. [facility] Delbert Clam, MD   7 months ago Other insomnia   [facility] [place]. [facility] Delbert Clam, MD   1 year ago Chest pain due to myocardial ischemia, unspecified ischemic chest pain type   [place] [facility] [place]. [facility] Delbert Clam, MD   2 years ago Bilateral arm pain   [place] [facility] [place]. [facility] Delbert Clam, MD   2 years ago Screening for colon cancer    [facility] [place]. [facility] Delbert Clam, MD

## 2024-08-02 ENCOUNTER — Other Ambulatory Visit: Payer: Self-pay | Admitting: Nurse Practitioner

## 2024-08-23 ENCOUNTER — Other Ambulatory Visit: Payer: Self-pay | Admitting: Nurse Practitioner

## 2024-08-31 ENCOUNTER — Other Ambulatory Visit: Payer: Self-pay | Admitting: Family Medicine

## 2024-10-26 ENCOUNTER — Other Ambulatory Visit: Payer: Self-pay | Admitting: Internal Medicine

## 2024-10-26 DIAGNOSIS — E7800 Pure hypercholesterolemia, unspecified: Secondary | ICD-10-CM

## 2024-11-02 ENCOUNTER — Other Ambulatory Visit: Payer: Self-pay | Admitting: Internal Medicine

## 2024-11-02 DIAGNOSIS — E7800 Pure hypercholesterolemia, unspecified: Secondary | ICD-10-CM

## 2024-11-02 MED ORDER — ATORVASTATIN CALCIUM 40 MG PO TABS: 40.0000 mg | ORAL_TABLET | Freq: Every day | ORAL | 0 refills | Status: AC

## 2024-11-29 ENCOUNTER — Ambulatory Visit: Admitting: Family Medicine
